# Patient Record
Sex: Male | Born: 1960 | Race: White | Hispanic: No | Marital: Married | Smoking: Former smoker
Health system: Southern US, Community
[De-identification: ages and names within clinical notes are randomized; demographics above are authoritative.]

## PROBLEM LIST (undated history)

## (undated) DIAGNOSIS — K589 Irritable bowel syndrome without diarrhea: Secondary | ICD-10-CM

## (undated) DIAGNOSIS — Z860100 Personal history of colon polyps, unspecified: Secondary | ICD-10-CM

## (undated) DIAGNOSIS — K317 Polyp of stomach and duodenum: Secondary | ICD-10-CM

## (undated) DIAGNOSIS — G4730 Sleep apnea, unspecified: Secondary | ICD-10-CM

## (undated) DIAGNOSIS — K645 Perianal venous thrombosis: Secondary | ICD-10-CM

## (undated) DIAGNOSIS — F32A Depression, unspecified: Secondary | ICD-10-CM

## (undated) DIAGNOSIS — T7840XA Allergy, unspecified, initial encounter: Secondary | ICD-10-CM

## (undated) DIAGNOSIS — Z8489 Family history of other specified conditions: Secondary | ICD-10-CM

## (undated) DIAGNOSIS — R519 Headache, unspecified: Secondary | ICD-10-CM

## (undated) DIAGNOSIS — Z5189 Encounter for other specified aftercare: Secondary | ICD-10-CM

## (undated) DIAGNOSIS — J449 Chronic obstructive pulmonary disease, unspecified: Secondary | ICD-10-CM

## (undated) DIAGNOSIS — J40 Bronchitis, not specified as acute or chronic: Secondary | ICD-10-CM

## (undated) DIAGNOSIS — K5790 Diverticulosis of intestine, part unspecified, without perforation or abscess without bleeding: Secondary | ICD-10-CM

## (undated) DIAGNOSIS — K2289 Other specified disease of esophagus: Secondary | ICD-10-CM

## (undated) DIAGNOSIS — K228 Other specified diseases of esophagus: Secondary | ICD-10-CM

## (undated) DIAGNOSIS — K219 Gastro-esophageal reflux disease without esophagitis: Secondary | ICD-10-CM

## (undated) DIAGNOSIS — K229 Disease of esophagus, unspecified: Secondary | ICD-10-CM

## (undated) DIAGNOSIS — K5792 Diverticulitis of intestine, part unspecified, without perforation or abscess without bleeding: Secondary | ICD-10-CM

## (undated) DIAGNOSIS — F909 Attention-deficit hyperactivity disorder, unspecified type: Secondary | ICD-10-CM

## (undated) DIAGNOSIS — F419 Anxiety disorder, unspecified: Secondary | ICD-10-CM

## (undated) DIAGNOSIS — Z8619 Personal history of other infectious and parasitic diseases: Secondary | ICD-10-CM

## (undated) DIAGNOSIS — K222 Esophageal obstruction: Secondary | ICD-10-CM

## (undated) DIAGNOSIS — F329 Major depressive disorder, single episode, unspecified: Secondary | ICD-10-CM

## (undated) DIAGNOSIS — R51 Headache: Secondary | ICD-10-CM

## (undated) DIAGNOSIS — M1990 Unspecified osteoarthritis, unspecified site: Secondary | ICD-10-CM

## (undated) DIAGNOSIS — J45909 Unspecified asthma, uncomplicated: Secondary | ICD-10-CM

## (undated) HISTORY — DX: Esophageal obstruction: K22.2

## (undated) HISTORY — DX: Polyp of stomach and duodenum: K31.7

## (undated) HISTORY — DX: Personal history of colon polyps, unspecified: Z86.0100

## (undated) HISTORY — DX: Allergy, unspecified, initial encounter: T78.40XA

## (undated) HISTORY — PX: CARDIAC CATHETERIZATION: SHX172

## (undated) HISTORY — DX: Other specified disease of esophagus: K22.89

## (undated) HISTORY — DX: Encounter for other specified aftercare: Z51.89

## (undated) HISTORY — DX: Personal history of other infectious and parasitic diseases: Z86.19

## (undated) HISTORY — DX: Perianal venous thrombosis: K64.5

## (undated) HISTORY — PX: UPPER GASTROINTESTINAL ENDOSCOPY: SHX188

## (undated) HISTORY — PX: BACK SURGERY: SHX140

## (undated) HISTORY — PX: NASAL SINUS SURGERY: SHX719

## (undated) HISTORY — PX: COLECTOMY: SHX59

## (undated) HISTORY — PX: CARPAL TUNNEL RELEASE: SHX101

## (undated) HISTORY — DX: Chronic obstructive pulmonary disease, unspecified: J44.9

## (undated) HISTORY — DX: Personal history of colonic polyps: Z86.010

## (undated) HISTORY — PX: KNEE SURGERY: SHX244

## (undated) HISTORY — DX: Unspecified asthma, uncomplicated: J45.909

---
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
## 1999-08-06 ENCOUNTER — Inpatient Hospital Stay (HOSPITAL_COMMUNITY): Admission: AD | Admit: 1999-08-06 | Discharge: 1999-08-08 | Payer: Self-pay | Admitting: Cardiology

## 2015-02-18 DIAGNOSIS — Z6836 Body mass index (BMI) 36.0-36.9, adult: Secondary | ICD-10-CM | POA: Diagnosis not present

## 2015-02-18 DIAGNOSIS — L237 Allergic contact dermatitis due to plants, except food: Secondary | ICD-10-CM | POA: Diagnosis not present

## 2015-03-03 DIAGNOSIS — M5136 Other intervertebral disc degeneration, lumbar region: Secondary | ICD-10-CM | POA: Diagnosis not present

## 2015-03-03 DIAGNOSIS — M4316 Spondylolisthesis, lumbar region: Secondary | ICD-10-CM | POA: Diagnosis not present

## 2015-03-03 DIAGNOSIS — M4696 Unspecified inflammatory spondylopathy, lumbar region: Secondary | ICD-10-CM | POA: Diagnosis not present

## 2015-03-03 DIAGNOSIS — Z6836 Body mass index (BMI) 36.0-36.9, adult: Secondary | ICD-10-CM | POA: Diagnosis not present

## 2015-03-10 DIAGNOSIS — M4316 Spondylolisthesis, lumbar region: Secondary | ICD-10-CM | POA: Diagnosis not present

## 2015-03-10 DIAGNOSIS — M4806 Spinal stenosis, lumbar region: Secondary | ICD-10-CM | POA: Diagnosis not present

## 2015-03-10 DIAGNOSIS — M5136 Other intervertebral disc degeneration, lumbar region: Secondary | ICD-10-CM | POA: Diagnosis not present

## 2015-03-17 DIAGNOSIS — M545 Low back pain: Secondary | ICD-10-CM | POA: Diagnosis not present

## 2015-03-29 DIAGNOSIS — M5127 Other intervertebral disc displacement, lumbosacral region: Secondary | ICD-10-CM | POA: Diagnosis not present

## 2015-03-29 DIAGNOSIS — M4316 Spondylolisthesis, lumbar region: Secondary | ICD-10-CM | POA: Diagnosis not present

## 2015-04-06 DIAGNOSIS — M7542 Impingement syndrome of left shoulder: Secondary | ICD-10-CM | POA: Diagnosis not present

## 2015-04-10 DIAGNOSIS — J208 Acute bronchitis due to other specified organisms: Secondary | ICD-10-CM | POA: Diagnosis not present

## 2015-04-10 DIAGNOSIS — F419 Anxiety disorder, unspecified: Secondary | ICD-10-CM | POA: Diagnosis not present

## 2015-04-23 DIAGNOSIS — M545 Low back pain: Secondary | ICD-10-CM | POA: Diagnosis not present

## 2015-04-26 DIAGNOSIS — M47896 Other spondylosis, lumbar region: Secondary | ICD-10-CM | POA: Diagnosis not present

## 2015-04-26 DIAGNOSIS — M4316 Spondylolisthesis, lumbar region: Secondary | ICD-10-CM | POA: Diagnosis not present

## 2015-04-26 DIAGNOSIS — M545 Low back pain: Secondary | ICD-10-CM | POA: Diagnosis not present

## 2015-04-26 DIAGNOSIS — M5136 Other intervertebral disc degeneration, lumbar region: Secondary | ICD-10-CM | POA: Diagnosis not present

## 2015-04-30 DIAGNOSIS — M5136 Other intervertebral disc degeneration, lumbar region: Secondary | ICD-10-CM | POA: Diagnosis not present

## 2015-04-30 DIAGNOSIS — M4316 Spondylolisthesis, lumbar region: Secondary | ICD-10-CM | POA: Diagnosis not present

## 2015-04-30 DIAGNOSIS — Z6837 Body mass index (BMI) 37.0-37.9, adult: Secondary | ICD-10-CM | POA: Diagnosis not present

## 2015-05-04 DIAGNOSIS — M545 Low back pain: Secondary | ICD-10-CM | POA: Diagnosis not present

## 2015-05-04 DIAGNOSIS — M5127 Other intervertebral disc displacement, lumbosacral region: Secondary | ICD-10-CM | POA: Diagnosis not present

## 2015-05-04 DIAGNOSIS — M4806 Spinal stenosis, lumbar region: Secondary | ICD-10-CM | POA: Diagnosis not present

## 2015-05-04 DIAGNOSIS — M4316 Spondylolisthesis, lumbar region: Secondary | ICD-10-CM | POA: Diagnosis not present

## 2015-05-06 ENCOUNTER — Other Ambulatory Visit: Payer: Self-pay | Admitting: Specialist

## 2015-05-06 DIAGNOSIS — M48061 Spinal stenosis, lumbar region without neurogenic claudication: Secondary | ICD-10-CM

## 2015-05-12 DIAGNOSIS — S301XXA Contusion of abdominal wall, initial encounter: Secondary | ICD-10-CM | POA: Diagnosis not present

## 2015-05-12 DIAGNOSIS — Z6837 Body mass index (BMI) 37.0-37.9, adult: Secondary | ICD-10-CM | POA: Diagnosis not present

## 2015-05-13 ENCOUNTER — Ambulatory Visit
Admission: RE | Admit: 2015-05-13 | Discharge: 2015-05-13 | Disposition: A | Discharge status: Home / Self Care | Payer: Medicare Other | Source: Ambulatory Visit | Attending: Specialist | Admitting: Specialist

## 2015-05-13 DIAGNOSIS — M48061 Spinal stenosis, lumbar region without neurogenic claudication: Secondary | ICD-10-CM

## 2015-05-13 DIAGNOSIS — M4317 Spondylolisthesis, lumbosacral region: Secondary | ICD-10-CM | POA: Diagnosis not present

## 2015-05-13 DIAGNOSIS — M4806 Spinal stenosis, lumbar region: Secondary | ICD-10-CM | POA: Diagnosis not present

## 2015-05-13 DIAGNOSIS — M5137 Other intervertebral disc degeneration, lumbosacral region: Secondary | ICD-10-CM | POA: Diagnosis not present

## 2015-05-13 DIAGNOSIS — M5126 Other intervertebral disc displacement, lumbar region: Secondary | ICD-10-CM | POA: Diagnosis not present

## 2015-05-13 MED ORDER — ONDANSETRON HCL 4 MG/2ML IJ SOLN
4.0000 mg | Freq: Once | INTRAMUSCULAR | Status: AC
  Administered 2015-05-13: 4 mg via INTRAMUSCULAR

## 2015-05-13 MED ORDER — MEPERIDINE HCL 100 MG/ML IJ SOLN
75.0000 mg | Freq: Once | INTRAMUSCULAR | Status: AC
  Administered 2015-05-13: 75 mg via INTRAMUSCULAR

## 2015-05-13 MED ORDER — IOHEXOL 180 MG/ML  SOLN
15.0000 mL | Freq: Once | INTRAMUSCULAR | Status: AC | PRN
Start: 2015-05-13 — End: 2015-05-13
  Administered 2015-05-13: 15 mL via INTRATHECAL

## 2015-05-13 MED ORDER — DIAZEPAM 5 MG PO TABS
10.0000 mg | ORAL_TABLET | Freq: Once | ORAL | Status: AC
  Administered 2015-05-13: 5 mg via ORAL

## 2015-05-13 NOTE — Discharge Instructions (Signed)

## 2015-05-21 DIAGNOSIS — M5127 Other intervertebral disc displacement, lumbosacral region: Secondary | ICD-10-CM | POA: Diagnosis not present

## 2015-05-21 DIAGNOSIS — M4316 Spondylolisthesis, lumbar region: Secondary | ICD-10-CM | POA: Diagnosis not present

## 2015-06-03 DIAGNOSIS — M4806 Spinal stenosis, lumbar region: Secondary | ICD-10-CM | POA: Diagnosis not present

## 2015-06-03 DIAGNOSIS — M545 Low back pain: Secondary | ICD-10-CM | POA: Diagnosis not present

## 2015-06-03 DIAGNOSIS — M47897 Other spondylosis, lumbosacral region: Secondary | ICD-10-CM | POA: Diagnosis not present

## 2015-06-03 DIAGNOSIS — M47817 Spondylosis without myelopathy or radiculopathy, lumbosacral region: Secondary | ICD-10-CM | POA: Diagnosis not present

## 2015-06-14 DIAGNOSIS — M4316 Spondylolisthesis, lumbar region: Secondary | ICD-10-CM | POA: Diagnosis not present

## 2015-06-14 DIAGNOSIS — M4806 Spinal stenosis, lumbar region: Secondary | ICD-10-CM | POA: Diagnosis not present

## 2015-06-14 DIAGNOSIS — M5127 Other intervertebral disc displacement, lumbosacral region: Secondary | ICD-10-CM | POA: Diagnosis not present

## 2015-06-18 DIAGNOSIS — Z6839 Body mass index (BMI) 39.0-39.9, adult: Secondary | ICD-10-CM | POA: Diagnosis not present

## 2015-06-18 DIAGNOSIS — M4316 Spondylolisthesis, lumbar region: Secondary | ICD-10-CM | POA: Diagnosis not present

## 2015-06-18 DIAGNOSIS — M5136 Other intervertebral disc degeneration, lumbar region: Secondary | ICD-10-CM | POA: Diagnosis not present

## 2015-06-21 DIAGNOSIS — M4316 Spondylolisthesis, lumbar region: Secondary | ICD-10-CM | POA: Diagnosis not present

## 2015-06-21 DIAGNOSIS — M5136 Other intervertebral disc degeneration, lumbar region: Secondary | ICD-10-CM | POA: Diagnosis not present

## 2015-06-23 DIAGNOSIS — Z6839 Body mass index (BMI) 39.0-39.9, adult: Secondary | ICD-10-CM | POA: Diagnosis not present

## 2015-06-23 DIAGNOSIS — M4316 Spondylolisthesis, lumbar region: Secondary | ICD-10-CM | POA: Diagnosis not present

## 2015-06-29 DIAGNOSIS — Z01812 Encounter for preprocedural laboratory examination: Secondary | ICD-10-CM | POA: Diagnosis not present

## 2015-07-12 DIAGNOSIS — Z01810 Encounter for preprocedural cardiovascular examination: Secondary | ICD-10-CM | POA: Diagnosis not present

## 2015-07-12 DIAGNOSIS — M5442 Lumbago with sciatica, left side: Secondary | ICD-10-CM | POA: Diagnosis not present

## 2015-07-12 DIAGNOSIS — M79604 Pain in right leg: Secondary | ICD-10-CM | POA: Diagnosis not present

## 2015-07-12 DIAGNOSIS — M545 Low back pain: Secondary | ICD-10-CM | POA: Diagnosis not present

## 2015-07-12 DIAGNOSIS — M4806 Spinal stenosis, lumbar region: Secondary | ICD-10-CM | POA: Diagnosis not present

## 2015-07-12 DIAGNOSIS — M1612 Unilateral primary osteoarthritis, left hip: Secondary | ICD-10-CM | POA: Diagnosis not present

## 2015-07-12 DIAGNOSIS — M5441 Lumbago with sciatica, right side: Secondary | ICD-10-CM | POA: Diagnosis not present

## 2015-07-12 DIAGNOSIS — M1611 Unilateral primary osteoarthritis, right hip: Secondary | ICD-10-CM | POA: Diagnosis not present

## 2015-07-12 DIAGNOSIS — M5416 Radiculopathy, lumbar region: Secondary | ICD-10-CM | POA: Diagnosis not present

## 2015-07-12 DIAGNOSIS — M4317 Spondylolisthesis, lumbosacral region: Secondary | ICD-10-CM | POA: Diagnosis not present

## 2015-07-12 DIAGNOSIS — G4730 Sleep apnea, unspecified: Secondary | ICD-10-CM | POA: Diagnosis not present

## 2015-07-12 DIAGNOSIS — Z01812 Encounter for preprocedural laboratory examination: Secondary | ICD-10-CM | POA: Diagnosis not present

## 2015-07-12 DIAGNOSIS — Z01818 Encounter for other preprocedural examination: Secondary | ICD-10-CM | POA: Diagnosis not present

## 2015-07-13 DIAGNOSIS — M4317 Spondylolisthesis, lumbosacral region: Secondary | ICD-10-CM | POA: Diagnosis not present

## 2015-07-13 DIAGNOSIS — M5417 Radiculopathy, lumbosacral region: Secondary | ICD-10-CM | POA: Diagnosis not present

## 2015-07-15 DIAGNOSIS — M1288 Other specific arthropathies, not elsewhere classified, other specified site: Secondary | ICD-10-CM | POA: Diagnosis not present

## 2015-07-15 DIAGNOSIS — Z6839 Body mass index (BMI) 39.0-39.9, adult: Secondary | ICD-10-CM | POA: Diagnosis not present

## 2015-07-15 DIAGNOSIS — M4316 Spondylolisthesis, lumbar region: Secondary | ICD-10-CM | POA: Diagnosis not present

## 2015-07-15 DIAGNOSIS — M5136 Other intervertebral disc degeneration, lumbar region: Secondary | ICD-10-CM | POA: Diagnosis not present

## 2015-07-28 DIAGNOSIS — M4316 Spondylolisthesis, lumbar region: Secondary | ICD-10-CM | POA: Diagnosis not present

## 2015-07-28 DIAGNOSIS — M4300 Spondylolysis, site unspecified: Secondary | ICD-10-CM | POA: Diagnosis not present

## 2015-07-28 DIAGNOSIS — M4806 Spinal stenosis, lumbar region: Secondary | ICD-10-CM | POA: Diagnosis not present

## 2015-07-28 DIAGNOSIS — M4310 Spondylolisthesis, site unspecified: Secondary | ICD-10-CM | POA: Diagnosis not present

## 2015-07-28 HISTORY — DX: Morbid (severe) obesity due to excess calories: E66.01

## 2015-08-09 DIAGNOSIS — M47816 Spondylosis without myelopathy or radiculopathy, lumbar region: Secondary | ICD-10-CM | POA: Diagnosis not present

## 2015-08-09 DIAGNOSIS — M4806 Spinal stenosis, lumbar region: Secondary | ICD-10-CM | POA: Diagnosis not present

## 2015-08-09 DIAGNOSIS — M4317 Spondylolisthesis, lumbosacral region: Secondary | ICD-10-CM | POA: Diagnosis not present

## 2015-08-18 DIAGNOSIS — K5792 Diverticulitis of intestine, part unspecified, without perforation or abscess without bleeding: Secondary | ICD-10-CM | POA: Diagnosis not present

## 2015-08-18 DIAGNOSIS — J019 Acute sinusitis, unspecified: Secondary | ICD-10-CM | POA: Diagnosis not present

## 2015-08-18 DIAGNOSIS — R110 Nausea: Secondary | ICD-10-CM | POA: Diagnosis not present

## 2015-08-18 DIAGNOSIS — Z6838 Body mass index (BMI) 38.0-38.9, adult: Secondary | ICD-10-CM | POA: Diagnosis not present

## 2015-08-19 DIAGNOSIS — F419 Anxiety disorder, unspecified: Secondary | ICD-10-CM | POA: Diagnosis not present

## 2015-08-19 DIAGNOSIS — M1990 Unspecified osteoarthritis, unspecified site: Secondary | ICD-10-CM | POA: Diagnosis not present

## 2015-08-19 DIAGNOSIS — J069 Acute upper respiratory infection, unspecified: Secondary | ICD-10-CM | POA: Diagnosis not present

## 2015-08-19 DIAGNOSIS — Z6838 Body mass index (BMI) 38.0-38.9, adult: Secondary | ICD-10-CM | POA: Diagnosis not present

## 2015-08-19 DIAGNOSIS — M4316 Spondylolisthesis, lumbar region: Secondary | ICD-10-CM | POA: Diagnosis not present

## 2015-08-19 DIAGNOSIS — M4317 Spondylolisthesis, lumbosacral region: Secondary | ICD-10-CM | POA: Diagnosis not present

## 2015-08-19 DIAGNOSIS — M4306 Spondylolysis, lumbar region: Secondary | ICD-10-CM | POA: Diagnosis not present

## 2015-08-19 DIAGNOSIS — Z87891 Personal history of nicotine dependence: Secondary | ICD-10-CM | POA: Diagnosis not present

## 2015-08-19 DIAGNOSIS — K219 Gastro-esophageal reflux disease without esophagitis: Secondary | ICD-10-CM | POA: Diagnosis not present

## 2015-09-01 DIAGNOSIS — Z9889 Other specified postprocedural states: Secondary | ICD-10-CM | POA: Diagnosis not present

## 2015-09-01 DIAGNOSIS — Z981 Arthrodesis status: Secondary | ICD-10-CM | POA: Diagnosis not present

## 2015-09-01 DIAGNOSIS — Z79899 Other long term (current) drug therapy: Secondary | ICD-10-CM | POA: Diagnosis not present

## 2015-09-01 DIAGNOSIS — Z09 Encounter for follow-up examination after completed treatment for conditions other than malignant neoplasm: Secondary | ICD-10-CM | POA: Diagnosis not present

## 2015-09-01 DIAGNOSIS — K219 Gastro-esophageal reflux disease without esophagitis: Secondary | ICD-10-CM | POA: Diagnosis not present

## 2015-09-11 DIAGNOSIS — Z23 Encounter for immunization: Secondary | ICD-10-CM | POA: Diagnosis not present

## 2015-09-11 DIAGNOSIS — Z131 Encounter for screening for diabetes mellitus: Secondary | ICD-10-CM | POA: Diagnosis not present

## 2015-09-11 DIAGNOSIS — Z136 Encounter for screening for cardiovascular disorders: Secondary | ICD-10-CM | POA: Diagnosis not present

## 2015-09-11 DIAGNOSIS — Z125 Encounter for screening for malignant neoplasm of prostate: Secondary | ICD-10-CM | POA: Diagnosis not present

## 2015-09-11 DIAGNOSIS — Z0000 Encounter for general adult medical examination without abnormal findings: Secondary | ICD-10-CM | POA: Diagnosis not present

## 2015-09-16 DIAGNOSIS — Z4789 Encounter for other orthopedic aftercare: Secondary | ICD-10-CM | POA: Diagnosis not present

## 2015-09-16 DIAGNOSIS — Z981 Arthrodesis status: Secondary | ICD-10-CM | POA: Diagnosis not present

## 2015-09-16 DIAGNOSIS — M439 Deforming dorsopathy, unspecified: Secondary | ICD-10-CM | POA: Diagnosis not present

## 2015-09-16 DIAGNOSIS — M47816 Spondylosis without myelopathy or radiculopathy, lumbar region: Secondary | ICD-10-CM | POA: Diagnosis not present

## 2015-09-16 DIAGNOSIS — M4806 Spinal stenosis, lumbar region: Secondary | ICD-10-CM | POA: Diagnosis not present

## 2015-09-29 DIAGNOSIS — Z9889 Other specified postprocedural states: Secondary | ICD-10-CM | POA: Diagnosis not present

## 2015-09-29 DIAGNOSIS — M4310 Spondylolisthesis, site unspecified: Secondary | ICD-10-CM | POA: Diagnosis not present

## 2015-09-29 DIAGNOSIS — M4806 Spinal stenosis, lumbar region: Secondary | ICD-10-CM | POA: Diagnosis not present

## 2015-09-29 DIAGNOSIS — Z981 Arthrodesis status: Secondary | ICD-10-CM | POA: Diagnosis not present

## 2015-09-29 DIAGNOSIS — Z79899 Other long term (current) drug therapy: Secondary | ICD-10-CM | POA: Diagnosis not present

## 2015-10-06 DIAGNOSIS — G4733 Obstructive sleep apnea (adult) (pediatric): Secondary | ICD-10-CM | POA: Insufficient documentation

## 2015-10-06 DIAGNOSIS — K219 Gastro-esophageal reflux disease without esophagitis: Secondary | ICD-10-CM | POA: Insufficient documentation

## 2015-10-06 DIAGNOSIS — M4806 Spinal stenosis, lumbar region: Secondary | ICD-10-CM | POA: Diagnosis not present

## 2015-10-06 DIAGNOSIS — J45909 Unspecified asthma, uncomplicated: Secondary | ICD-10-CM | POA: Insufficient documentation

## 2015-10-06 HISTORY — DX: Obstructive sleep apnea (adult) (pediatric): G47.33

## 2015-10-08 DIAGNOSIS — M4806 Spinal stenosis, lumbar region: Secondary | ICD-10-CM | POA: Diagnosis not present

## 2015-10-08 HISTORY — PX: LUMBAR LAMINECTOMY: SHX95

## 2015-10-11 DIAGNOSIS — Z6839 Body mass index (BMI) 39.0-39.9, adult: Secondary | ICD-10-CM | POA: Diagnosis not present

## 2015-10-11 DIAGNOSIS — B356 Tinea cruris: Secondary | ICD-10-CM | POA: Diagnosis not present

## 2015-10-21 DIAGNOSIS — Z9889 Other specified postprocedural states: Secondary | ICD-10-CM | POA: Diagnosis not present

## 2015-10-21 DIAGNOSIS — Z4789 Encounter for other orthopedic aftercare: Secondary | ICD-10-CM | POA: Diagnosis not present

## 2015-10-21 DIAGNOSIS — Z981 Arthrodesis status: Secondary | ICD-10-CM | POA: Diagnosis not present

## 2015-11-01 DIAGNOSIS — Z6839 Body mass index (BMI) 39.0-39.9, adult: Secondary | ICD-10-CM | POA: Diagnosis not present

## 2015-11-01 DIAGNOSIS — E669 Obesity, unspecified: Secondary | ICD-10-CM | POA: Diagnosis not present

## 2015-11-01 DIAGNOSIS — J019 Acute sinusitis, unspecified: Secondary | ICD-10-CM | POA: Diagnosis not present

## 2015-11-01 DIAGNOSIS — Z9889 Other specified postprocedural states: Secondary | ICD-10-CM

## 2015-11-01 DIAGNOSIS — L300 Nummular dermatitis: Secondary | ICD-10-CM | POA: Diagnosis not present

## 2015-11-01 DIAGNOSIS — B86 Scabies: Secondary | ICD-10-CM | POA: Diagnosis not present

## 2015-11-01 DIAGNOSIS — L299 Pruritus, unspecified: Secondary | ICD-10-CM | POA: Diagnosis not present

## 2015-11-01 DIAGNOSIS — R21 Rash and other nonspecific skin eruption: Secondary | ICD-10-CM | POA: Diagnosis not present

## 2015-11-01 HISTORY — DX: Other specified postprocedural states: Z98.890

## 2015-11-24 DIAGNOSIS — Z79899 Other long term (current) drug therapy: Secondary | ICD-10-CM | POA: Diagnosis not present

## 2015-11-24 DIAGNOSIS — Z981 Arthrodesis status: Secondary | ICD-10-CM | POA: Diagnosis not present

## 2015-11-24 DIAGNOSIS — Z9889 Other specified postprocedural states: Secondary | ICD-10-CM | POA: Diagnosis not present

## 2015-11-24 DIAGNOSIS — M4317 Spondylolisthesis, lumbosacral region: Secondary | ICD-10-CM | POA: Diagnosis not present

## 2015-11-24 DIAGNOSIS — W19XXXD Unspecified fall, subsequent encounter: Secondary | ICD-10-CM | POA: Diagnosis not present

## 2015-11-24 DIAGNOSIS — M47896 Other spondylosis, lumbar region: Secondary | ICD-10-CM | POA: Diagnosis not present

## 2015-11-24 DIAGNOSIS — M4806 Spinal stenosis, lumbar region: Secondary | ICD-10-CM | POA: Diagnosis not present

## 2015-11-24 DIAGNOSIS — Z4789 Encounter for other orthopedic aftercare: Secondary | ICD-10-CM | POA: Diagnosis not present

## 2015-12-29 DIAGNOSIS — Z981 Arthrodesis status: Secondary | ICD-10-CM | POA: Diagnosis not present

## 2015-12-29 DIAGNOSIS — M545 Low back pain: Secondary | ICD-10-CM | POA: Diagnosis not present

## 2015-12-29 DIAGNOSIS — G8929 Other chronic pain: Secondary | ICD-10-CM | POA: Diagnosis not present

## 2015-12-29 DIAGNOSIS — Z9889 Other specified postprocedural states: Secondary | ICD-10-CM | POA: Diagnosis not present

## 2015-12-29 DIAGNOSIS — Z791 Long term (current) use of non-steroidal anti-inflammatories (NSAID): Secondary | ICD-10-CM | POA: Diagnosis not present

## 2016-01-10 DIAGNOSIS — G8929 Other chronic pain: Secondary | ICD-10-CM | POA: Insufficient documentation

## 2016-01-10 DIAGNOSIS — M533 Sacrococcygeal disorders, not elsewhere classified: Secondary | ICD-10-CM | POA: Diagnosis not present

## 2016-01-10 DIAGNOSIS — M545 Low back pain, unspecified: Secondary | ICD-10-CM | POA: Insufficient documentation

## 2016-01-10 DIAGNOSIS — W19XXXA Unspecified fall, initial encounter: Secondary | ICD-10-CM

## 2016-01-10 DIAGNOSIS — M461 Sacroiliitis, not elsewhere classified: Secondary | ICD-10-CM | POA: Diagnosis not present

## 2016-01-10 HISTORY — DX: Other chronic pain: G89.29

## 2016-01-10 HISTORY — DX: Low back pain, unspecified: M54.50

## 2016-01-10 HISTORY — DX: Unspecified fall, initial encounter: W19.XXXA

## 2016-01-10 HISTORY — PX: SACROILIAC JOINT INJECTION: SHX2370

## 2016-01-17 DIAGNOSIS — J019 Acute sinusitis, unspecified: Secondary | ICD-10-CM | POA: Diagnosis not present

## 2016-01-20 DIAGNOSIS — Z79899 Other long term (current) drug therapy: Secondary | ICD-10-CM | POA: Diagnosis not present

## 2016-01-20 DIAGNOSIS — G894 Chronic pain syndrome: Secondary | ICD-10-CM | POA: Diagnosis not present

## 2016-01-20 DIAGNOSIS — Z981 Arthrodesis status: Secondary | ICD-10-CM | POA: Diagnosis not present

## 2016-01-20 DIAGNOSIS — M461 Sacroiliitis, not elsewhere classified: Secondary | ICD-10-CM | POA: Diagnosis not present

## 2016-01-20 DIAGNOSIS — M791 Myalgia: Secondary | ICD-10-CM | POA: Diagnosis not present

## 2016-01-20 DIAGNOSIS — Z791 Long term (current) use of non-steroidal anti-inflammatories (NSAID): Secondary | ICD-10-CM | POA: Diagnosis not present

## 2016-02-21 DIAGNOSIS — M4300 Spondylolysis, site unspecified: Secondary | ICD-10-CM | POA: Diagnosis not present

## 2016-02-21 DIAGNOSIS — M4806 Spinal stenosis, lumbar region: Secondary | ICD-10-CM | POA: Diagnosis not present

## 2016-02-21 DIAGNOSIS — Z9889 Other specified postprocedural states: Secondary | ICD-10-CM | POA: Diagnosis not present

## 2016-02-21 DIAGNOSIS — M4310 Spondylolisthesis, site unspecified: Secondary | ICD-10-CM | POA: Diagnosis not present

## 2016-02-21 DIAGNOSIS — Z981 Arthrodesis status: Secondary | ICD-10-CM | POA: Diagnosis not present

## 2016-02-21 DIAGNOSIS — Z79899 Other long term (current) drug therapy: Secondary | ICD-10-CM | POA: Diagnosis not present

## 2016-03-28 DIAGNOSIS — M5442 Lumbago with sciatica, left side: Secondary | ICD-10-CM | POA: Diagnosis not present

## 2016-03-28 DIAGNOSIS — S22080A Wedge compression fracture of T11-T12 vertebra, initial encounter for closed fracture: Secondary | ICD-10-CM | POA: Diagnosis not present

## 2016-03-28 DIAGNOSIS — M4806 Spinal stenosis, lumbar region: Secondary | ICD-10-CM | POA: Diagnosis not present

## 2016-03-28 DIAGNOSIS — M5441 Lumbago with sciatica, right side: Secondary | ICD-10-CM | POA: Diagnosis not present

## 2016-03-28 DIAGNOSIS — M4317 Spondylolisthesis, lumbosacral region: Secondary | ICD-10-CM | POA: Diagnosis not present

## 2016-03-28 DIAGNOSIS — M4300 Spondylolysis, site unspecified: Secondary | ICD-10-CM | POA: Diagnosis not present

## 2016-04-06 DIAGNOSIS — G894 Chronic pain syndrome: Secondary | ICD-10-CM | POA: Diagnosis not present

## 2016-04-06 DIAGNOSIS — M545 Low back pain: Secondary | ICD-10-CM | POA: Diagnosis not present

## 2016-04-06 DIAGNOSIS — Z9889 Other specified postprocedural states: Secondary | ICD-10-CM | POA: Diagnosis not present

## 2016-04-06 DIAGNOSIS — M461 Sacroiliitis, not elsewhere classified: Secondary | ICD-10-CM | POA: Diagnosis not present

## 2016-04-06 DIAGNOSIS — Z981 Arthrodesis status: Secondary | ICD-10-CM | POA: Diagnosis not present

## 2016-04-12 DIAGNOSIS — R112 Nausea with vomiting, unspecified: Secondary | ICD-10-CM | POA: Diagnosis not present

## 2016-04-12 DIAGNOSIS — R079 Chest pain, unspecified: Secondary | ICD-10-CM | POA: Diagnosis not present

## 2016-04-12 DIAGNOSIS — R319 Hematuria, unspecified: Secondary | ICD-10-CM | POA: Diagnosis not present

## 2016-04-12 DIAGNOSIS — R197 Diarrhea, unspecified: Secondary | ICD-10-CM | POA: Diagnosis not present

## 2016-04-13 DIAGNOSIS — K219 Gastro-esophageal reflux disease without esophagitis: Secondary | ICD-10-CM | POA: Diagnosis not present

## 2016-04-13 DIAGNOSIS — J019 Acute sinusitis, unspecified: Secondary | ICD-10-CM | POA: Diagnosis not present

## 2016-04-13 DIAGNOSIS — R1314 Dysphagia, pharyngoesophageal phase: Secondary | ICD-10-CM | POA: Diagnosis not present

## 2016-04-13 DIAGNOSIS — J208 Acute bronchitis due to other specified organisms: Secondary | ICD-10-CM | POA: Diagnosis not present

## 2016-04-14 DIAGNOSIS — R109 Unspecified abdominal pain: Secondary | ICD-10-CM | POA: Diagnosis not present

## 2016-04-14 DIAGNOSIS — T18128A Food in esophagus causing other injury, initial encounter: Secondary | ICD-10-CM | POA: Diagnosis not present

## 2016-04-14 DIAGNOSIS — R112 Nausea with vomiting, unspecified: Secondary | ICD-10-CM | POA: Diagnosis not present

## 2016-04-14 DIAGNOSIS — K222 Esophageal obstruction: Secondary | ICD-10-CM | POA: Diagnosis not present

## 2016-04-14 DIAGNOSIS — R1314 Dysphagia, pharyngoesophageal phase: Secondary | ICD-10-CM | POA: Diagnosis not present

## 2016-04-15 DIAGNOSIS — T18128A Food in esophagus causing other injury, initial encounter: Secondary | ICD-10-CM | POA: Diagnosis not present

## 2016-04-15 DIAGNOSIS — K222 Esophageal obstruction: Secondary | ICD-10-CM | POA: Diagnosis not present

## 2016-04-15 DIAGNOSIS — R1314 Dysphagia, pharyngoesophageal phase: Secondary | ICD-10-CM | POA: Diagnosis not present

## 2016-04-15 DIAGNOSIS — R112 Nausea with vomiting, unspecified: Secondary | ICD-10-CM | POA: Diagnosis not present

## 2016-04-20 DIAGNOSIS — K222 Esophageal obstruction: Secondary | ICD-10-CM | POA: Diagnosis not present

## 2016-04-20 DIAGNOSIS — T18128A Food in esophagus causing other injury, initial encounter: Secondary | ICD-10-CM | POA: Diagnosis not present

## 2016-04-20 DIAGNOSIS — K580 Irritable bowel syndrome with diarrhea: Secondary | ICD-10-CM | POA: Diagnosis not present

## 2016-04-20 DIAGNOSIS — R1314 Dysphagia, pharyngoesophageal phase: Secondary | ICD-10-CM | POA: Diagnosis not present

## 2016-04-25 DIAGNOSIS — R1314 Dysphagia, pharyngoesophageal phase: Secondary | ICD-10-CM | POA: Diagnosis not present

## 2016-04-25 DIAGNOSIS — R1310 Dysphagia, unspecified: Secondary | ICD-10-CM | POA: Diagnosis not present

## 2016-04-25 DIAGNOSIS — K219 Gastro-esophageal reflux disease without esophagitis: Secondary | ICD-10-CM | POA: Diagnosis not present

## 2016-05-01 DIAGNOSIS — M47817 Spondylosis without myelopathy or radiculopathy, lumbosacral region: Secondary | ICD-10-CM | POA: Diagnosis not present

## 2016-05-01 DIAGNOSIS — M461 Sacroiliitis, not elsewhere classified: Secondary | ICD-10-CM | POA: Diagnosis not present

## 2016-05-04 IMAGING — CT CT L SPINE W/ CM
3 series · 9 of 33 positions shown, 11 images · non-contrast
Comparison: MRI 03/10/2015.

CLINICAL DATA: L3-L4 spinal stenosis. Spondylolisthesis. Chronic
back pain. Failure of conservative management.
TECHNIQUE: Contiguous axial images were obtained through the Lumbar spine after
the intrathecal infusion of infusion. Coronal and sagittal
reconstructions were obtained of the axial image sets.

[Series 3: l spine 3.0 b41s · axial · 0.30mm/px · z∈[-102,-102]mm · 1 of 82 slices shown, 2 images]
[im 44/82  soft-tissue]
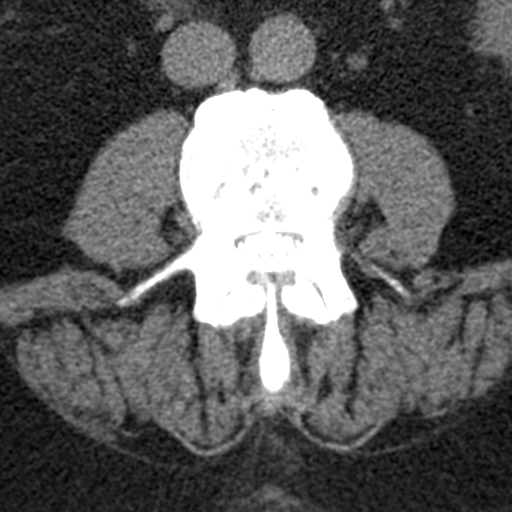
[im 44/82  bone]
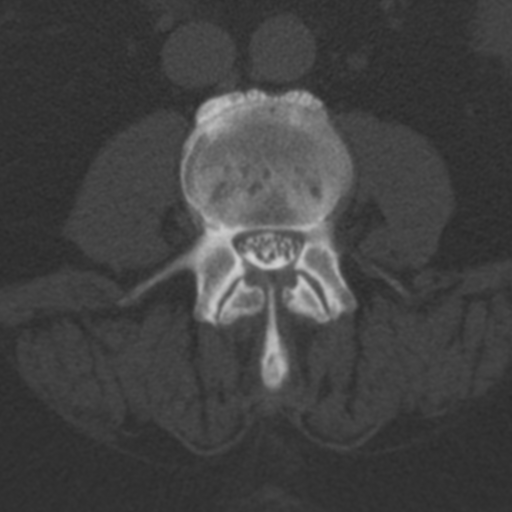

[Series 7: l spine bone cor · coronal · 0.31mm/px · 3 of 47 slices shown]
[im 10/47  bone]
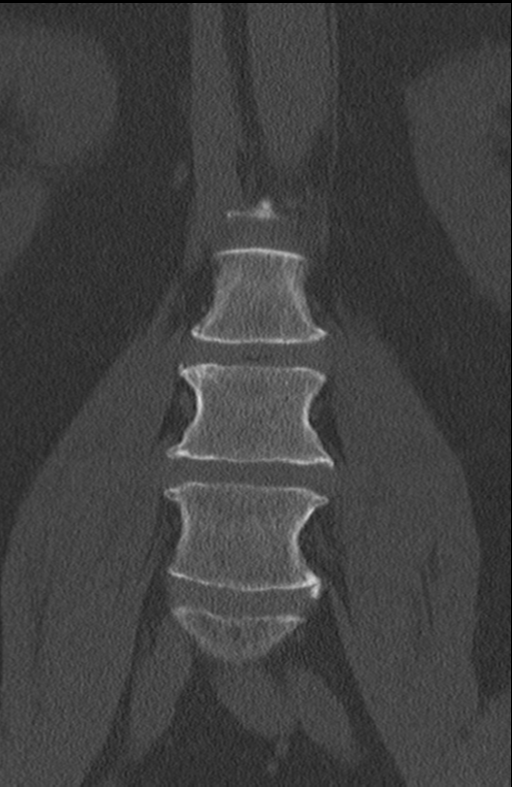
[im 19/47  bone]
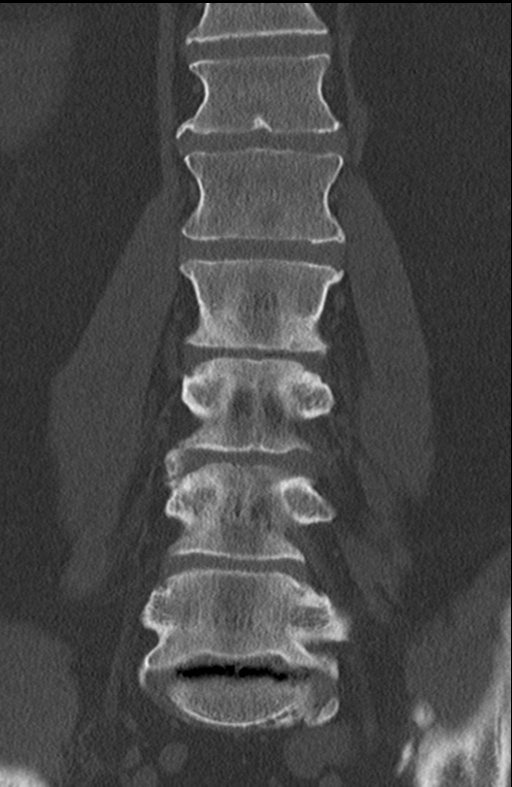
[im 28/47  bone]
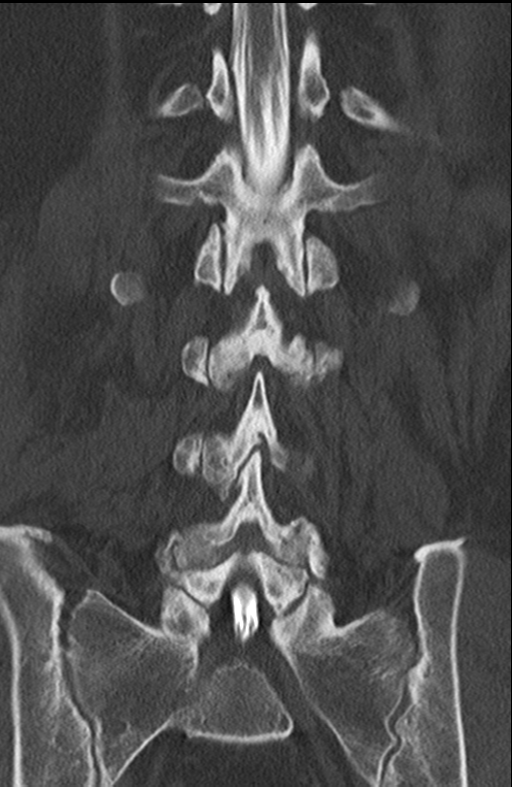

[Series 8: l spine sag bone sag · sagittal · 0.31mm/px · 5 of 50 slices shown, 6 images]
[im 17/50  bone]
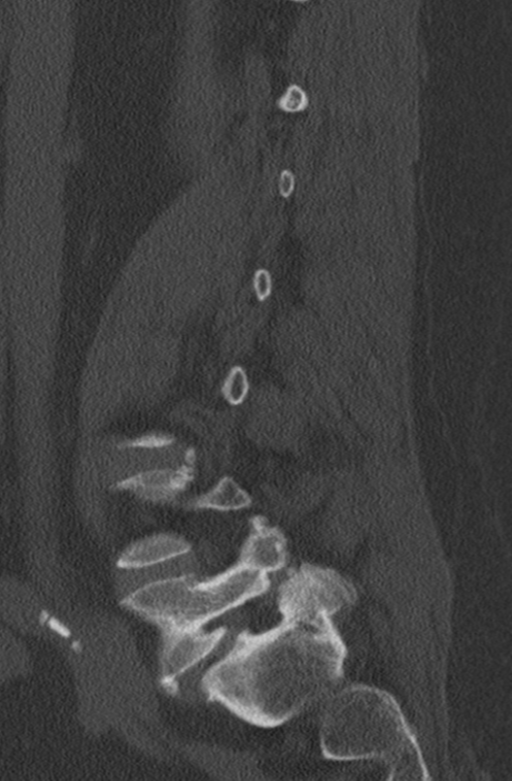
[im 21/50  bone]
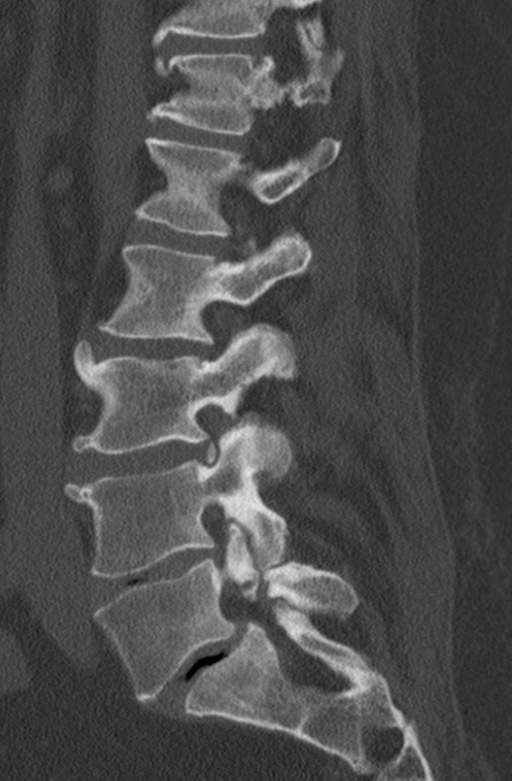
[im 25/50  soft-tissue]
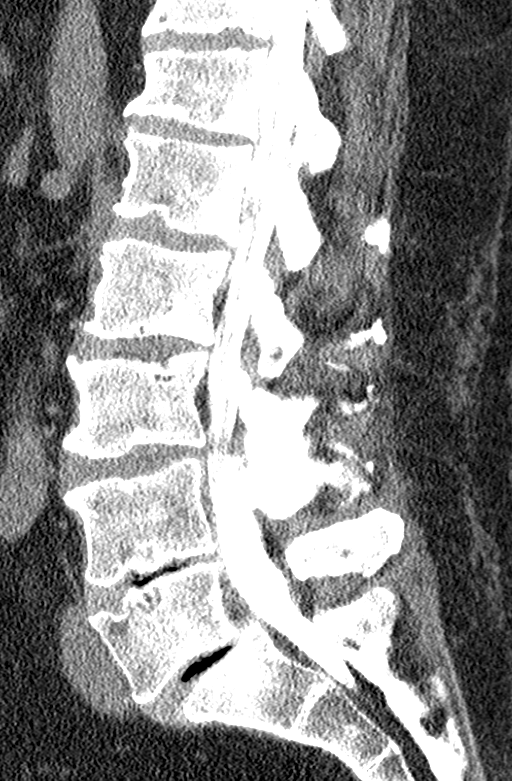
[im 25/50  bone]
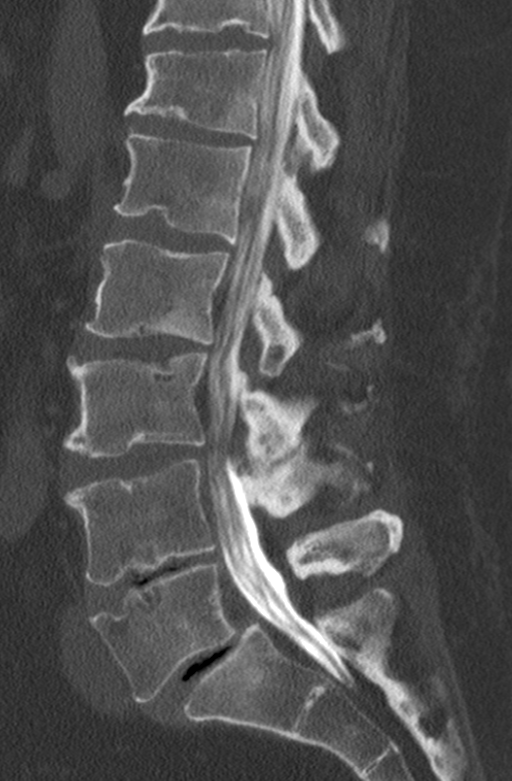
[im 29/50  bone]
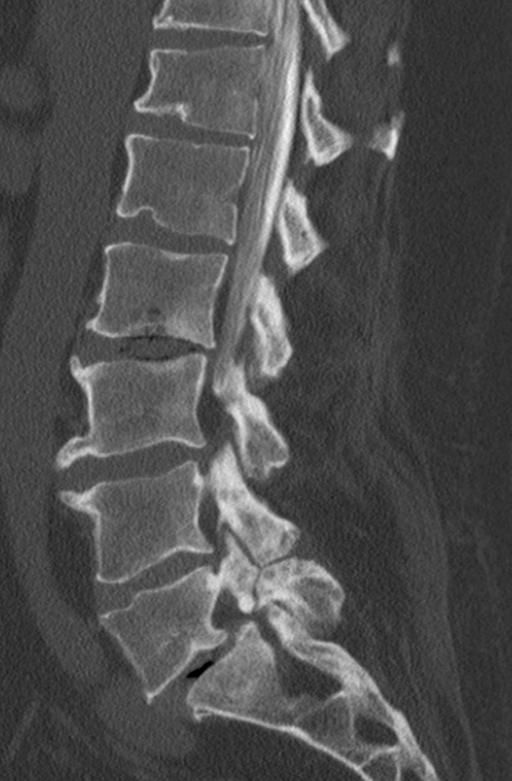
[im 33/50  bone]
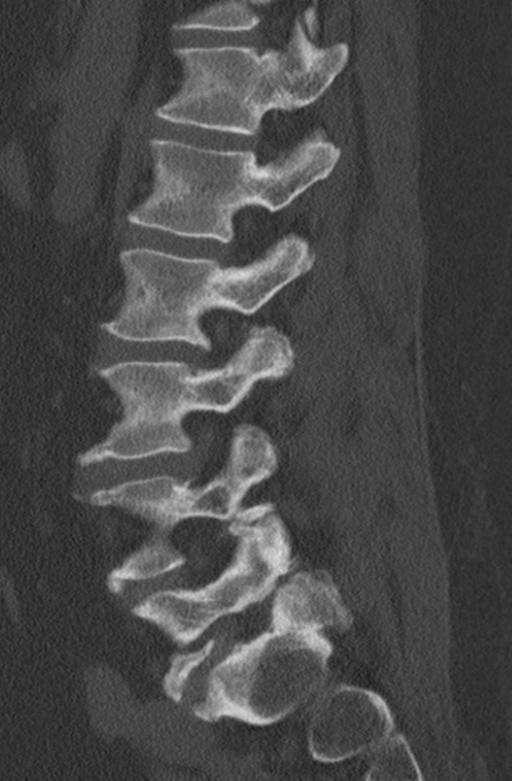

[9 of 33 positions shown; findings below may reference images not displayed]

EXAM:
LUMBAR MYELOGRAM

FLUOROSCOPY TIME:  1 minutes 2 seconds

14 fluoroscopic spot films were obtained.

PROCEDURE:
After thorough discussion of risks and benefits of the procedure
including bleeding, infection, injury to nerves, blood vessels,
adjacent structures as well as headache and CSF leak, written and
oral informed consent was obtained. Consent was obtained by Dr.
Baldev Bevins. Time out form was completed.

Patient was positioned prone on the fluoroscopy table. Local
anesthesia was provided with 1% lidocaine without epinephrine after
prepped and draped in the usual sterile fashion. Puncture was
performed at L1-L2 using a 3 1/2 inch 22 gauge pencil point Kajul
spinal needle via RIGHT paramedian approach. Using a single pass
through the dura, the needle was placed within the thecal sac, with
return of clear CSF. 15 mL of Hmnipaque-LTY was injected into the
thecal sac, with normal opacification of the nerve roots and cauda
equina consistent with free flow within the subarachnoid space.

I personally performed the lumbar puncture and administered the
intrathecal contrast. I also personally supervised acquisition of
the myelogram images.
FINDINGS: LUMBAR MYELOGRAM FINDINGS:

Chronic bilateral L5 pars defects are present. This accounts for
grade I anterolisthesis of L5 on S1. This anterolisthesis does not
significantly change with flexion and extension maneuvers, measuring
11 mm. There is associated L5-S1 degenerative disc disease with disc
space narrowing and collapse.

Trace retrolisthesis of L4 on L5 associated with collapse of the
disc space. Trace retrolisthesis of L2 on L3 is also present
associated with collapse of the disc space. On the lateral images,
central stenosis appears most pronounced at L3-L4, associated with
posterior disc bulging. On the standing upright frontal projections,
there is a mild thoracolumbar curve with the apex at T12-L1.
Leftward and rightward bending radiographs were obtained. Due to the
central stenosis at L3-L4, there is poor opacification of the thecal
sac precluding evaluation of the lateral recess stenosis. On the
myelogram images, there does appear to be at least some element of
lateral recess stenosis at L4-L5, particularly on the LEFT.

CT LUMBAR MYELOGRAM FINDINGS:

Segmentation: The numbering convention used for this exam termed
L5-S1 as the last intervertebral disc space.

Alignment: Anterolisthesis of L5 on S1 is reduced in the supine
position for CT, measuring about 6 mm. This suggests an element of
instability at L5-S1 in the standing upright position.

Vertebrae: No aggressive osseous lesions. Schmorl's nodes. Negative
for compression fracture.

Conus medullaris: Normal termination at L1.

Paraspinal tissues: Atherosclerosis of the aortoiliac system.
Bilateral sacroiliac joint osteoarthritis.

Disc levels:

T12-L1:  Mild disc degeneration and Schmorl's nodes.  No stenosis.

L1-L2: Schmorl's nodes. No protrusion or stenosis. Mild facet
degeneration.

L2-L3: Vacuum disc with disc degeneration and loss of disc height.
Central canal and subarticular zones appear patent. Facet joints
appear within normal limits. Neural foramina appear patent. Small
endplate osteophytes at L 2.

L3-L4: Moderate multifactorial central stenosis. The spinal canal is
congenitally narrow, with 13 mm AP diameter. Posterior ligamentum
flavum redundancy and anterior disc bulging produces the central
stenosis bilateral RIGHT-greater-than- LEFT facet arthrosis is
present with facet hypertrophy contributing to the central stenosis.
Calcified bulging annulus is present in the RIGHT lateral region.
Foraminal stenosis is mild and predominantly due to short pedicles
along with disc bulging.

L4-L5: Severe disc degeneration with vacuum disc. Mild congenitally
narrow spinal canal and LEFT-greater-than-RIGHT facet arthrosis/
hypertrophy. There is crowding of both subarticular zones but no
neural compression. Mild bilateral subarticular stenosis, greater on
the RIGHT when compared to the LEFT. Mild LEFT-greater-than-RIGHT
foraminal stenosis due to bony facet overgrowth.

L5-S1: Degenerated chronic L5 pseudoarthrosis. Hyper trophic changes
at the pars defects produce transverse narrowing of the thecal sac.
There is moderate bilateral foraminal stenosis associated with
anterolisthesis, collapse of the disc and L5 endplate spurring. This
is greater on the LEFT than the RIGHT. Subarticular zones and
descending S1 nerves appear within normal limits.
IMPRESSION: LUMBAR MYELOGRAM IMPRESSION:

1. Technically successful L1-L2 lumbar puncture for lumbar myelogram
with atraumatic spinal needle.
2. 11 mm of grade I anterolisthesis of L4 on L5. No pathologic
motion with flexion and extension. Trace retrolisthesis of L 2 on L3
and L4 on L5.

CT LUMBAR MYELOGRAM IMPRESSION:

1. Chronic L5-S1 pars defects with grade I anterolisthesis.
Decreased anterolisthesis comparing standing upright images to the
supine CT scanning suggesting instability at L5-S1. Bilateral
foraminal stenosis potentially affecting the L5 nerves secondary to
degenerative disc disease and spondylolisthesis.
2. Severe L4-L5 degenerative disc disease with
LEFT-greater-than-RIGHT foraminal stenosis and mild central
stenosis. Congenitally narrow spinal canal with superimposed
degenerative disease.
3. L3-L4 moderate multifactorial central stenosis with congenitally
narrow spinal canal and superimposed degenerative disease.

## 2016-05-05 DIAGNOSIS — A09 Infectious gastroenteritis and colitis, unspecified: Secondary | ICD-10-CM | POA: Diagnosis not present

## 2016-05-05 DIAGNOSIS — R197 Diarrhea, unspecified: Secondary | ICD-10-CM | POA: Diagnosis not present

## 2016-05-18 DIAGNOSIS — K219 Gastro-esophageal reflux disease without esophagitis: Secondary | ICD-10-CM | POA: Diagnosis not present

## 2016-05-18 DIAGNOSIS — K222 Esophageal obstruction: Secondary | ICD-10-CM | POA: Diagnosis not present

## 2016-05-18 DIAGNOSIS — K591 Functional diarrhea: Secondary | ICD-10-CM | POA: Diagnosis not present

## 2016-06-07 DIAGNOSIS — M5412 Radiculopathy, cervical region: Secondary | ICD-10-CM | POA: Diagnosis not present

## 2016-06-08 DIAGNOSIS — K219 Gastro-esophageal reflux disease without esophagitis: Secondary | ICD-10-CM | POA: Diagnosis not present

## 2016-06-08 DIAGNOSIS — R197 Diarrhea, unspecified: Secondary | ICD-10-CM | POA: Diagnosis not present

## 2016-06-08 DIAGNOSIS — K2900 Acute gastritis without bleeding: Secondary | ICD-10-CM | POA: Diagnosis not present

## 2016-06-08 DIAGNOSIS — R1310 Dysphagia, unspecified: Secondary | ICD-10-CM | POA: Diagnosis not present

## 2016-06-08 DIAGNOSIS — K222 Esophageal obstruction: Secondary | ICD-10-CM | POA: Diagnosis not present

## 2016-06-08 DIAGNOSIS — K228 Other specified diseases of esophagus: Secondary | ICD-10-CM | POA: Diagnosis not present

## 2016-06-08 DIAGNOSIS — K591 Functional diarrhea: Secondary | ICD-10-CM | POA: Diagnosis not present

## 2016-06-08 DIAGNOSIS — Z79899 Other long term (current) drug therapy: Secondary | ICD-10-CM | POA: Diagnosis not present

## 2016-06-08 DIAGNOSIS — K2970 Gastritis, unspecified, without bleeding: Secondary | ICD-10-CM | POA: Diagnosis not present

## 2016-06-08 DIAGNOSIS — R1314 Dysphagia, pharyngoesophageal phase: Secondary | ICD-10-CM | POA: Diagnosis not present

## 2016-06-08 HISTORY — PX: ESOPHAGOGASTRODUODENOSCOPY: SHX1529

## 2016-06-09 DIAGNOSIS — M5412 Radiculopathy, cervical region: Secondary | ICD-10-CM | POA: Diagnosis not present

## 2016-06-09 DIAGNOSIS — M47812 Spondylosis without myelopathy or radiculopathy, cervical region: Secondary | ICD-10-CM | POA: Diagnosis not present

## 2016-06-12 DIAGNOSIS — M9902 Segmental and somatic dysfunction of thoracic region: Secondary | ICD-10-CM | POA: Diagnosis not present

## 2016-06-12 DIAGNOSIS — M9901 Segmental and somatic dysfunction of cervical region: Secondary | ICD-10-CM | POA: Diagnosis not present

## 2016-06-12 DIAGNOSIS — M5032 Other cervical disc degeneration, mid-cervical region, unspecified level: Secondary | ICD-10-CM | POA: Diagnosis not present

## 2016-06-12 DIAGNOSIS — M9903 Segmental and somatic dysfunction of lumbar region: Secondary | ICD-10-CM | POA: Diagnosis not present

## 2016-06-12 DIAGNOSIS — S29019A Strain of muscle and tendon of unspecified wall of thorax, initial encounter: Secondary | ICD-10-CM | POA: Diagnosis not present

## 2016-06-13 DIAGNOSIS — M9902 Segmental and somatic dysfunction of thoracic region: Secondary | ICD-10-CM | POA: Diagnosis not present

## 2016-06-13 DIAGNOSIS — M9901 Segmental and somatic dysfunction of cervical region: Secondary | ICD-10-CM | POA: Diagnosis not present

## 2016-06-13 DIAGNOSIS — M5032 Other cervical disc degeneration, mid-cervical region, unspecified level: Secondary | ICD-10-CM | POA: Diagnosis not present

## 2016-06-13 DIAGNOSIS — S29019A Strain of muscle and tendon of unspecified wall of thorax, initial encounter: Secondary | ICD-10-CM | POA: Diagnosis not present

## 2016-06-13 DIAGNOSIS — M9903 Segmental and somatic dysfunction of lumbar region: Secondary | ICD-10-CM | POA: Diagnosis not present

## 2016-06-19 DIAGNOSIS — S29019A Strain of muscle and tendon of unspecified wall of thorax, initial encounter: Secondary | ICD-10-CM | POA: Diagnosis not present

## 2016-06-19 DIAGNOSIS — M9901 Segmental and somatic dysfunction of cervical region: Secondary | ICD-10-CM | POA: Diagnosis not present

## 2016-06-19 DIAGNOSIS — M9903 Segmental and somatic dysfunction of lumbar region: Secondary | ICD-10-CM | POA: Diagnosis not present

## 2016-06-19 DIAGNOSIS — M9902 Segmental and somatic dysfunction of thoracic region: Secondary | ICD-10-CM | POA: Diagnosis not present

## 2016-06-19 DIAGNOSIS — M5032 Other cervical disc degeneration, mid-cervical region, unspecified level: Secondary | ICD-10-CM | POA: Diagnosis not present

## 2016-06-21 DIAGNOSIS — S0100XA Unspecified open wound of scalp, initial encounter: Secondary | ICD-10-CM | POA: Diagnosis not present

## 2016-06-21 DIAGNOSIS — M9903 Segmental and somatic dysfunction of lumbar region: Secondary | ICD-10-CM | POA: Diagnosis not present

## 2016-06-21 DIAGNOSIS — Z23 Encounter for immunization: Secondary | ICD-10-CM | POA: Diagnosis not present

## 2016-06-21 DIAGNOSIS — S29019A Strain of muscle and tendon of unspecified wall of thorax, initial encounter: Secondary | ICD-10-CM | POA: Diagnosis not present

## 2016-06-21 DIAGNOSIS — M5032 Other cervical disc degeneration, mid-cervical region, unspecified level: Secondary | ICD-10-CM | POA: Diagnosis not present

## 2016-06-21 DIAGNOSIS — M9901 Segmental and somatic dysfunction of cervical region: Secondary | ICD-10-CM | POA: Diagnosis not present

## 2016-06-21 DIAGNOSIS — M9902 Segmental and somatic dysfunction of thoracic region: Secondary | ICD-10-CM | POA: Diagnosis not present

## 2016-06-22 DIAGNOSIS — M5032 Other cervical disc degeneration, mid-cervical region, unspecified level: Secondary | ICD-10-CM | POA: Diagnosis not present

## 2016-06-22 DIAGNOSIS — M9903 Segmental and somatic dysfunction of lumbar region: Secondary | ICD-10-CM | POA: Diagnosis not present

## 2016-06-22 DIAGNOSIS — S29019A Strain of muscle and tendon of unspecified wall of thorax, initial encounter: Secondary | ICD-10-CM | POA: Diagnosis not present

## 2016-06-22 DIAGNOSIS — M9902 Segmental and somatic dysfunction of thoracic region: Secondary | ICD-10-CM | POA: Diagnosis not present

## 2016-06-22 DIAGNOSIS — M9901 Segmental and somatic dysfunction of cervical region: Secondary | ICD-10-CM | POA: Diagnosis not present

## 2016-06-26 DIAGNOSIS — M5032 Other cervical disc degeneration, mid-cervical region, unspecified level: Secondary | ICD-10-CM | POA: Diagnosis not present

## 2016-06-26 DIAGNOSIS — M9902 Segmental and somatic dysfunction of thoracic region: Secondary | ICD-10-CM | POA: Diagnosis not present

## 2016-06-26 DIAGNOSIS — S29019A Strain of muscle and tendon of unspecified wall of thorax, initial encounter: Secondary | ICD-10-CM | POA: Diagnosis not present

## 2016-06-26 DIAGNOSIS — M9903 Segmental and somatic dysfunction of lumbar region: Secondary | ICD-10-CM | POA: Diagnosis not present

## 2016-06-26 DIAGNOSIS — M9901 Segmental and somatic dysfunction of cervical region: Secondary | ICD-10-CM | POA: Diagnosis not present

## 2016-07-27 DIAGNOSIS — M533 Sacrococcygeal disorders, not elsewhere classified: Secondary | ICD-10-CM | POA: Insufficient documentation

## 2016-07-27 DIAGNOSIS — Z9889 Other specified postprocedural states: Secondary | ICD-10-CM | POA: Diagnosis not present

## 2016-07-27 DIAGNOSIS — Z981 Arthrodesis status: Secondary | ICD-10-CM | POA: Diagnosis not present

## 2016-07-27 DIAGNOSIS — G8929 Other chronic pain: Secondary | ICD-10-CM | POA: Diagnosis not present

## 2016-07-27 DIAGNOSIS — M545 Low back pain: Secondary | ICD-10-CM | POA: Diagnosis not present

## 2016-07-27 HISTORY — DX: Sacrococcygeal disorders, not elsewhere classified: M53.3

## 2016-07-31 DIAGNOSIS — J019 Acute sinusitis, unspecified: Secondary | ICD-10-CM | POA: Diagnosis not present

## 2016-09-05 DIAGNOSIS — M5032 Other cervical disc degeneration, mid-cervical region, unspecified level: Secondary | ICD-10-CM | POA: Diagnosis not present

## 2016-09-05 DIAGNOSIS — M9903 Segmental and somatic dysfunction of lumbar region: Secondary | ICD-10-CM | POA: Diagnosis not present

## 2016-09-05 DIAGNOSIS — M9902 Segmental and somatic dysfunction of thoracic region: Secondary | ICD-10-CM | POA: Diagnosis not present

## 2016-09-05 DIAGNOSIS — M9901 Segmental and somatic dysfunction of cervical region: Secondary | ICD-10-CM | POA: Diagnosis not present

## 2016-09-05 DIAGNOSIS — S29019A Strain of muscle and tendon of unspecified wall of thorax, initial encounter: Secondary | ICD-10-CM | POA: Diagnosis not present

## 2016-09-06 DIAGNOSIS — M9901 Segmental and somatic dysfunction of cervical region: Secondary | ICD-10-CM | POA: Diagnosis not present

## 2016-09-06 DIAGNOSIS — M9903 Segmental and somatic dysfunction of lumbar region: Secondary | ICD-10-CM | POA: Diagnosis not present

## 2016-09-06 DIAGNOSIS — M5032 Other cervical disc degeneration, mid-cervical region, unspecified level: Secondary | ICD-10-CM | POA: Diagnosis not present

## 2016-09-06 DIAGNOSIS — M9902 Segmental and somatic dysfunction of thoracic region: Secondary | ICD-10-CM | POA: Diagnosis not present

## 2016-09-06 DIAGNOSIS — S29019A Strain of muscle and tendon of unspecified wall of thorax, initial encounter: Secondary | ICD-10-CM | POA: Diagnosis not present

## 2016-09-07 DIAGNOSIS — S29019A Strain of muscle and tendon of unspecified wall of thorax, initial encounter: Secondary | ICD-10-CM | POA: Diagnosis not present

## 2016-09-07 DIAGNOSIS — M5032 Other cervical disc degeneration, mid-cervical region, unspecified level: Secondary | ICD-10-CM | POA: Diagnosis not present

## 2016-09-07 DIAGNOSIS — M9903 Segmental and somatic dysfunction of lumbar region: Secondary | ICD-10-CM | POA: Diagnosis not present

## 2016-09-07 DIAGNOSIS — M9901 Segmental and somatic dysfunction of cervical region: Secondary | ICD-10-CM | POA: Diagnosis not present

## 2016-09-07 DIAGNOSIS — M9902 Segmental and somatic dysfunction of thoracic region: Secondary | ICD-10-CM | POA: Diagnosis not present

## 2016-09-09 DIAGNOSIS — Z136 Encounter for screening for cardiovascular disorders: Secondary | ICD-10-CM | POA: Diagnosis not present

## 2016-09-09 DIAGNOSIS — Z23 Encounter for immunization: Secondary | ICD-10-CM | POA: Diagnosis not present

## 2016-09-09 DIAGNOSIS — Z1389 Encounter for screening for other disorder: Secondary | ICD-10-CM | POA: Diagnosis not present

## 2016-09-09 DIAGNOSIS — Z0000 Encounter for general adult medical examination without abnormal findings: Secondary | ICD-10-CM | POA: Diagnosis not present

## 2016-09-09 DIAGNOSIS — Z131 Encounter for screening for diabetes mellitus: Secondary | ICD-10-CM | POA: Diagnosis not present

## 2016-09-09 DIAGNOSIS — Z125 Encounter for screening for malignant neoplasm of prostate: Secondary | ICD-10-CM | POA: Diagnosis not present

## 2016-09-11 DIAGNOSIS — S29019A Strain of muscle and tendon of unspecified wall of thorax, initial encounter: Secondary | ICD-10-CM | POA: Diagnosis not present

## 2016-09-11 DIAGNOSIS — M9901 Segmental and somatic dysfunction of cervical region: Secondary | ICD-10-CM | POA: Diagnosis not present

## 2016-09-11 DIAGNOSIS — M9903 Segmental and somatic dysfunction of lumbar region: Secondary | ICD-10-CM | POA: Diagnosis not present

## 2016-09-11 DIAGNOSIS — M9902 Segmental and somatic dysfunction of thoracic region: Secondary | ICD-10-CM | POA: Diagnosis not present

## 2016-09-11 DIAGNOSIS — M5032 Other cervical disc degeneration, mid-cervical region, unspecified level: Secondary | ICD-10-CM | POA: Diagnosis not present

## 2016-09-13 DIAGNOSIS — M9903 Segmental and somatic dysfunction of lumbar region: Secondary | ICD-10-CM | POA: Diagnosis not present

## 2016-09-13 DIAGNOSIS — S29019A Strain of muscle and tendon of unspecified wall of thorax, initial encounter: Secondary | ICD-10-CM | POA: Diagnosis not present

## 2016-09-13 DIAGNOSIS — M5032 Other cervical disc degeneration, mid-cervical region, unspecified level: Secondary | ICD-10-CM | POA: Diagnosis not present

## 2016-09-13 DIAGNOSIS — M9902 Segmental and somatic dysfunction of thoracic region: Secondary | ICD-10-CM | POA: Diagnosis not present

## 2016-09-13 DIAGNOSIS — M9901 Segmental and somatic dysfunction of cervical region: Secondary | ICD-10-CM | POA: Diagnosis not present

## 2016-09-14 DIAGNOSIS — M9902 Segmental and somatic dysfunction of thoracic region: Secondary | ICD-10-CM | POA: Diagnosis not present

## 2016-09-14 DIAGNOSIS — S29019A Strain of muscle and tendon of unspecified wall of thorax, initial encounter: Secondary | ICD-10-CM | POA: Diagnosis not present

## 2016-09-14 DIAGNOSIS — M5032 Other cervical disc degeneration, mid-cervical region, unspecified level: Secondary | ICD-10-CM | POA: Diagnosis not present

## 2016-09-14 DIAGNOSIS — M9903 Segmental and somatic dysfunction of lumbar region: Secondary | ICD-10-CM | POA: Diagnosis not present

## 2016-09-14 DIAGNOSIS — M9901 Segmental and somatic dysfunction of cervical region: Secondary | ICD-10-CM | POA: Diagnosis not present

## 2016-09-19 DIAGNOSIS — M5032 Other cervical disc degeneration, mid-cervical region, unspecified level: Secondary | ICD-10-CM | POA: Diagnosis not present

## 2016-09-19 DIAGNOSIS — M9901 Segmental and somatic dysfunction of cervical region: Secondary | ICD-10-CM | POA: Diagnosis not present

## 2016-09-19 DIAGNOSIS — M9903 Segmental and somatic dysfunction of lumbar region: Secondary | ICD-10-CM | POA: Diagnosis not present

## 2016-09-19 DIAGNOSIS — S29019A Strain of muscle and tendon of unspecified wall of thorax, initial encounter: Secondary | ICD-10-CM | POA: Diagnosis not present

## 2016-09-19 DIAGNOSIS — M9902 Segmental and somatic dysfunction of thoracic region: Secondary | ICD-10-CM | POA: Diagnosis not present

## 2016-09-20 DIAGNOSIS — M9903 Segmental and somatic dysfunction of lumbar region: Secondary | ICD-10-CM | POA: Diagnosis not present

## 2016-09-20 DIAGNOSIS — M5032 Other cervical disc degeneration, mid-cervical region, unspecified level: Secondary | ICD-10-CM | POA: Diagnosis not present

## 2016-09-20 DIAGNOSIS — S29019A Strain of muscle and tendon of unspecified wall of thorax, initial encounter: Secondary | ICD-10-CM | POA: Diagnosis not present

## 2016-09-20 DIAGNOSIS — M9902 Segmental and somatic dysfunction of thoracic region: Secondary | ICD-10-CM | POA: Diagnosis not present

## 2016-09-20 DIAGNOSIS — M9901 Segmental and somatic dysfunction of cervical region: Secondary | ICD-10-CM | POA: Diagnosis not present

## 2016-09-23 DIAGNOSIS — M461 Sacroiliitis, not elsewhere classified: Secondary | ICD-10-CM | POA: Diagnosis not present

## 2016-09-23 DIAGNOSIS — M5090 Cervical disc disorder, unspecified, unspecified cervical region: Secondary | ICD-10-CM | POA: Diagnosis not present

## 2016-09-25 DIAGNOSIS — S29019A Strain of muscle and tendon of unspecified wall of thorax, initial encounter: Secondary | ICD-10-CM | POA: Diagnosis not present

## 2016-09-25 DIAGNOSIS — M5032 Other cervical disc degeneration, mid-cervical region, unspecified level: Secondary | ICD-10-CM | POA: Diagnosis not present

## 2016-09-25 DIAGNOSIS — M9902 Segmental and somatic dysfunction of thoracic region: Secondary | ICD-10-CM | POA: Diagnosis not present

## 2016-09-25 DIAGNOSIS — M9901 Segmental and somatic dysfunction of cervical region: Secondary | ICD-10-CM | POA: Diagnosis not present

## 2016-09-25 DIAGNOSIS — M9903 Segmental and somatic dysfunction of lumbar region: Secondary | ICD-10-CM | POA: Diagnosis not present

## 2016-10-02 DIAGNOSIS — M9902 Segmental and somatic dysfunction of thoracic region: Secondary | ICD-10-CM | POA: Diagnosis not present

## 2016-10-02 DIAGNOSIS — M9903 Segmental and somatic dysfunction of lumbar region: Secondary | ICD-10-CM | POA: Diagnosis not present

## 2016-10-02 DIAGNOSIS — M9901 Segmental and somatic dysfunction of cervical region: Secondary | ICD-10-CM | POA: Diagnosis not present

## 2016-10-02 DIAGNOSIS — S29019A Strain of muscle and tendon of unspecified wall of thorax, initial encounter: Secondary | ICD-10-CM | POA: Diagnosis not present

## 2016-10-02 DIAGNOSIS — M5032 Other cervical disc degeneration, mid-cervical region, unspecified level: Secondary | ICD-10-CM | POA: Diagnosis not present

## 2016-10-02 DIAGNOSIS — K5792 Diverticulitis of intestine, part unspecified, without perforation or abscess without bleeding: Secondary | ICD-10-CM | POA: Diagnosis not present

## 2016-10-03 DIAGNOSIS — M9903 Segmental and somatic dysfunction of lumbar region: Secondary | ICD-10-CM | POA: Diagnosis not present

## 2016-10-03 DIAGNOSIS — M9902 Segmental and somatic dysfunction of thoracic region: Secondary | ICD-10-CM | POA: Diagnosis not present

## 2016-10-03 DIAGNOSIS — S29019A Strain of muscle and tendon of unspecified wall of thorax, initial encounter: Secondary | ICD-10-CM | POA: Diagnosis not present

## 2016-10-03 DIAGNOSIS — M5032 Other cervical disc degeneration, mid-cervical region, unspecified level: Secondary | ICD-10-CM | POA: Diagnosis not present

## 2016-10-03 DIAGNOSIS — M9901 Segmental and somatic dysfunction of cervical region: Secondary | ICD-10-CM | POA: Diagnosis not present

## 2016-10-16 DIAGNOSIS — M4692 Unspecified inflammatory spondylopathy, cervical region: Secondary | ICD-10-CM | POA: Diagnosis not present

## 2016-10-16 DIAGNOSIS — M461 Sacroiliitis, not elsewhere classified: Secondary | ICD-10-CM | POA: Diagnosis not present

## 2016-10-16 DIAGNOSIS — M5090 Cervical disc disorder, unspecified, unspecified cervical region: Secondary | ICD-10-CM | POA: Diagnosis not present

## 2016-10-16 DIAGNOSIS — M4316 Spondylolisthesis, lumbar region: Secondary | ICD-10-CM | POA: Diagnosis not present

## 2016-10-19 DIAGNOSIS — M4316 Spondylolisthesis, lumbar region: Secondary | ICD-10-CM | POA: Diagnosis not present

## 2016-11-08 DIAGNOSIS — R030 Elevated blood-pressure reading, without diagnosis of hypertension: Secondary | ICD-10-CM | POA: Diagnosis not present

## 2016-11-08 DIAGNOSIS — M461 Sacroiliitis, not elsewhere classified: Secondary | ICD-10-CM | POA: Diagnosis not present

## 2016-11-08 DIAGNOSIS — M4722 Other spondylosis with radiculopathy, cervical region: Secondary | ICD-10-CM | POA: Diagnosis not present

## 2016-11-08 DIAGNOSIS — M4316 Spondylolisthesis, lumbar region: Secondary | ICD-10-CM | POA: Diagnosis not present

## 2016-11-15 DIAGNOSIS — M6281 Muscle weakness (generalized): Secondary | ICD-10-CM | POA: Diagnosis not present

## 2016-11-15 DIAGNOSIS — R2689 Other abnormalities of gait and mobility: Secondary | ICD-10-CM | POA: Diagnosis not present

## 2016-11-15 DIAGNOSIS — M545 Low back pain: Secondary | ICD-10-CM | POA: Diagnosis not present

## 2016-11-16 ENCOUNTER — Other Ambulatory Visit: Payer: Self-pay | Admitting: Neurological Surgery

## 2016-11-16 DIAGNOSIS — M4316 Spondylolisthesis, lumbar region: Secondary | ICD-10-CM

## 2016-11-20 DIAGNOSIS — J019 Acute sinusitis, unspecified: Secondary | ICD-10-CM | POA: Diagnosis not present

## 2016-11-20 DIAGNOSIS — R2689 Other abnormalities of gait and mobility: Secondary | ICD-10-CM | POA: Diagnosis not present

## 2016-11-20 DIAGNOSIS — M6281 Muscle weakness (generalized): Secondary | ICD-10-CM | POA: Diagnosis not present

## 2016-11-20 DIAGNOSIS — M545 Low back pain: Secondary | ICD-10-CM | POA: Diagnosis not present

## 2016-11-21 DIAGNOSIS — M48061 Spinal stenosis, lumbar region without neurogenic claudication: Secondary | ICD-10-CM | POA: Diagnosis not present

## 2016-11-21 DIAGNOSIS — M4316 Spondylolisthesis, lumbar region: Secondary | ICD-10-CM | POA: Diagnosis not present

## 2016-11-22 DIAGNOSIS — R2689 Other abnormalities of gait and mobility: Secondary | ICD-10-CM | POA: Diagnosis not present

## 2016-11-22 DIAGNOSIS — M6281 Muscle weakness (generalized): Secondary | ICD-10-CM | POA: Diagnosis not present

## 2016-11-22 DIAGNOSIS — M545 Low back pain: Secondary | ICD-10-CM | POA: Diagnosis not present

## 2016-11-23 DIAGNOSIS — R030 Elevated blood-pressure reading, without diagnosis of hypertension: Secondary | ICD-10-CM | POA: Diagnosis not present

## 2016-11-23 DIAGNOSIS — M4722 Other spondylosis with radiculopathy, cervical region: Secondary | ICD-10-CM | POA: Diagnosis not present

## 2016-11-23 DIAGNOSIS — M4316 Spondylolisthesis, lumbar region: Secondary | ICD-10-CM | POA: Diagnosis not present

## 2016-11-27 ENCOUNTER — Other Ambulatory Visit: Payer: Medicare Other

## 2016-11-28 DIAGNOSIS — R2689 Other abnormalities of gait and mobility: Secondary | ICD-10-CM | POA: Diagnosis not present

## 2016-11-28 DIAGNOSIS — M6281 Muscle weakness (generalized): Secondary | ICD-10-CM | POA: Diagnosis not present

## 2016-11-28 DIAGNOSIS — M545 Low back pain: Secondary | ICD-10-CM | POA: Diagnosis not present

## 2016-12-01 DIAGNOSIS — M545 Low back pain: Secondary | ICD-10-CM | POA: Diagnosis not present

## 2016-12-01 DIAGNOSIS — R2689 Other abnormalities of gait and mobility: Secondary | ICD-10-CM | POA: Diagnosis not present

## 2016-12-01 DIAGNOSIS — M6281 Muscle weakness (generalized): Secondary | ICD-10-CM | POA: Diagnosis not present

## 2016-12-05 DIAGNOSIS — R6889 Other general symptoms and signs: Secondary | ICD-10-CM | POA: Diagnosis not present

## 2016-12-09 DIAGNOSIS — K529 Noninfective gastroenteritis and colitis, unspecified: Secondary | ICD-10-CM | POA: Diagnosis not present

## 2016-12-21 DIAGNOSIS — R030 Elevated blood-pressure reading, without diagnosis of hypertension: Secondary | ICD-10-CM | POA: Diagnosis not present

## 2016-12-21 DIAGNOSIS — M4722 Other spondylosis with radiculopathy, cervical region: Secondary | ICD-10-CM | POA: Diagnosis not present

## 2016-12-22 ENCOUNTER — Other Ambulatory Visit: Payer: Self-pay | Admitting: Neurological Surgery

## 2016-12-27 ENCOUNTER — Other Ambulatory Visit (HOSPITAL_COMMUNITY): Payer: Self-pay | Admitting: *Deleted

## 2016-12-27 ENCOUNTER — Inpatient Hospital Stay (HOSPITAL_COMMUNITY)
Admission: RE | Admit: 2016-12-27 | Discharge: 2016-12-27 | Disposition: A | Discharge status: Home / Self Care | Payer: Medicare Other | Source: Ambulatory Visit

## 2016-12-27 NOTE — Pre-Procedure Instructions (Signed)
ELDREDGE VELDHUIZEN  12/27/2016    Your procedure is scheduled on Monday, January 01, 2017 at 2:00 PM.   Report to [facility] Entrance "A" Admitting Office at 12:00 N.   Call this number if you have problems the morning of surgery: [phone]   Any questions prior to day of surgery, please call [phone] between 8 & 4 PM.   Remember:  Do not eat food or drink liquids after midnight Sunday, 12/31/16.  Take these medicines the morning of surgery with A SIP OF WATER:    Do not wear jewelry.  Do not wear lotions, powders or cologne.  Men may shave face and neck.  Do not bring valuables to the hospital.  [facility] is not responsible for any belongings or valuables.  Contacts, dentures or bridgework may not be worn into surgery.  Leave your suitcase in the car.  After surgery it may be brought to your room.  For patients admitted to the hospital, discharge time will be determined by your treatment team.  Patients discharged the day of surgery will not be allowed to drive home.   Special instructions:   - Preparing for Surgery  Before surgery, you can play an important role.  Because skin is not sterile, your skin needs to be as free of germs as possible.  You can reduce the number of germs on you skin by washing with CHG (chlorahexidine gluconate) soap before surgery.  CHG is an antiseptic cleaner which kills germs and bonds with the skin to continue killing germs even after washing.  Please DO NOT use if you have an allergy to CHG or antibacterial soaps.  If your skin becomes reddened/irritated stop using the CHG and inform your nurse when you arrive at Short Stay.  Do not shave (including legs and underarms) for at least 48 hours prior to the first CHG shower.  You may shave your face.  Please follow these instructions carefully:   1.  Shower with CHG Soap the night before surgery and the                    morning of Surgery.  2.  If you choose to wash  your hair, wash your hair first as usual with your       normal shampoo.  3.  After you shampoo, rinse your hair and body thoroughly to remove the shampoo.  4.  Use CHG as you would any other liquid soap.  You can apply chg directly       to the skin and wash gently with scrungie or a clean washcloth.  5.  Apply the CHG Soap to your body ONLY FROM THE NECK DOWN.        Do not use on open wounds or open sores.  Avoid contact with your eyes, ears, mouth and genitals (private parts).  Wash genitals (private parts) with your normal soap.  6.  Wash thoroughly, paying special attention to the area where your surgery        will be performed.  7.  Thoroughly rinse your body with warm water from the neck down.  8.  DO NOT shower/wash with your normal soap after using and rinsing off       the CHG Soap.  9.  Pat yourself dry with a clean towel.            10 .  Wear clean pajamas.  11.  Place clean sheets on your bed the night of your first shower and do not        sleep with pets.  Day of Surgery  Do not apply any lotions the morning of surgery.  Please wear clean clothes to the hospital.   Please read over the fact sheets that you were given.

## 2016-12-28 ENCOUNTER — Encounter (HOSPITAL_COMMUNITY): Payer: Self-pay

## 2016-12-28 ENCOUNTER — Encounter (HOSPITAL_COMMUNITY)
Admission: RE | Admit: 2016-12-28 | Discharge: 2016-12-28 | Disposition: A | Discharge status: Home / Self Care | Payer: Medicare Other | Source: Ambulatory Visit | Attending: Neurological Surgery | Admitting: Neurological Surgery

## 2016-12-28 DIAGNOSIS — Z01818 Encounter for other preprocedural examination: Secondary | ICD-10-CM | POA: Diagnosis not present

## 2016-12-28 HISTORY — DX: Headache, unspecified: R51.9

## 2016-12-28 HISTORY — DX: Irritable bowel syndrome, unspecified: K58.9

## 2016-12-28 HISTORY — DX: Anxiety disorder, unspecified: F41.9

## 2016-12-28 HISTORY — DX: Unspecified osteoarthritis, unspecified site: M19.90

## 2016-12-28 HISTORY — DX: Bronchitis, not specified as acute or chronic: J40

## 2016-12-28 HISTORY — DX: Disease of esophagus, unspecified: K22.9

## 2016-12-28 HISTORY — DX: Family history of other specified conditions: Z84.89

## 2016-12-28 HISTORY — DX: Headache: R51

## 2016-12-28 HISTORY — DX: Depression, unspecified: F32.A

## 2016-12-28 HISTORY — DX: Gastro-esophageal reflux disease without esophagitis: K21.9

## 2016-12-28 HISTORY — DX: Diverticulosis of intestine, part unspecified, without perforation or abscess without bleeding: K57.90

## 2016-12-28 HISTORY — DX: Sleep apnea, unspecified: G47.30

## 2016-12-28 HISTORY — DX: Major depressive disorder, single episode, unspecified: F32.9

## 2016-12-28 LAB — BASIC METABOLIC PANEL
Anion gap: 10 (ref 5–15)
BUN: 8 mg/dL (ref 6–20)
CO2: 25 mmol/L (ref 22–32)
Calcium: 9.8 mg/dL (ref 8.9–10.3)
Chloride: 105 mmol/L (ref 101–111)
Creatinine, Ser: 0.93 mg/dL (ref 0.61–1.24)
GFR calc Af Amer: >60 mL/min (ref >60)
GFR calc non Af Amer: >60 mL/min (ref >60)
Glucose, Bld: 85 mg/dL (ref 65–99)
Potassium: 3.8 mmol/L (ref 3.5–5.1)
Sodium: 140 mmol/L (ref 135–145)

## 2016-12-28 LAB — ABO/RH: ABO/RH(D): O NEG

## 2016-12-28 LAB — CBC
HCT: 49.6 % (ref 39.0–52.0)
Hemoglobin: 16.7 g/dL (ref 13.0–17.0)
MCH: 31 pg (ref 26.0–34.0)
MCHC: 33.7 g/dL (ref 30.0–36.0)
MCV: 92.2 fL (ref 78.0–100.0)
Platelets: 224 10*3/uL (ref 150–400)
RBC: 5.38 MIL/uL (ref 4.22–5.81)
RDW: 14.3 % (ref 11.5–15.5)
WBC: 7.8 10*3/uL (ref 4.0–10.5)

## 2016-12-28 LAB — TYPE AND SCREEN
ABO/RH(D): O NEG
Antibody Screen: NEGATIVE

## 2016-12-28 LAB — SURGICAL PCR SCREEN
MRSA, PCR: POSITIVE — AB
Staphylococcus aureus: POSITIVE — AB

## 2016-12-28 NOTE — Progress Notes (Signed)
   12/28/16 0825  OBSTRUCTIVE SLEEP APNEA  Have you ever been diagnosed with sleep apnea through a sleep study? Yes (tested at [facility] first, and [facility] back in 2016.   PCP is aware he doesn't use machine / mask)  If yes, do you have and use a CPAP or BPAP machine every night? 0 (somewhat non compliant about wearing the mask.  )  Do you snore loudly (loud enough to be heard through closed doors)?  1  Do you often feel tired, fatigued, or sleepy during the daytime (such as falling asleep during driving or talking to someone)? 1  Has anyone observed you stop breathing during your sleep? 0  Do you have, or are you being treated for high blood pressure? 0  BMI more than 35 kg/m2? 1  Age > 50 (1-yes) 1  Neck circumference greater than:Male 16 inches or larger, Male 17inches or larger? 1  Male Gender (Yes=1) 1  Obstructive Sleep Apnea Score 6  Score 5 or greater  Results sent to PCP

## 2016-12-28 NOTE — Progress Notes (Addendum)
PCP is Dr. Lucilla Lame in [place] 11/2016 "long long time ago" (2000) saw Dr. Ron Parker for "irregular" heartbeat. Showed up on EKG.  Not sure of what test done, ? Cath, but test came back negative.  Hasn't been back since and no complaints of cardiac issues now. GI  Is Dr. Lyndel Safe  LOV 09/2016.  Had to have esophagus stretched back in 09/2016 @ [facility].   Had Sleep study done apprx. 10-15 yrs ago @ [facility].  Non compliant with wearing the mask.  PCP is aware.

## 2016-12-29 MED ORDER — CEFAZOLIN SODIUM 10 G IJ SOLR
3.0000 g | INTRAMUSCULAR | Status: AC
  Administered 2017-01-01: 3 g via INTRAVENOUS
  Filled 2016-12-29: qty 3000

## 2017-01-01 ENCOUNTER — Ambulatory Visit (HOSPITAL_COMMUNITY): Payer: Medicare Other | Admitting: Anesthesiology

## 2017-01-01 ENCOUNTER — Encounter (HOSPITAL_COMMUNITY): Payer: Self-pay | Admitting: *Deleted

## 2017-01-01 ENCOUNTER — Ambulatory Visit (HOSPITAL_COMMUNITY): Payer: Medicare Other

## 2017-01-01 ENCOUNTER — Encounter (HOSPITAL_COMMUNITY)
Admission: RE | Disposition: A | Discharge status: Home / Self Care | Payer: Self-pay | Source: Ambulatory Visit | Attending: Neurological Surgery

## 2017-01-01 ENCOUNTER — Ambulatory Visit (HOSPITAL_COMMUNITY)
Admission: RE | Admit: 2017-01-01 | Discharge: 2017-01-02 | Disposition: A | Discharge status: Home / Self Care | Payer: Medicare Other | Source: Ambulatory Visit | Attending: Neurological Surgery | Admitting: Neurological Surgery

## 2017-01-01 DIAGNOSIS — J449 Chronic obstructive pulmonary disease, unspecified: Secondary | ICD-10-CM | POA: Diagnosis not present

## 2017-01-01 DIAGNOSIS — M4322 Fusion of spine, cervical region: Secondary | ICD-10-CM | POA: Diagnosis not present

## 2017-01-01 DIAGNOSIS — G4730 Sleep apnea, unspecified: Secondary | ICD-10-CM | POA: Insufficient documentation

## 2017-01-01 DIAGNOSIS — Z8719 Personal history of other diseases of the digestive system: Secondary | ICD-10-CM | POA: Diagnosis not present

## 2017-01-01 DIAGNOSIS — Z87891 Personal history of nicotine dependence: Secondary | ICD-10-CM | POA: Insufficient documentation

## 2017-01-01 DIAGNOSIS — K589 Irritable bowel syndrome without diarrhea: Secondary | ICD-10-CM | POA: Insufficient documentation

## 2017-01-01 DIAGNOSIS — R51 Headache: Secondary | ICD-10-CM | POA: Diagnosis not present

## 2017-01-01 DIAGNOSIS — M1990 Unspecified osteoarthritis, unspecified site: Secondary | ICD-10-CM | POA: Diagnosis not present

## 2017-01-01 DIAGNOSIS — M50121 Cervical disc disorder at C4-C5 level with radiculopathy: Secondary | ICD-10-CM | POA: Insufficient documentation

## 2017-01-01 DIAGNOSIS — Z6839 Body mass index (BMI) 39.0-39.9, adult: Secondary | ICD-10-CM | POA: Diagnosis not present

## 2017-01-01 DIAGNOSIS — M4722 Other spondylosis with radiculopathy, cervical region: Secondary | ICD-10-CM | POA: Insufficient documentation

## 2017-01-01 DIAGNOSIS — Z419 Encounter for procedure for purposes other than remedying health state, unspecified: Secondary | ICD-10-CM

## 2017-01-01 DIAGNOSIS — F419 Anxiety disorder, unspecified: Secondary | ICD-10-CM | POA: Diagnosis not present

## 2017-01-01 DIAGNOSIS — F329 Major depressive disorder, single episode, unspecified: Secondary | ICD-10-CM | POA: Diagnosis not present

## 2017-01-01 DIAGNOSIS — K219 Gastro-esophageal reflux disease without esophagitis: Secondary | ICD-10-CM | POA: Diagnosis not present

## 2017-01-01 HISTORY — DX: Other spondylosis with radiculopathy, cervical region: M47.22

## 2017-01-01 HISTORY — PX: ANTERIOR CERVICAL DECOMP/DISCECTOMY FUSION: SHX1161

## 2017-01-01 SURGERY — ANTERIOR CERVICAL DECOMPRESSION/DISCECTOMY FUSION 2 LEVELS
Anesthesia: General

## 2017-01-01 MED ORDER — FENTANYL CITRATE (PF) 100 MCG/2ML IJ SOLN
INTRAMUSCULAR | Status: DC | PRN
  Administered 2017-01-01 (×3): 100 ug via INTRAVENOUS
  Administered 2017-01-01: 50 ug via INTRAVENOUS

## 2017-01-01 MED ORDER — LIDOCAINE HCL (CARDIAC) 20 MG/ML IV SOLN
INTRAVENOUS | Status: DC | PRN
  Administered 2017-01-01: 80 mg via INTRATRACHEAL
  Administered 2017-01-01: 20 mg via INTRATRACHEAL

## 2017-01-01 MED ORDER — DICYCLOMINE HCL 20 MG PO TABS: 20.0000 mg | ORAL_TABLET | Freq: Four times a day (QID) | ORAL | Status: DC | PRN

## 2017-01-01 MED ORDER — SUGAMMADEX SODIUM 500 MG/5ML IV SOLN
INTRAVENOUS | Status: AC
  Filled 2017-01-01: qty 5

## 2017-01-01 MED ORDER — METHOCARBAMOL 750 MG PO TABS
750.0000 mg | ORAL_TABLET | Freq: Four times a day (QID) | ORAL | Status: DC
  Administered 2017-01-01 – 2017-01-02 (×2): 750 mg via ORAL
  Filled 2017-01-01 (×2): qty 1

## 2017-01-01 MED ORDER — PANTOPRAZOLE SODIUM 40 MG PO TBEC
40.0000 mg | DELAYED_RELEASE_TABLET | Freq: Every day | ORAL | Status: DC
  Administered 2017-01-02: 40 mg via ORAL
  Filled 2017-01-01: qty 1

## 2017-01-01 MED ORDER — THROMBIN 5000 UNITS EX SOLR
CUTANEOUS | Status: DC | PRN
  Administered 2017-01-01 (×2): 5000 [IU] via TOPICAL

## 2017-01-01 MED ORDER — LIDOCAINE-EPINEPHRINE (PF) 2 %-1:200000 IJ SOLN
INTRAMUSCULAR | Status: DC | PRN
  Administered 2017-01-01: 15 mL via INTRADERMAL

## 2017-01-01 MED ORDER — 0.9 % SODIUM CHLORIDE (POUR BTL) OPTIME
TOPICAL | Status: DC | PRN
  Administered 2017-01-01: 1000 mL

## 2017-01-01 MED ORDER — PHENOL 1.4 % MT LIQD: 1.0000 | OROMUCOSAL | Status: DC | PRN

## 2017-01-01 MED ORDER — HYDROMORPHONE HCL 1 MG/ML IJ SOLN
0.2500 mg | INTRAMUSCULAR | Status: DC | PRN
  Administered 2017-01-01 (×2): 0.25 mg via INTRAVENOUS

## 2017-01-01 MED ORDER — BUPIVACAINE-EPINEPHRINE (PF) 0.5% -1:200000 IJ SOLN
INTRAMUSCULAR | Status: AC
  Filled 2017-01-01: qty 30

## 2017-01-01 MED ORDER — FENTANYL CITRATE (PF) 100 MCG/2ML IJ SOLN
INTRAMUSCULAR | Status: AC
Start: 2017-01-01 — End: 2017-01-01
  Filled 2017-01-01: qty 2

## 2017-01-01 MED ORDER — ACETAMINOPHEN 500 MG PO TABS
1000.0000 mg | ORAL_TABLET | Freq: Four times a day (QID) | ORAL | Status: DC
  Administered 2017-01-01 – 2017-01-02 (×2): 1000 mg via ORAL
  Filled 2017-01-01 (×2): qty 2

## 2017-01-01 MED ORDER — CEFAZOLIN IN D5W 1 GM/50ML IV SOLN
1.0000 g | Freq: Three times a day (TID) | INTRAVENOUS | Status: AC
  Administered 2017-01-01 – 2017-01-02 (×2): 1 g via INTRAVENOUS
  Filled 2017-01-01 (×2): qty 50

## 2017-01-01 MED ORDER — SODIUM CHLORIDE 0.9% FLUSH: 3.0000 mL | INTRAVENOUS | Status: DC | PRN

## 2017-01-01 MED ORDER — HYDROMORPHONE HCL 1 MG/ML IJ SOLN
INTRAMUSCULAR | Status: AC
  Administered 2017-01-01: 0.25 mg via INTRAVENOUS
  Filled 2017-01-01: qty 0.5

## 2017-01-01 MED ORDER — MUPIROCIN 2 % EX OINT: 1.0000 "application " | TOPICAL_OINTMENT | Freq: Two times a day (BID) | CUTANEOUS | Status: DC

## 2017-01-01 MED ORDER — CHLORHEXIDINE GLUCONATE CLOTH 2 % EX PADS: 6.0000 | MEDICATED_PAD | Freq: Once | CUTANEOUS | Status: DC

## 2017-01-01 MED ORDER — FAMOTIDINE 20 MG PO TABS
20.0000 mg | ORAL_TABLET | Freq: Two times a day (BID) | ORAL | Status: DC
  Administered 2017-01-01 – 2017-01-02 (×2): 20 mg via ORAL
  Filled 2017-01-01 (×2): qty 1

## 2017-01-01 MED ORDER — THROMBIN 5000 UNITS EX SOLR
CUTANEOUS | Status: AC
  Filled 2017-01-01: qty 10000

## 2017-01-01 MED ORDER — ROCURONIUM BROMIDE 100 MG/10ML IV SOLN
INTRAVENOUS | Status: DC | PRN
  Administered 2017-01-01: 50 mg via INTRAVENOUS
  Administered 2017-01-01: 20 mg via INTRAVENOUS

## 2017-01-01 MED ORDER — PANTOPRAZOLE SODIUM 40 MG IV SOLR: 40.0000 mg | Freq: Every day | INTRAVENOUS | Status: DC

## 2017-01-01 MED ORDER — ALBUTEROL SULFATE HFA 108 (90 BASE) MCG/ACT IN AERS: 2.0000 | INHALATION_SPRAY | RESPIRATORY_TRACT | Status: DC | PRN

## 2017-01-01 MED ORDER — OXYCODONE HCL 5 MG PO TABS
5.0000 mg | ORAL_TABLET | ORAL | Status: DC | PRN
  Administered 2017-01-02: 10 mg via ORAL
  Filled 2017-01-01: qty 2

## 2017-01-01 MED ORDER — SODIUM CHLORIDE 0.9 % IR SOLN
Status: DC | PRN
  Administered 2017-01-01: 15:00:00

## 2017-01-01 MED ORDER — GABAPENTIN 300 MG PO CAPS
300.0000 mg | ORAL_CAPSULE | Freq: Three times a day (TID) | ORAL | Status: DC
  Administered 2017-01-01 – 2017-01-02 (×2): 300 mg via ORAL
  Filled 2017-01-01 (×2): qty 1

## 2017-01-01 MED ORDER — ROCURONIUM BROMIDE 50 MG/5ML IV SOSY
PREFILLED_SYRINGE | INTRAVENOUS | Status: AC
  Filled 2017-01-01: qty 5

## 2017-01-01 MED ORDER — SODIUM CHLORIDE 0.9 % IV SOLN: INTRAVENOUS | Status: DC

## 2017-01-01 MED ORDER — SODIUM CHLORIDE 0.9% FLUSH: 3.0000 mL | Freq: Two times a day (BID) | INTRAVENOUS | Status: DC

## 2017-01-01 MED ORDER — SENNA 8.6 MG PO TABS
1.0000 | ORAL_TABLET | Freq: Two times a day (BID) | ORAL | Status: DC
  Administered 2017-01-01 – 2017-01-02 (×2): 8.6 mg via ORAL
  Filled 2017-01-01 (×2): qty 1

## 2017-01-01 MED ORDER — CELECOXIB 200 MG PO CAPS
200.0000 mg | ORAL_CAPSULE | Freq: Two times a day (BID) | ORAL | Status: DC
  Administered 2017-01-01 – 2017-01-02 (×2): 200 mg via ORAL
  Filled 2017-01-01 (×2): qty 1

## 2017-01-01 MED ORDER — MUPIROCIN 2 % EX OINT
1.0000 "application " | TOPICAL_OINTMENT | Freq: Two times a day (BID) | CUTANEOUS | Status: DC
  Administered 2017-01-01: 1 via NASAL

## 2017-01-01 MED ORDER — MIDAZOLAM HCL 5 MG/5ML IJ SOLN
INTRAMUSCULAR | Status: DC | PRN
  Administered 2017-01-01: 2 mg via INTRAVENOUS

## 2017-01-01 MED ORDER — BISACODYL 10 MG RE SUPP: 10.0000 mg | Freq: Every day | RECTAL | Status: DC | PRN

## 2017-01-01 MED ORDER — HEMOSTATIC AGENTS (NO CHARGE) OPTIME
TOPICAL | Status: DC | PRN
  Administered 2017-01-01: 1 via TOPICAL

## 2017-01-01 MED ORDER — BUPIVACAINE-EPINEPHRINE (PF) 0.5% -1:200000 IJ SOLN
INTRAMUSCULAR | Status: DC | PRN
  Administered 2017-01-01: 15 mL via PERINEURAL

## 2017-01-01 MED ORDER — OXYCODONE HCL ER 20 MG PO T12A
20.0000 mg | EXTENDED_RELEASE_TABLET | Freq: Two times a day (BID) | ORAL | Status: DC
  Administered 2017-01-01 – 2017-01-02 (×2): 20 mg via ORAL
  Filled 2017-01-01 (×2): qty 1

## 2017-01-01 MED ORDER — THROMBIN 5000 UNITS EX SOLR
CUTANEOUS | Status: AC
  Filled 2017-01-01: qty 5000

## 2017-01-01 MED ORDER — DEXAMETHASONE SODIUM PHOSPHATE 10 MG/ML IJ SOLN
INTRAMUSCULAR | Status: DC | PRN
  Administered 2017-01-01: 10 mg via INTRAVENOUS

## 2017-01-01 MED ORDER — PROPOFOL 10 MG/ML IV BOLUS
INTRAVENOUS | Status: AC
  Filled 2017-01-01: qty 20

## 2017-01-01 MED ORDER — PROPOFOL 10 MG/ML IV BOLUS
INTRAVENOUS | Status: DC | PRN
  Administered 2017-01-01: 150 mg via INTRAVENOUS

## 2017-01-01 MED ORDER — DEXAMETHASONE SODIUM PHOSPHATE 10 MG/ML IJ SOLN
INTRAMUSCULAR | Status: AC
  Filled 2017-01-01: qty 1

## 2017-01-01 MED ORDER — ONDANSETRON HCL 4 MG/2ML IJ SOLN: 4.0000 mg | INTRAMUSCULAR | Status: DC | PRN

## 2017-01-01 MED ORDER — FENTANYL CITRATE (PF) 100 MCG/2ML IJ SOLN
INTRAMUSCULAR | Status: AC
  Filled 2017-01-01: qty 4

## 2017-01-01 MED ORDER — ONDANSETRON HCL 4 MG/2ML IJ SOLN
INTRAMUSCULAR | Status: DC | PRN
  Administered 2017-01-01: 4 mg via INTRAVENOUS

## 2017-01-01 MED ORDER — LIDOCAINE-EPINEPHRINE (PF) 2 %-1:200000 IJ SOLN
INTRAMUSCULAR | Status: AC
  Filled 2017-01-01: qty 20

## 2017-01-01 MED ORDER — DOCUSATE SODIUM 100 MG PO CAPS
100.0000 mg | ORAL_CAPSULE | Freq: Two times a day (BID) | ORAL | Status: DC
  Administered 2017-01-01 – 2017-01-02 (×2): 100 mg via ORAL
  Filled 2017-01-01 (×2): qty 1

## 2017-01-01 MED ORDER — CHLORHEXIDINE GLUCONATE CLOTH 2 % EX PADS
6.0000 | MEDICATED_PAD | Freq: Every day | CUTANEOUS | Status: DC
  Administered 2017-01-02: 6 via TOPICAL

## 2017-01-01 MED ORDER — FLEET ENEMA 7-19 GM/118ML RE ENEM: 1.0000 | ENEMA | Freq: Once | RECTAL | Status: DC | PRN

## 2017-01-01 MED ORDER — ZOLPIDEM TARTRATE 5 MG PO TABS: 5.0000 mg | ORAL_TABLET | Freq: Every evening | ORAL | Status: DC | PRN

## 2017-01-01 MED ORDER — THROMBIN 5000 UNITS EX SOLR
OROMUCOSAL | Status: DC | PRN
  Administered 2017-01-01: 15:00:00 via TOPICAL

## 2017-01-01 MED ORDER — ONDANSETRON HCL 4 MG/2ML IJ SOLN
INTRAMUSCULAR | Status: AC
  Filled 2017-01-01: qty 2

## 2017-01-01 MED ORDER — DEXAMETHASONE SODIUM PHOSPHATE 4 MG/ML IJ SOLN
2.0000 mg | Freq: Three times a day (TID) | INTRAMUSCULAR | Status: DC
  Administered 2017-01-01 – 2017-01-02 (×2): 2 mg via INTRAVENOUS
  Filled 2017-01-01 (×2): qty 1

## 2017-01-01 MED ORDER — ALPRAZOLAM 0.5 MG PO TABS: 1.0000 mg | ORAL_TABLET | Freq: Two times a day (BID) | ORAL | Status: DC | PRN

## 2017-01-01 MED ORDER — SUGAMMADEX SODIUM 200 MG/2ML IV SOLN
INTRAVENOUS | Status: DC | PRN
  Administered 2017-01-01: 260 mg via INTRAVENOUS

## 2017-01-01 MED ORDER — LACTATED RINGERS IV SOLN
INTRAVENOUS | Status: DC
  Administered 2017-01-01 (×2): via INTRAVENOUS

## 2017-01-01 MED ORDER — MIDAZOLAM HCL 2 MG/2ML IJ SOLN
INTRAMUSCULAR | Status: AC
  Filled 2017-01-01: qty 2

## 2017-01-01 MED ORDER — ALBUTEROL SULFATE (2.5 MG/3ML) 0.083% IN NEBU: 2.5000 mg | INHALATION_SOLUTION | RESPIRATORY_TRACT | Status: DC | PRN

## 2017-01-01 MED ORDER — MENTHOL 3 MG MT LOZG: 1.0000 | LOZENGE | OROMUCOSAL | Status: DC | PRN

## 2017-01-01 SURGICAL SUPPLY — 71 items
ADH SKN CLS APL DERMABOND .7 (GAUZE/BANDAGES/DRESSINGS) ×1
BIT DRILL INVIZIA (BIT) IMPLANT
BIT DRILL NEURO 2X3.1 SFT TUCH (MISCELLANEOUS) ×1 IMPLANT
BIT DRILL TRINICA 2.3MM (BIT) IMPLANT
BLADE SURG 11 STRL SS (BLADE) ×2 IMPLANT
BLADE ULTRA TIP 2M (BLADE) IMPLANT
BUR MATCHSTICK NEURO 3.0 LAGG (BURR) ×2 IMPLANT
CANISTER SUCT 3000ML PPV (MISCELLANEOUS) ×2 IMPLANT
CARTRIDGE OIL MAESTRO DRILL (MISCELLANEOUS) ×1 IMPLANT
CHLORAPREP W/TINT 26ML (MISCELLANEOUS) ×2 IMPLANT
DECANTER SPIKE VIAL GLASS SM (MISCELLANEOUS) ×2 IMPLANT
DERMABOND ADVANCED (GAUZE/BANDAGES/DRESSINGS) ×1
DERMABOND ADVANCED .7 DNX12 (GAUZE/BANDAGES/DRESSINGS) ×1 IMPLANT
DIFFUSER DRILL AIR PNEUMATIC (MISCELLANEOUS) ×2 IMPLANT
DRAPE C-ARM 42X72 X-RAY (DRAPES) ×5 IMPLANT
DRAPE HALF SHEET 40X57 (DRAPES) IMPLANT
DRAPE LAPAROTOMY 100X72 PEDS (DRAPES) ×2 IMPLANT
DRAPE MICROSCOPE LEICA (MISCELLANEOUS) ×1 IMPLANT
DRAPE POUCH INSTRU U-SHP 10X18 (DRAPES) ×2 IMPLANT
DRAPE SHEET LG 3/4 BI-LAMINATE (DRAPES) ×2 IMPLANT
DRILL BIT INVIZIA (BIT) ×2
DRILL BIT TRINICA 2.3MM (BIT) ×2
DRILL NEURO 2X3.1 SOFT TOUCH (MISCELLANEOUS) ×2
DRSG OPSITE POSTOP 3X4 (GAUZE/BANDAGES/DRESSINGS) ×1 IMPLANT
ELECT COATED BLADE 2.86 ST (ELECTRODE) ×2 IMPLANT
ELECT REM PT RETURN 9FT ADLT (ELECTROSURGICAL) ×2
ELECTRODE REM PT RTRN 9FT ADLT (ELECTROSURGICAL) ×1 IMPLANT
EVACUATOR 1/8 PVC DRAIN (DRAIN) ×1 IMPLANT
GAUZE SPONGE 4X4 12PLY STRL (GAUZE/BANDAGES/DRESSINGS) IMPLANT
GAUZE SPONGE 4X4 16PLY XRAY LF (GAUZE/BANDAGES/DRESSINGS) IMPLANT
GLOVE BIOGEL PI IND STRL 7.5 (GLOVE) ×1 IMPLANT
GLOVE BIOGEL PI INDICATOR 7.5 (GLOVE) ×1
GLOVE SS BIOGEL STRL SZ 7.5 (GLOVE) ×2 IMPLANT
GLOVE SUPERSENSE BIOGEL SZ 7.5 (GLOVE) ×2
GOWN STRL REUS W/ TWL LRG LVL3 (GOWN DISPOSABLE) ×1 IMPLANT
GOWN STRL REUS W/ TWL XL LVL3 (GOWN DISPOSABLE) ×1 IMPLANT
GOWN STRL REUS W/TWL LRG LVL3 (GOWN DISPOSABLE) ×2
GOWN STRL REUS W/TWL XL LVL3 (GOWN DISPOSABLE) ×2
HEMOSTAT POWDER KIT SURGIFOAM (HEMOSTASIS) ×2 IMPLANT
INTERBODY TM 14X14X6-7DEG ANG (Metal Cage) ×2 IMPLANT
KIT BASIN OR (CUSTOM PROCEDURE TRAY) ×2 IMPLANT
KIT ROOM TURNOVER OR (KITS) ×2 IMPLANT
NDL HYPO 21X1.5 SAFETY (NEEDLE) ×1 IMPLANT
NDL SPNL 22GX3.5 QUINCKE BK (NEEDLE) ×1 IMPLANT
NEEDLE HYPO 21X1.5 SAFETY (NEEDLE) ×2 IMPLANT
NEEDLE SPNL 22GX3.5 QUINCKE BK (NEEDLE) ×2 IMPLANT
NS IRRIG 1000ML POUR BTL (IV SOLUTION) ×2 IMPLANT
OIL CARTRIDGE MAESTRO DRILL (MISCELLANEOUS) ×2
PACK LAMINECTOMY NEURO (CUSTOM PROCEDURE TRAY) ×2 IMPLANT
PAD ARMBOARD 7.5X6 YLW CONV (MISCELLANEOUS) ×4 IMPLANT
PATTIES SURGICAL .5X1.5 (GAUZE/BANDAGES/DRESSINGS) ×2 IMPLANT
PIN DISTRACTION 14MM (PIN) ×2 IMPLANT
PLATE INVIZIA 1 LEV 22MM (Plate) ×1 IMPLANT
PLATE INVIZIA 1 LEV 24MM (Plate) ×1 IMPLANT
PUTTY BONE GRAFT KIT 2.5ML (Bone Implant) ×1 IMPLANT
RUBBERBAND STERILE (MISCELLANEOUS) ×2 IMPLANT
SCREW 16MM (Screw) ×4 IMPLANT
SCREW SPINAL 42.X16MM TITAN (Screw) ×4 IMPLANT
SPONGE INTESTINAL PEANUT (DISPOSABLE) ×3 IMPLANT
SPONGE SURGIFOAM ABS GEL SZ50 (HEMOSTASIS) ×1 IMPLANT
STAPLER VISISTAT 35W (STAPLE) ×2 IMPLANT
STOCKINETTE 6  STRL (DRAPES) ×1
STOCKINETTE 6 STRL (DRAPES) ×1 IMPLANT
SUT STRATAFIX MNCRL+ 3-0 PS-2 (SUTURE) ×2
SUT STRATAFIX MONOCRYL 3-0 (SUTURE) ×2
SUT VIC AB 3-0 SH 8-18 (SUTURE) ×2 IMPLANT
SUTURE STRATFX MNCRL+ 3-0 PS-2 (SUTURE) ×1 IMPLANT
TOWEL OR 17X24 6PK STRL BLUE (TOWEL DISPOSABLE) ×4 IMPLANT
TOWEL OR 17X26 10 PK STRL BLUE (TOWEL DISPOSABLE) ×2 IMPLANT
TUBE CONNECTING 12X1/4 (SUCTIONS) ×2 IMPLANT
WATER STERILE IRR 1000ML POUR (IV SOLUTION) ×2 IMPLANT

## 2017-01-01 NOTE — Anesthesia Procedure Notes (Signed)
Procedure Name: Intubation Date/Time: 01/01/2017 2:03 PM Performed by: Mariea Clonts Pre-anesthesia Checklist: Patient identified, Emergency Drugs available, Suction available and Patient being monitored Patient Re-evaluated:Patient Re-evaluated prior to inductionOxygen Delivery Method: Circle System Utilized Preoxygenation: Pre-oxygenation with 100% oxygen Intubation Type: IV induction Ventilation: Mask ventilation without difficulty Laryngoscope Size: Mac and 4 Grade View: Grade II Tube type: Oral Tube size: 8.0 mm Number of attempts: 1 Airway Equipment and Method: Stylet and Oral airway Placement Confirmation: ETT inserted through vocal cords under direct vision,  positive ETCO2 and breath sounds checked- equal and bilateral Tube secured with: Tape Dental Injury: Teeth and Oropharynx as per pre-operative assessment

## 2017-01-01 NOTE — H&P (Signed)
CC:  No chief complaint on file. Neck and arm pain  HPI: Brandon Gallagher is a 56 y.o. male with cervical spondylosis with radiculopathy presents to neurosurgery clinic for C4-5 and C6-7 anterior discectomy with fusion and plate fixation.  He denies changes since he was last seen in clinic.  PMH: Past Medical History:  Diagnosis Date  . Anxiety   . Arthritis   . Bronchitis    last had back in 2017   Sees no pulmonary md  . Depression   . Diverticulosis    last flare up was in Jan. 2018  Was on course of antibiotics for this  . Esophagus disorder    narrowing of esophagus.  Had to have dilatation 09/2016  . Family history of adverse reaction to anesthesia    son had severe N/V with his knee surgery  . GERD (gastroesophageal reflux disease)   . Headache   . IBS (irritable bowel syndrome)   . Sleep apnea     PSH: Past Surgical History:  Procedure Laterality Date  . BACK SURGERY     x 2    @ [facility]        Last one 09/2015  . CARDIAC CATHETERIZATION     He thinks.   Not really sure where at, thought it was here @ [facility], but its been a LONG time ?2000  . NASAL SINUS SURGERY     HAS HAD 2 OR 3.   LAST ONE 2011, IN [place]  (LEFT NASAL PASSAGE IS NARROWER THAN RIGHT    SH: Social History  Substance Use Topics  . Smoking status: Former Smoker    Packs/day: 1.00    Years: 30.00    Types: Cigarettes    Quit date: 05/12/2000  . Smokeless tobacco: Never Used  . Alcohol use No    MEDS: Prior to Admission medications   Medication Sig Start Date End Date Taking? Authorizing Provider  acetaminophen (TYLENOL) 500 MG tablet Take 1,000 mg by mouth every 6 (six) hours as needed for moderate pain or headache.   Yes Historical Provider, MD  albuterol (PROVENTIL HFA;VENTOLIN HFA) 108 (90 Base) MCG/ACT inhaler Inhale 2 puffs into the lungs every 4 (four) hours as needed for wheezing or shortness of breath.    Yes Historical Provider, MD  ALPRAZolam Duanne Moron) 1 MG tablet Take 1 mg by  mouth 2 (two) times daily as needed for anxiety.  07/24/15  Yes Historical Provider, MD  cyclobenzaprine (FLEXERIL) 10 MG tablet Take 10 mg by mouth 3 (three) times daily as needed for muscle spasms.  10/16/16  Yes Historical Provider, MD  diphenhydramine-acetaminophen (TYLENOL PM) 25-500 MG TABS tablet Take 2 tablets by mouth at bedtime as needed (for sleep).   Yes Historical Provider, MD  mupirocin ointment (BACTROBAN) 2 % Place 1 application into the nose 2 (two) times daily. 12/29/16 01/02/17 Yes Historical Provider, MD  omeprazole (PRILOSEC) 20 MG capsule Take 20 mg by mouth 2 (two) times daily. 11/28/16  Yes Historical Provider, MD  ranitidine (ZANTAC) 150 MG tablet Take 150 mg by mouth 2 (two) times daily.   Yes Historical Provider, MD  dicyclomine (BENTYL) 20 MG tablet Take 20 mg by mouth 4 (four) times daily as needed for spasms.  12/09/16   Historical Provider, MD    ALLERGY: Allergies  Allergen Reactions  . No Known Allergies     ROS: ROS  NEUROLOGIC EXAM: Awake, alert, oriented Motor exam: Upper Extremities Deltoid Bicep Tricep Grip  Right 5/5  5/5 5/5 5/5  Left 5/5 5/5 5/5 5/5   Lower Extremity IP Quad PF DF EHL  Right 5/5 5/5 5/5 5/5 5/5  Left 5/5 5/5 5/5 5/5 5/5   Sensation grossly intact to LT  IMAGING: No new imaging  IMPRESSION: - 56 y.o. male with cervical spondylosis with radiculopathy.  He is neurologically intact.  PLAN: - C4-5 and C6-7 anterior discectomy with fusion and plate fixation - The risks, benefits, and alternatives to the procedure have been reviewed and all questions have been answered.  He wishes to proceed.

## 2017-01-01 NOTE — Transfer of Care (Signed)
Immediate Anesthesia Transfer of Care Note  Patient: Brandon Gallagher  Procedure(s) Performed: Procedure(s) with comments: Cervical four- five and Cervical six- seven Anterior cervical discectomy with fusion and plate fixation (N/A) - C4-5 and C6-7 Anterior cervical discectomy with fusion and plate fixation  Patient Location: PACU  Anesthesia Type:General  Level of Consciousness: awake and patient cooperative  Airway & Oxygen Therapy: Patient Spontanous Breathing and Patient connected to nasal cannula oxygen  Post-op Assessment: Report given to RN and Post -op Vital signs reviewed and stable  Post vital signs: Reviewed and stable  Last Vitals:  Vitals:   01/01/17 1218  BP: 126/71  Pulse: 76  Resp: 20  Temp: 36.9 C    Last Pain:  Vitals:   01/01/17 1226  TempSrc:   PainSc: 3          Complications: No apparent anesthesia complications

## 2017-01-01 NOTE — Op Note (Signed)
01/01/2017  4:17 PM  PATIENT:  Brandon Gallagher  56 y.o. male  PRE-OPERATIVE DIAGNOSIS:  Cervical spondylosis with radiculopathy  POST-OPERATIVE DIAGNOSIS:  Cervical spondylosis with radiculopathy  PROCEDURE:  C4-5 and C6-7 anterior discectomy with fusion and plate fixation; use of machined lordotic interbody spacers  SURGEON:  Aldean Ast, MD  ASSISTANTS: Ferne Reus, PA-C  ANESTHESIA:   General  DRAINS: Medium hemovac   SPECIMEN:  None  INDICATION FOR PROCEDURE: 57 year old male with medically refractory cervical radiculopathy.  I recommended the above operation. Patient understood the risks, benefits, and alternatives and potential outcomes and wished to proceed.  PROCEDURE DETAILS: Patient was brought to the operating room placed under general endotracheal anesthesia. Patient was placed in the supine position on the operating room table. The neck was prepped with betadine and chloraprep and draped in a sterile fashion.     Local anesthestic was injected and a transverse incision was made on the right side of the neck.  Dissection was carried down thru the subcutaneous tissue and the platysma was  elevated, opened, and undermined with Metzenbaum scissors.  Dissection was then carried out thru an avascular plane leaving the sternocleidomastoid, carotid artery, and jugular vein laterally and the trachea and esophagus medially. The ventral aspect of the vertebral column was identified and a localizing x-ray was taken. The C4-5 level was identified. The longus colli muscles were then elevated and the retractor was placed. The annulus was incised and the disc space entered. Discectomy was performed with micro-curettes and pituitary and kerrison rongeurs. I then used the high-speed drill to drill the endplates down to the level of the posterior longitudinal ligament. The operating microscope was draped and brought into the field provided additional magnification, illumination and  visualization. Utilizing microsurgical technique, discectomy was continued posteriorly thru the disc space. Posterior longitudinal ligament was opened with a nerve hook, and then removed along with disc herniation and osteophytes, decompressing the spinal canal and thecal sac. We then continued to remove osteophytic overgrowth and disc material decompressing the neural foramina and exiting nerve roots bilaterally. So by both visualization and palpation we felt we had an adequate decompression of the neural elements. We then measured the height of the intravertebral disc space and selected a 6 millimeter trabecular metal interbody cage packed with morcellized allograft and beta tricalcium phosphate. It was then gently positioned in the intravertebral disc space and countersunk. I then used a 9mm plate and placed two variable angle screws into the C4 body and two fixed angle screws into the C5 body and locked them into position.   I then replaced the retractor at C6-7.  This was confirmed with another intraoperative fluoroscopic image.  The annulus was incised and the disc space entered. Discectomy was performed with micro-curettes and pituitary and kerrison rongeurs. I then used the high-speed drill to drill the endplates down to the level of the posterior longitudinal ligament. The operating microscope was draped and brought into the field provided additional magnification, illumination and visualization. Utilizing microsurgical technique, discectomy was continued posteriorly thru the disc space. Posterior longitudinal ligament was opened with a nerve hook, and then removed along with disc herniation and osteophytes, decompressing the spinal canal and thecal sac. We then continued to remove osteophytic overgrowth and disc material decompressing the neural foramina and exiting nerve roots bilaterally. So by both visualization and palpation we felt we had an adequate decompression of the neural elements. We then  measured the height of the intravertebral disc space  and selected a 6 millimeter trabecular metal interbody cage packed with morcellized allograft and beta tricalcium phosphate. It was then gently positioned in the intravertebral disc space and countersunk. I then used a 3mm plate and placed two variable angle screws into the C4 body and two fixed angle screws into the C5 body and locked them into position.   The wound was irrigated with bacitracin solution, checked for hemostasis which was established and confirmed. Once meticulous hemostasis was achieved, we then proceeded with closure. A medium hemovac drain was placed.  The platysma was closed with interrupted 3-0 undyed Vicryl suture, the subcuticular layer was closed with interrupted 3-0 undyed Vicryl suture. The skin edges were approximated with dermabond. The drapes were removed. A sterile dressing was applied. The patient was then awakened from general anesthesia and transferred to the recovery room in stable condition. At the end of the procedure all sponge, needle and instrument counts were correct.   PATIENT DISPOSITION:  PACU - hemodynamically stable.   Delay start of Pharmacological VTE agent (>24hrs) due to surgical blood loss or risk of bleeding:  yes

## 2017-01-01 NOTE — Anesthesia Preprocedure Evaluation (Addendum)
Anesthesia Evaluation  Patient identified by MRN, date of birth, ID band Patient awake    Reviewed: Allergy & Precautions, NPO status , Patient's Chart, lab work & pertinent test results  History of Anesthesia Complications Negative for: history of anesthetic complications  Airway Mallampati: II  TM Distance: >3 FB Neck ROM: Full    Dental  (+) Edentulous Upper, Edentulous Lower   Pulmonary sleep apnea (does not tolerate CPAP) , COPD (no inhaler needed in months),  COPD inhaler, former smoker,    breath sounds clear to auscultation       Cardiovascular (-) hypertension(-) anginanegative cardio ROS   Rhythm:Regular Rate:Normal     Neuro/Psych  Headaches, Anxiety Depression Poor balance    GI/Hepatic Neg liver ROS, GERD  Medicated and Controlled,  Endo/Other  Morbid obesity  Renal/GU negative Renal ROS     Musculoskeletal  (+) Arthritis , Osteoarthritis,    Abdominal (+) + obese,   Peds  Hematology   Anesthesia Other Findings   Reproductive/Obstetrics                            Anesthesia Physical Anesthesia Plan  ASA: III  Anesthesia Plan: General   Post-op Pain Management:    Induction: Intravenous  Airway Management Planned: Oral ETT  Additional Equipment:   Intra-op Plan:   Post-operative Plan: Extubation in OR  Informed Consent: I have reviewed the patients History and Physical, chart, labs and discussed the procedure including the risks, benefits and alternatives for the proposed anesthesia with the patient or authorized representative who has indicated his/her understanding and acceptance.     Plan Discussed with: CRNA and Surgeon  Anesthesia Plan Comments: (Plan routine monitors, GETA)        Anesthesia Quick Evaluation

## 2017-01-02 ENCOUNTER — Encounter (HOSPITAL_COMMUNITY): Payer: Self-pay | Admitting: Neurological Surgery

## 2017-01-02 DIAGNOSIS — K219 Gastro-esophageal reflux disease without esophagitis: Secondary | ICD-10-CM | POA: Diagnosis not present

## 2017-01-02 DIAGNOSIS — Z8719 Personal history of other diseases of the digestive system: Secondary | ICD-10-CM | POA: Diagnosis not present

## 2017-01-02 DIAGNOSIS — G4730 Sleep apnea, unspecified: Secondary | ICD-10-CM | POA: Diagnosis not present

## 2017-01-02 DIAGNOSIS — M4722 Other spondylosis with radiculopathy, cervical region: Secondary | ICD-10-CM | POA: Diagnosis not present

## 2017-01-02 DIAGNOSIS — K589 Irritable bowel syndrome without diarrhea: Secondary | ICD-10-CM | POA: Diagnosis not present

## 2017-01-02 DIAGNOSIS — M50121 Cervical disc disorder at C4-C5 level with radiculopathy: Secondary | ICD-10-CM | POA: Diagnosis not present

## 2017-01-02 DIAGNOSIS — J449 Chronic obstructive pulmonary disease, unspecified: Secondary | ICD-10-CM | POA: Diagnosis not present

## 2017-01-02 DIAGNOSIS — M1990 Unspecified osteoarthritis, unspecified site: Secondary | ICD-10-CM | POA: Diagnosis not present

## 2017-01-02 DIAGNOSIS — Z87891 Personal history of nicotine dependence: Secondary | ICD-10-CM | POA: Diagnosis not present

## 2017-01-02 MED ORDER — OXYCODONE-ACETAMINOPHEN 7.5-325 MG PO TABS: ORAL_TABLET | ORAL | 0 refills | Status: DC

## 2017-01-02 MED ORDER — CELECOXIB 200 MG PO CAPS: 200.0000 mg | ORAL_CAPSULE | Freq: Two times a day (BID) | ORAL | 1 refills | Status: DC

## 2017-01-02 MED ORDER — METHOCARBAMOL 750 MG PO TABS: 750.0000 mg | ORAL_TABLET | Freq: Four times a day (QID) | ORAL | 1 refills | Status: DC

## 2017-01-02 NOTE — Progress Notes (Signed)
Patient is doing well Some hoarseness Moving arms and legs well Incision looks good D/c today

## 2017-01-02 NOTE — Evaluation (Signed)
Occupational Therapy Evaluation Patient Details Name: Brandon Gallagher MRN: [id number] DOB: 1961/01/10 Today's Date: 01/02/2017    History of Present Illness 56 yo male s/p ACDF C4-5 C6-7  Past Medical History:  Diagnosis Date  . Anxiety   . Arthritis   . Bronchitis    last had back in 2017   Sees no pulmonary md  . Depression   . Diverticulosis    last flare up was in Jan. 2018  Was on course of antibiotics for this  . Esophagus disorder    narrowing of esophagus.  Had to have dilatation 09/2016  . Family history of adverse reaction to anesthesia    son had severe N/V with his knee surgery  . GERD (gastroesophageal reflux disease)   . Headache   . IBS (irritable bowel syndrome)   . Sleep apnea       Clinical Impression   Patient evaluated by Occupational Therapy with no further acute OT needs identified. All education has been completed and the patient has no further questions. See below for any follow-up Occupational Therapy or equipment needs. OT to sign off. Thank you for referral.      Follow Up Recommendations  No OT follow up    Equipment Recommendations  None recommended by OT    Recommendations for Other Services       Precautions / Restrictions Precautions Precautions: Cervical Precaution Comments: handout provided and reviewed for adls.      Mobility Bed Mobility Overal bed mobility: Independent                Transfers Overall transfer level: Needs assistance Equipment used: Rolling walker (2 wheeled) Transfers: Sit to/from Stand Sit to Stand: Supervision              Balance                                            ADL Overall ADL's : Needs assistance/impaired Eating/Feeding: Independent   Grooming: Wash/dry hands;Modified independent   Upper Body Bathing: Supervision/ safety   Lower Body Bathing: Supervison/ safety       Lower Body Dressing: Supervision/safety;Sit to/from stand Lower Body Dressing  Details (indicate cue type and reason): able to cross bil LE and don socks Toilet Transfer: Modified Independent       Tub/ Shower Transfer: Walk-in shower;Supervision/safety   Functional mobility during ADLs: Supervision/safety;Rolling walker General ADL Comments: pt will have wife (A) in the evenings   Cervical precautions ( handout provided):  educated on oral care using cups, washing face with cloth, never to wash directly on incision site, avoid neck rotation flexion and extension, positioning with pillows in chair for bil UE, sleeping positioning, avoiding pushing / pulling with bil UE.  Pt educated on need to notify doctor / RN of swallowing changes or choking..    Vision Baseline Vision/History: No visual deficits       Perception     Praxis      Pertinent Vitals/Pain Pain Assessment: Faces Faces Pain Scale: Hurts little more Pain Location: throat Pain Descriptors / Indicators: Sore Pain Intervention(s): Monitored during session;Premedicated before session;Repositioned     Hand Dominance Right   Extremity/Trunk Assessment Upper Extremity Assessment Upper Extremity Assessment: Overall WFL for tasks assessed   Lower Extremity Assessment Lower Extremity Assessment: Defer to PT evaluation   Cervical / Trunk Assessment Cervical /  Trunk Assessment: Other exceptions (s/p surg)   Communication Communication Communication: No difficulties   Cognition Arousal/Alertness: Awake/alert Behavior During Therapy: WFL for tasks assessed/performed Overall Cognitive Status: Within Functional Limits for tasks assessed                     General Comments       Exercises       Shoulder Instructions      Home Living Family/patient expects to be discharged to:: Private residence Living Arrangements: Spouse/significant other;Children Available Help at Discharge: Family Type of Home: House Home Access: Stairs to enter Technical brewer of Steps: 2 Entrance  Stairs-Rails: None Home Layout: One level     Bathroom Shower/Tub: Occupational psychologist: [place]: Environmental consultant - 2 wheels          Prior Functioning/Environment Level of Independence: Independent                 OT Problem List:        OT Treatment/Interventions:      OT Goals(Current goals can be found in the care plan section) Acute Rehab OT Goals Patient Stated Goal: to get back to the [facility] to do exercise in the pool OT Goal Formulation: With patient Time For Goal Achievement: 01/16/17 Potential to Achieve Goals: Good  OT Frequency:     Barriers to D/C:            Co-evaluation              End of Session Equipment Utilized During Treatment: Rolling walker Nurse Communication: Mobility status;Precautions  Activity Tolerance: Patient tolerated treatment well Patient left: in chair;with call bell/phone within reach  OT Visit Diagnosis: Muscle weakness (generalized) (M62.81)                ADL either performed or assessed with clinical judgement  Time: 6644-0347 OT Time Calculation (min): 17 min Charges:  OT General Charges $OT Visit: 1 Procedure OT Evaluation $OT Eval Moderate Complexity: 1 Procedure G-Codes: OT G-codes **NOT FOR INPATIENT CLASS** Functional Assessment Tool Used: AM-PAC 6 Clicks Daily Activity Functional Assessment Tool Used: self care Functional Limitation: Self care Self Care Current Status (Q2595): 0 percent impaired, limited or restricted Self Care Goal Status (G3875): 0 percent impaired, limited or restricted Self Care Discharge Status (I4332): 0 percent impaired, limited or restricted    Jeri Modena   OTR/L Pager: [phone] Office: [phone] .   Parke Poisson B 01/02/2017, 8:43 AM

## 2017-01-02 NOTE — Evaluation (Signed)
Physical Therapy Evaluation Patient Details Name: Brandon Gallagher MRN: [id number] DOB: August 10, 1961 Today's Date: 01/02/2017   History of Present Illness  56 yo male s/p ACDF C4-5 C6-7. PMH includes arthritis  Clinical Impression  Patient is s/p above surgery resulting in the deficits listed below (see PT Problem List). Prior to admission, pt was independent with all functional mobility tasks. Upon evaluation, pt demonstrated some decreased steadiness with functional mobility tasks and required use of RW and assist. Recommending RW for home use. No further up PT required. Patient will benefit from skilled PT to increase their independence and safety with mobility (while adhering to their precautions) to allow discharge to the venue listed below.     Follow Up Recommendations No PT follow up;Supervision - Intermittent    Equipment Recommendations  Rolling walker with 5" wheels    Recommendations for Other Services       Precautions / Restrictions Precautions Precautions: Cervical Precaution Comments: Handout provided by OT. Reviewed precautions with pt as he was unable to remember all precautions.  Restrictions Weight Bearing Restrictions: No      Mobility  Bed Mobility Overal bed mobility: Independent             General bed mobility comments: Pt in chair upon entry  Transfers Overall transfer level: Needs assistance Equipment used: Rolling walker (2 wheeled) Transfers: Sit to/from Stand Sit to Stand: Supervision         General transfer comment: Supervision for safety  Ambulation/Gait Ambulation/Gait assistance: Supervision Ambulation Distance (Feet): 175 Feet Assistive device: Rolling walker (2 wheeled) Gait Pattern/deviations: Step-through pattern;Wide base of support;Decreased stride length Gait velocity: Decreased Gait velocity interpretation: Below normal speed for age/gender General Gait Details: Pt demonstrating slow gait with deficits above. Cues for  appropriate posture during gait.   Stairs Stairs: Yes Stairs assistance: Min assist Stair Management: No rails;Forwards;Step to pattern (Hand held assist) Number of Stairs: 2 General stair comments: Educated on appropriate technique for ascending and descending stairs. Educated about level of assist required when navigating stairs at home for safety.   Wheelchair Mobility    Modified Rankin (Stroke Patients Only)       Balance Overall balance assessment: Needs assistance Sitting-balance support: No upper extremity supported;Feet supported Sitting balance-Leahy Scale: Fair     Standing balance support: Bilateral upper extremity supported;During functional activity Standing balance-Leahy Scale: Good Standing balance comment: Use of BUE on RW to maintain balance                             Pertinent Vitals/Pain Pain Assessment: Faces Faces Pain Scale: Hurts a little bit Pain Location: neck Pain Descriptors / Indicators: Sore Pain Intervention(s): Limited activity within patient's tolerance;Monitored during session;Repositioned    Home Living Family/patient expects to be discharged to:: Private residence Living Arrangements: Spouse/significant other;Children Available Help at Discharge: Family Type of Home: House Home Access: Stairs to enter Entrance Stairs-Rails: None Technical brewer of Steps: 2 Home Layout: One level Home Equipment: None      Prior Function Level of Independence: Independent               Hand Dominance   Dominant Hand: Right    Extremity/Trunk Assessment   Upper Extremity Assessment Upper Extremity Assessment: Defer to OT evaluation    Lower Extremity Assessment Lower Extremity Assessment: Overall WFL for tasks assessed    Cervical / Trunk Assessment Cervical / Trunk Assessment: Other exceptions (s/p surgery)  Communication  Communication: No difficulties  Cognition Arousal/Alertness: Awake/alert Behavior  During Therapy: WFL for tasks assessed/performed Overall Cognitive Status: Within Functional Limits for tasks assessed                      General Comments General comments (skin integrity, edema, etc.): Pt asked about return to gym and number of appropriate recommendations. Educated about appropriate walking exercise at home and to discuss other aquatic exercise with MD.     Exercises     Assessment/Plan    PT Assessment Patient needs continued PT services  PT Problem List Decreased balance;Decreased mobility;Decreased knowledge of use of DME;Decreased knowledge of precautions       PT Treatment Interventions DME instruction;Gait training;Stair training;Functional mobility training;Therapeutic activities;Therapeutic exercise;Balance training;Neuromuscular re-education;Patient/family education    PT Goals (Current goals can be found in the Care Plan section)  Acute Rehab PT Goals Patient Stated Goal: to get back to the [facility] to do exercise in the pool PT Goal Formulation: With patient Time For Goal Achievement: 01/09/17 Potential to Achieve Goals: Good    Frequency Min 5X/week   Barriers to discharge        Co-evaluation               End of Session Equipment Utilized During Treatment: Gait belt Activity Tolerance: Patient tolerated treatment well Patient left: in chair;with call bell/phone within reach Nurse Communication: Mobility status PT Visit Diagnosis: Muscle weakness (generalized) (M62.81);Unsteadiness on feet (R26.81)    Functional Assessment Tool Used: AM-PAC 6 Clicks Basic Mobility;Clinical judgement Functional Limitation: Mobility: Walking and moving around Mobility: Walking and Moving Around Current Status (K5997): At least 20 percent but less than 40 percent impaired, limited or restricted Mobility: Walking and Moving Around Goal Status [phone]): At least 1 percent but less than 20 percent impaired, limited or restricted    Time:  0936-0952 PT Time Calculation (min) (ACUTE ONLY): 16 min   Charges:   PT Evaluation $PT Eval Low Complexity: 1 Procedure     PT G Codes:   PT G-Codes **NOT FOR INPATIENT CLASS** Functional Assessment Tool Used: AM-PAC 6 Clicks Basic Mobility;Clinical judgement Functional Limitation: Mobility: Walking and moving around Mobility: Walking and Moving Around Current Status (L9532): At least 20 percent but less than 40 percent impaired, limited or restricted Mobility: Walking and Moving Around Goal Status [phone]): At least 1 percent but less than 20 percent impaired, limited or restricted     Mamie Levers 01/02/2017, 10:25 AM  Nicky Pugh, PT, DPT  Acute Rehabilitation Services  Pager: [phone]

## 2017-01-02 NOTE — Discharge Summary (Signed)
Date of Admission: 01/01/2017  Date of Discharge: 01/02/17  PRE-OPERATIVE DIAGNOSIS:  Cervical spondylosis with radiculopathy  POST-OPERATIVE DIAGNOSIS:  Cervical spondylosis with radiculopathy  PROCEDURE:  C4-5 and C6-7 anterior discectomy with fusion and plate fixation; use of machined lordotic interbody spacers  Attending: Kevan Ny Ditty, MD  Hospital Course:  The patient was admitted for the above listed operation and had an uncomplicated post-operative course.  They were discharged in stable condition. CN grossly intact. Able to move extremities. BUE and BLE 5/5 strength. Up and ambulating with physical therapy. Tolerating po. Pain well controlled. Denies radicular symptoms or new neurological symptoms. Overall, feels well and ready to go home.   Follow up: 3 weeks  Allergies as of 01/02/2017      Reactions   No Known Allergies       Medication List    STOP taking these medications   cyclobenzaprine 10 MG tablet Commonly known as:  FLEXERIL     TAKE these medications   acetaminophen 500 MG tablet Commonly known as:  TYLENOL Take 1,000 mg by mouth every 6 (six) hours as needed for moderate pain or headache.   albuterol 108 (90 Base) MCG/ACT inhaler Commonly known as:  PROVENTIL HFA;VENTOLIN HFA Inhale 2 puffs into the lungs every 4 (four) hours as needed for wheezing or shortness of breath.   ALPRAZolam 1 MG tablet Commonly known as:  XANAX Take 1 mg by mouth 2 (two) times daily as needed for anxiety.   celecoxib 200 MG capsule Commonly known as:  CELEBREX Take 1 capsule (200 mg total) by mouth 2 (two) times daily.   dicyclomine 20 MG tablet Commonly known as:  BENTYL Take 20 mg by mouth 4 (four) times daily as needed for spasms.   diphenhydramine-acetaminophen 25-500 MG Tabs tablet Commonly known as:  TYLENOL PM Take 2 tablets by mouth at bedtime as needed (for sleep).   methocarbamol 750 MG tablet Commonly known as:  ROBAXIN Take 1 tablet (750 mg  total) by mouth 4 (four) times daily.   mupirocin ointment 2 % Commonly known as:  BACTROBAN Place 1 application into the nose 2 (two) times daily.   omeprazole 20 MG capsule Commonly known as:  PRILOSEC Take 20 mg by mouth 2 (two) times daily.   oxyCODONE-acetaminophen 7.5-325 MG tablet Commonly known as:  PERCOCET Take 1-2 tablets by mouth as needed for pain.   ranitidine 150 MG tablet Commonly known as:  ZANTAC Take 150 mg by mouth 2 (two) times daily.

## 2017-01-02 NOTE — Progress Notes (Signed)
Patient alert and oriented, mae's well, voiding adequate amount of urine, swallowing without difficulty, no c/o pain. Patient discharged home with family. Script and discharged instructions given to patient. Patient and family stated understanding of d/c instructions given and has an appointment with Dr. Cyndy Freeze

## 2017-01-04 NOTE — Anesthesia Postprocedure Evaluation (Signed)
Anesthesia Post Note  Patient: Brandon Gallagher  Procedure(s) Performed: Procedure(s) (LRB): Cervical four- five and Cervical six- seven Anterior cervical discectomy with fusion and plate fixation (N/A)  Patient location during evaluation: PACU Anesthesia Type: General Level of consciousness: awake and sedated Pain management: pain level controlled Vital Signs Assessment: post-procedure vital signs reviewed and stable Respiratory status: spontaneous breathing, nonlabored ventilation, respiratory function stable and patient connected to nasal cannula oxygen Cardiovascular status: blood pressure returned to baseline and stable Postop Assessment: no signs of nausea or vomiting Anesthetic complications: no       Last Vitals:  Vitals:   01/02/17 0417 01/02/17 0809  BP: 127/66 118/64  Pulse: 62 (!) 57  Resp: 18 18  Temp: 36.6 C 36.5 C    Last Pain:  Vitals:   01/02/17 1010  TempSrc:   PainSc: 2                  Sully Manzi,JAMES TERRILL

## 2017-02-17 DIAGNOSIS — H6121 Impacted cerumen, right ear: Secondary | ICD-10-CM | POA: Diagnosis not present

## 2017-02-17 DIAGNOSIS — H9201 Otalgia, right ear: Secondary | ICD-10-CM | POA: Diagnosis not present

## 2017-02-17 DIAGNOSIS — H903 Sensorineural hearing loss, bilateral: Secondary | ICD-10-CM | POA: Diagnosis not present

## 2017-02-22 DIAGNOSIS — M4722 Other spondylosis with radiculopathy, cervical region: Secondary | ICD-10-CM | POA: Diagnosis not present

## 2017-02-22 DIAGNOSIS — J019 Acute sinusitis, unspecified: Secondary | ICD-10-CM | POA: Diagnosis not present

## 2017-03-27 DIAGNOSIS — M4722 Other spondylosis with radiculopathy, cervical region: Secondary | ICD-10-CM | POA: Diagnosis not present

## 2017-04-02 DIAGNOSIS — H6123 Impacted cerumen, bilateral: Secondary | ICD-10-CM | POA: Diagnosis not present

## 2017-04-02 DIAGNOSIS — R413 Other amnesia: Secondary | ICD-10-CM | POA: Diagnosis not present

## 2017-04-02 DIAGNOSIS — H903 Sensorineural hearing loss, bilateral: Secondary | ICD-10-CM | POA: Diagnosis not present

## 2017-04-05 DIAGNOSIS — J3489 Other specified disorders of nose and nasal sinuses: Secondary | ICD-10-CM | POA: Diagnosis not present

## 2017-04-05 DIAGNOSIS — J343 Hypertrophy of nasal turbinates: Secondary | ICD-10-CM | POA: Diagnosis not present

## 2017-04-05 DIAGNOSIS — Z8614 Personal history of Methicillin resistant Staphylococcus aureus infection: Secondary | ICD-10-CM | POA: Diagnosis not present

## 2017-04-05 DIAGNOSIS — J328 Other chronic sinusitis: Secondary | ICD-10-CM | POA: Diagnosis not present

## 2017-04-05 DIAGNOSIS — J302 Other seasonal allergic rhinitis: Secondary | ICD-10-CM | POA: Diagnosis not present

## 2017-04-14 DIAGNOSIS — G43909 Migraine, unspecified, not intractable, without status migrainosus: Secondary | ICD-10-CM | POA: Diagnosis not present

## 2017-04-15 DIAGNOSIS — S81011A Laceration without foreign body, right knee, initial encounter: Secondary | ICD-10-CM | POA: Diagnosis not present

## 2017-04-26 DIAGNOSIS — J302 Other seasonal allergic rhinitis: Secondary | ICD-10-CM | POA: Diagnosis not present

## 2017-04-26 DIAGNOSIS — J3489 Other specified disorders of nose and nasal sinuses: Secondary | ICD-10-CM | POA: Diagnosis not present

## 2017-04-26 DIAGNOSIS — J328 Other chronic sinusitis: Secondary | ICD-10-CM | POA: Diagnosis not present

## 2017-04-26 DIAGNOSIS — J343 Hypertrophy of nasal turbinates: Secondary | ICD-10-CM | POA: Diagnosis not present

## 2017-05-24 DIAGNOSIS — R51 Headache: Secondary | ICD-10-CM | POA: Diagnosis not present

## 2017-05-24 DIAGNOSIS — M542 Cervicalgia: Secondary | ICD-10-CM | POA: Diagnosis not present

## 2017-06-11 DIAGNOSIS — K5732 Diverticulitis of large intestine without perforation or abscess without bleeding: Secondary | ICD-10-CM | POA: Diagnosis not present

## 2017-06-11 DIAGNOSIS — G43909 Migraine, unspecified, not intractable, without status migrainosus: Secondary | ICD-10-CM | POA: Diagnosis not present

## 2017-06-26 DIAGNOSIS — R1032 Left lower quadrant pain: Secondary | ICD-10-CM | POA: Diagnosis not present

## 2017-06-26 DIAGNOSIS — R197 Diarrhea, unspecified: Secondary | ICD-10-CM | POA: Diagnosis not present

## 2017-06-27 DIAGNOSIS — A09 Infectious gastroenteritis and colitis, unspecified: Secondary | ICD-10-CM | POA: Diagnosis not present

## 2017-07-06 DIAGNOSIS — M62838 Other muscle spasm: Secondary | ICD-10-CM | POA: Diagnosis not present

## 2017-07-06 DIAGNOSIS — M5412 Radiculopathy, cervical region: Secondary | ICD-10-CM | POA: Diagnosis not present

## 2017-07-06 DIAGNOSIS — R51 Headache: Secondary | ICD-10-CM | POA: Diagnosis not present

## 2017-07-19 DIAGNOSIS — H903 Sensorineural hearing loss, bilateral: Secondary | ICD-10-CM | POA: Diagnosis not present

## 2017-07-19 DIAGNOSIS — H6123 Impacted cerumen, bilateral: Secondary | ICD-10-CM | POA: Diagnosis not present

## 2017-08-01 DIAGNOSIS — A09 Infectious gastroenteritis and colitis, unspecified: Secondary | ICD-10-CM | POA: Diagnosis not present

## 2017-08-20 DIAGNOSIS — E785 Hyperlipidemia, unspecified: Secondary | ICD-10-CM | POA: Diagnosis not present

## 2017-08-20 DIAGNOSIS — Z1389 Encounter for screening for other disorder: Secondary | ICD-10-CM | POA: Diagnosis not present

## 2017-08-20 DIAGNOSIS — Z0000 Encounter for general adult medical examination without abnormal findings: Secondary | ICD-10-CM | POA: Diagnosis not present

## 2017-08-20 DIAGNOSIS — Z9181 History of falling: Secondary | ICD-10-CM | POA: Diagnosis not present

## 2017-08-20 DIAGNOSIS — Z125 Encounter for screening for malignant neoplasm of prostate: Secondary | ICD-10-CM | POA: Diagnosis not present

## 2017-08-29 DIAGNOSIS — B356 Tinea cruris: Secondary | ICD-10-CM | POA: Diagnosis not present

## 2017-09-13 DIAGNOSIS — A0472 Enterocolitis due to Clostridium difficile, not specified as recurrent: Secondary | ICD-10-CM | POA: Diagnosis not present

## 2017-09-13 DIAGNOSIS — R51 Headache: Secondary | ICD-10-CM | POA: Diagnosis not present

## 2017-09-13 DIAGNOSIS — M5412 Radiculopathy, cervical region: Secondary | ICD-10-CM | POA: Diagnosis not present

## 2017-09-13 DIAGNOSIS — M62838 Other muscle spasm: Secondary | ICD-10-CM | POA: Diagnosis not present

## 2017-10-02 DIAGNOSIS — Z23 Encounter for immunization: Secondary | ICD-10-CM | POA: Diagnosis not present

## 2017-10-11 DIAGNOSIS — K5792 Diverticulitis of intestine, part unspecified, without perforation or abscess without bleeding: Secondary | ICD-10-CM | POA: Diagnosis not present

## 2017-10-17 DIAGNOSIS — G4730 Sleep apnea, unspecified: Secondary | ICD-10-CM | POA: Diagnosis not present

## 2017-10-17 DIAGNOSIS — D123 Benign neoplasm of transverse colon: Secondary | ICD-10-CM | POA: Diagnosis not present

## 2017-10-17 DIAGNOSIS — M1990 Unspecified osteoarthritis, unspecified site: Secondary | ICD-10-CM | POA: Diagnosis not present

## 2017-10-17 DIAGNOSIS — Z79899 Other long term (current) drug therapy: Secondary | ICD-10-CM | POA: Diagnosis not present

## 2017-10-17 DIAGNOSIS — D122 Benign neoplasm of ascending colon: Secondary | ICD-10-CM | POA: Diagnosis not present

## 2017-10-17 DIAGNOSIS — K219 Gastro-esophageal reflux disease without esophagitis: Secondary | ICD-10-CM | POA: Diagnosis not present

## 2017-10-17 DIAGNOSIS — K580 Irritable bowel syndrome with diarrhea: Secondary | ICD-10-CM | POA: Diagnosis not present

## 2017-10-17 DIAGNOSIS — K5730 Diverticulosis of large intestine without perforation or abscess without bleeding: Secondary | ICD-10-CM | POA: Diagnosis not present

## 2017-10-17 DIAGNOSIS — R197 Diarrhea, unspecified: Secondary | ICD-10-CM | POA: Diagnosis not present

## 2017-10-17 DIAGNOSIS — K635 Polyp of colon: Secondary | ICD-10-CM | POA: Diagnosis not present

## 2017-10-17 DIAGNOSIS — D124 Benign neoplasm of descending colon: Secondary | ICD-10-CM | POA: Diagnosis not present

## 2017-10-17 DIAGNOSIS — K6389 Other specified diseases of intestine: Secondary | ICD-10-CM | POA: Diagnosis not present

## 2017-10-17 DIAGNOSIS — K648 Other hemorrhoids: Secondary | ICD-10-CM | POA: Diagnosis not present

## 2017-10-22 HISTORY — PX: COLONOSCOPY: SHX174

## 2017-10-26 DIAGNOSIS — R197 Diarrhea, unspecified: Secondary | ICD-10-CM | POA: Diagnosis not present

## 2017-11-13 HISTORY — PX: COLONOSCOPY: SHX174

## 2017-11-21 DIAGNOSIS — M62838 Other muscle spasm: Secondary | ICD-10-CM | POA: Diagnosis not present

## 2017-11-21 DIAGNOSIS — R51 Headache: Secondary | ICD-10-CM | POA: Diagnosis not present

## 2017-11-21 DIAGNOSIS — M5412 Radiculopathy, cervical region: Secondary | ICD-10-CM | POA: Diagnosis not present

## 2017-12-06 DIAGNOSIS — K52832 Lymphocytic colitis: Secondary | ICD-10-CM | POA: Diagnosis not present

## 2017-12-06 DIAGNOSIS — Z86010 Personal history of colonic polyps: Secondary | ICD-10-CM | POA: Diagnosis not present

## 2017-12-06 DIAGNOSIS — R1030 Lower abdominal pain, unspecified: Secondary | ICD-10-CM | POA: Diagnosis not present

## 2018-01-03 DIAGNOSIS — M7661 Achilles tendinitis, right leg: Secondary | ICD-10-CM | POA: Diagnosis not present

## 2018-01-03 DIAGNOSIS — S93491A Sprain of other ligament of right ankle, initial encounter: Secondary | ICD-10-CM | POA: Diagnosis not present

## 2018-01-08 DIAGNOSIS — K529 Noninfective gastroenteritis and colitis, unspecified: Secondary | ICD-10-CM | POA: Diagnosis not present

## 2018-01-08 DIAGNOSIS — R1030 Lower abdominal pain, unspecified: Secondary | ICD-10-CM | POA: Diagnosis not present

## 2018-02-05 DIAGNOSIS — R1030 Lower abdominal pain, unspecified: Secondary | ICD-10-CM | POA: Diagnosis not present

## 2018-02-05 DIAGNOSIS — Z86010 Personal history of colonic polyps: Secondary | ICD-10-CM | POA: Diagnosis not present

## 2018-02-12 DIAGNOSIS — R1030 Lower abdominal pain, unspecified: Secondary | ICD-10-CM | POA: Diagnosis not present

## 2018-02-12 DIAGNOSIS — K5730 Diverticulosis of large intestine without perforation or abscess without bleeding: Secondary | ICD-10-CM | POA: Diagnosis not present

## 2018-02-12 DIAGNOSIS — R197 Diarrhea, unspecified: Secondary | ICD-10-CM | POA: Diagnosis not present

## 2018-02-12 DIAGNOSIS — K760 Fatty (change of) liver, not elsewhere classified: Secondary | ICD-10-CM | POA: Diagnosis not present

## 2018-02-12 DIAGNOSIS — R109 Unspecified abdominal pain: Secondary | ICD-10-CM | POA: Diagnosis not present

## 2018-02-18 DIAGNOSIS — M7661 Achilles tendinitis, right leg: Secondary | ICD-10-CM | POA: Diagnosis not present

## 2018-02-18 DIAGNOSIS — M19071 Primary osteoarthritis, right ankle and foot: Secondary | ICD-10-CM | POA: Diagnosis not present

## 2018-02-18 DIAGNOSIS — S93491A Sprain of other ligament of right ankle, initial encounter: Secondary | ICD-10-CM | POA: Diagnosis not present

## 2018-02-20 DIAGNOSIS — K635 Polyp of colon: Secondary | ICD-10-CM | POA: Diagnosis not present

## 2018-02-20 DIAGNOSIS — D124 Benign neoplasm of descending colon: Secondary | ICD-10-CM | POA: Diagnosis not present

## 2018-02-20 DIAGNOSIS — R1030 Lower abdominal pain, unspecified: Secondary | ICD-10-CM | POA: Diagnosis not present

## 2018-02-20 DIAGNOSIS — Z86010 Personal history of colonic polyps: Secondary | ICD-10-CM | POA: Diagnosis not present

## 2018-02-25 DIAGNOSIS — M545 Low back pain: Secondary | ICD-10-CM | POA: Diagnosis not present

## 2018-02-26 DIAGNOSIS — M25571 Pain in right ankle and joints of right foot: Secondary | ICD-10-CM | POA: Diagnosis not present

## 2018-02-26 DIAGNOSIS — M5489 Other dorsalgia: Secondary | ICD-10-CM | POA: Diagnosis not present

## 2018-02-26 DIAGNOSIS — R2689 Other abnormalities of gait and mobility: Secondary | ICD-10-CM | POA: Diagnosis not present

## 2018-03-05 DIAGNOSIS — M5489 Other dorsalgia: Secondary | ICD-10-CM | POA: Diagnosis not present

## 2018-03-05 DIAGNOSIS — R2689 Other abnormalities of gait and mobility: Secondary | ICD-10-CM | POA: Diagnosis not present

## 2018-03-05 DIAGNOSIS — M25571 Pain in right ankle and joints of right foot: Secondary | ICD-10-CM | POA: Diagnosis not present

## 2018-03-07 DIAGNOSIS — M25571 Pain in right ankle and joints of right foot: Secondary | ICD-10-CM | POA: Diagnosis not present

## 2018-03-07 DIAGNOSIS — R2689 Other abnormalities of gait and mobility: Secondary | ICD-10-CM | POA: Diagnosis not present

## 2018-03-07 DIAGNOSIS — M5489 Other dorsalgia: Secondary | ICD-10-CM | POA: Diagnosis not present

## 2018-03-11 DIAGNOSIS — L570 Actinic keratosis: Secondary | ICD-10-CM | POA: Diagnosis not present

## 2018-03-20 DIAGNOSIS — M5489 Other dorsalgia: Secondary | ICD-10-CM | POA: Diagnosis not present

## 2018-03-20 DIAGNOSIS — M25571 Pain in right ankle and joints of right foot: Secondary | ICD-10-CM | POA: Diagnosis not present

## 2018-03-20 DIAGNOSIS — R2689 Other abnormalities of gait and mobility: Secondary | ICD-10-CM | POA: Diagnosis not present

## 2018-03-21 DIAGNOSIS — J309 Allergic rhinitis, unspecified: Secondary | ICD-10-CM | POA: Diagnosis not present

## 2018-03-21 DIAGNOSIS — M5136 Other intervertebral disc degeneration, lumbar region: Secondary | ICD-10-CM | POA: Diagnosis not present

## 2018-04-03 DIAGNOSIS — L578 Other skin changes due to chronic exposure to nonionizing radiation: Secondary | ICD-10-CM | POA: Diagnosis not present

## 2018-04-03 DIAGNOSIS — L820 Inflamed seborrheic keratosis: Secondary | ICD-10-CM | POA: Diagnosis not present

## 2018-04-07 DIAGNOSIS — J019 Acute sinusitis, unspecified: Secondary | ICD-10-CM | POA: Diagnosis not present

## 2018-05-09 DIAGNOSIS — R51 Headache: Secondary | ICD-10-CM | POA: Diagnosis not present

## 2018-05-09 DIAGNOSIS — M5412 Radiculopathy, cervical region: Secondary | ICD-10-CM | POA: Diagnosis not present

## 2018-06-05 DIAGNOSIS — B356 Tinea cruris: Secondary | ICD-10-CM | POA: Diagnosis not present

## 2018-06-12 DIAGNOSIS — M5412 Radiculopathy, cervical region: Secondary | ICD-10-CM | POA: Diagnosis not present

## 2018-06-12 DIAGNOSIS — G629 Polyneuropathy, unspecified: Secondary | ICD-10-CM | POA: Diagnosis not present

## 2018-06-12 DIAGNOSIS — G4730 Sleep apnea, unspecified: Secondary | ICD-10-CM | POA: Diagnosis not present

## 2018-06-12 DIAGNOSIS — R51 Headache: Secondary | ICD-10-CM | POA: Diagnosis not present

## 2018-07-01 DIAGNOSIS — G4733 Obstructive sleep apnea (adult) (pediatric): Secondary | ICD-10-CM | POA: Diagnosis not present

## 2018-07-03 DIAGNOSIS — G43709 Chronic migraine without aura, not intractable, without status migrainosus: Secondary | ICD-10-CM | POA: Diagnosis not present

## 2018-07-03 DIAGNOSIS — Z79899 Other long term (current) drug therapy: Secondary | ICD-10-CM | POA: Diagnosis not present

## 2018-07-03 DIAGNOSIS — Z049 Encounter for examination and observation for unspecified reason: Secondary | ICD-10-CM | POA: Diagnosis not present

## 2018-07-09 DIAGNOSIS — M791 Myalgia, unspecified site: Secondary | ICD-10-CM | POA: Diagnosis not present

## 2018-07-09 DIAGNOSIS — M542 Cervicalgia: Secondary | ICD-10-CM | POA: Diagnosis not present

## 2018-07-09 DIAGNOSIS — G43709 Chronic migraine without aura, not intractable, without status migrainosus: Secondary | ICD-10-CM | POA: Diagnosis not present

## 2018-07-10 DIAGNOSIS — R51 Headache: Secondary | ICD-10-CM | POA: Diagnosis not present

## 2018-07-10 DIAGNOSIS — Z79899 Other long term (current) drug therapy: Secondary | ICD-10-CM | POA: Diagnosis not present

## 2018-07-24 DIAGNOSIS — G43709 Chronic migraine without aura, not intractable, without status migrainosus: Secondary | ICD-10-CM | POA: Diagnosis not present

## 2018-07-24 DIAGNOSIS — M542 Cervicalgia: Secondary | ICD-10-CM | POA: Diagnosis not present

## 2018-07-24 DIAGNOSIS — M791 Myalgia, unspecified site: Secondary | ICD-10-CM | POA: Diagnosis not present

## 2018-08-06 DIAGNOSIS — G629 Polyneuropathy, unspecified: Secondary | ICD-10-CM | POA: Diagnosis not present

## 2018-08-13 DIAGNOSIS — G43709 Chronic migraine without aura, not intractable, without status migrainosus: Secondary | ICD-10-CM | POA: Diagnosis not present

## 2018-08-13 DIAGNOSIS — M791 Myalgia, unspecified site: Secondary | ICD-10-CM | POA: Diagnosis not present

## 2018-08-13 DIAGNOSIS — M542 Cervicalgia: Secondary | ICD-10-CM | POA: Diagnosis not present

## 2018-08-16 DIAGNOSIS — M5416 Radiculopathy, lumbar region: Secondary | ICD-10-CM | POA: Diagnosis not present

## 2018-08-20 DIAGNOSIS — G5600 Carpal tunnel syndrome, unspecified upper limb: Secondary | ICD-10-CM | POA: Diagnosis not present

## 2018-08-20 DIAGNOSIS — G629 Polyneuropathy, unspecified: Secondary | ICD-10-CM | POA: Diagnosis not present

## 2018-08-26 DIAGNOSIS — M7731 Calcaneal spur, right foot: Secondary | ICD-10-CM | POA: Diagnosis not present

## 2018-08-26 DIAGNOSIS — M5116 Intervertebral disc disorders with radiculopathy, lumbar region: Secondary | ICD-10-CM | POA: Diagnosis not present

## 2018-08-26 DIAGNOSIS — M19071 Primary osteoarthritis, right ankle and foot: Secondary | ICD-10-CM | POA: Diagnosis not present

## 2018-08-26 DIAGNOSIS — M25571 Pain in right ankle and joints of right foot: Secondary | ICD-10-CM | POA: Diagnosis not present

## 2018-08-26 DIAGNOSIS — M5136 Other intervertebral disc degeneration, lumbar region: Secondary | ICD-10-CM | POA: Diagnosis not present

## 2018-08-26 DIAGNOSIS — Z981 Arthrodesis status: Secondary | ICD-10-CM | POA: Diagnosis not present

## 2018-08-26 DIAGNOSIS — M5416 Radiculopathy, lumbar region: Secondary | ICD-10-CM | POA: Diagnosis not present

## 2018-08-27 DIAGNOSIS — M791 Myalgia, unspecified site: Secondary | ICD-10-CM | POA: Diagnosis not present

## 2018-08-27 DIAGNOSIS — M542 Cervicalgia: Secondary | ICD-10-CM | POA: Diagnosis not present

## 2018-08-27 DIAGNOSIS — G43709 Chronic migraine without aura, not intractable, without status migrainosus: Secondary | ICD-10-CM | POA: Diagnosis not present

## 2018-09-03 DIAGNOSIS — M5136 Other intervertebral disc degeneration, lumbar region: Secondary | ICD-10-CM | POA: Diagnosis not present

## 2018-09-03 DIAGNOSIS — M48061 Spinal stenosis, lumbar region without neurogenic claudication: Secondary | ICD-10-CM | POA: Diagnosis not present

## 2018-09-03 DIAGNOSIS — M5126 Other intervertebral disc displacement, lumbar region: Secondary | ICD-10-CM | POA: Diagnosis not present

## 2018-09-03 DIAGNOSIS — M47816 Spondylosis without myelopathy or radiculopathy, lumbar region: Secondary | ICD-10-CM | POA: Diagnosis not present

## 2018-09-10 DIAGNOSIS — M542 Cervicalgia: Secondary | ICD-10-CM | POA: Diagnosis not present

## 2018-09-10 DIAGNOSIS — M791 Myalgia, unspecified site: Secondary | ICD-10-CM | POA: Diagnosis not present

## 2018-09-10 DIAGNOSIS — Z0000 Encounter for general adult medical examination without abnormal findings: Secondary | ICD-10-CM | POA: Diagnosis not present

## 2018-09-10 DIAGNOSIS — G43709 Chronic migraine without aura, not intractable, without status migrainosus: Secondary | ICD-10-CM | POA: Diagnosis not present

## 2018-09-10 DIAGNOSIS — Z23 Encounter for immunization: Secondary | ICD-10-CM | POA: Diagnosis not present

## 2018-09-10 DIAGNOSIS — E785 Hyperlipidemia, unspecified: Secondary | ICD-10-CM | POA: Diagnosis not present

## 2018-09-12 ENCOUNTER — Telehealth: Payer: Self-pay | Admitting: *Deleted

## 2018-09-12 ENCOUNTER — Encounter: Payer: Self-pay | Admitting: Sports Medicine

## 2018-09-12 ENCOUNTER — Ambulatory Visit: Payer: Medicare Other | Admitting: Sports Medicine

## 2018-09-12 VITALS — BP 118/69 | HR 81 | Resp 16 | Ht 71.0 in | Wt 271.0 lb

## 2018-09-12 DIAGNOSIS — S96911A Strain of unspecified muscle and tendon at ankle and foot level, right foot, initial encounter: Secondary | ICD-10-CM | POA: Diagnosis not present

## 2018-09-12 DIAGNOSIS — E785 Hyperlipidemia, unspecified: Secondary | ICD-10-CM | POA: Diagnosis not present

## 2018-09-12 DIAGNOSIS — R7301 Impaired fasting glucose: Secondary | ICD-10-CM | POA: Diagnosis not present

## 2018-09-12 DIAGNOSIS — M79671 Pain in right foot: Secondary | ICD-10-CM | POA: Diagnosis not present

## 2018-09-12 DIAGNOSIS — M779 Enthesopathy, unspecified: Secondary | ICD-10-CM | POA: Diagnosis not present

## 2018-09-12 DIAGNOSIS — M25571 Pain in right ankle and joints of right foot: Secondary | ICD-10-CM | POA: Diagnosis not present

## 2018-09-12 DIAGNOSIS — G629 Polyneuropathy, unspecified: Secondary | ICD-10-CM

## 2018-09-12 DIAGNOSIS — E559 Vitamin D deficiency, unspecified: Secondary | ICD-10-CM | POA: Diagnosis not present

## 2018-09-12 DIAGNOSIS — S93401A Sprain of unspecified ligament of right ankle, initial encounter: Secondary | ICD-10-CM

## 2018-09-12 NOTE — Progress Notes (Signed)
   Subjective:    Patient ID: Brandon Gallagher, male    DOB: [birth year], 57 y.o.   MRN: [id number]  HPI    Review of Systems  Musculoskeletal: Positive for arthralgias, back pain and myalgias.  All other systems reviewed and are negative.      Objective:   Physical Exam        Assessment & Plan:

## 2018-09-12 NOTE — Telephone Encounter (Signed)
Orders to J. Quintana, RN for pre-cert. 

## 2018-09-12 NOTE — Telephone Encounter (Signed)
Orders to Gretta Arab, RN for Pre-cert.

## 2018-09-12 NOTE — Telephone Encounter (Signed)
-----   Message from [place], [place] sent at 09/12/2018  9:35 AM EDT ----- Regarding: MRI R ankle Pain x 4 months at lateral ankle and achilles  R/o tendon tear

## 2018-09-12 NOTE — Progress Notes (Signed)
Subjective:  Brandon Gallagher is a 58 y.o. male patient who presents to office for evaluation of right ankle pain. Patient complains of continued pain in the ankle x4 months after he took a misstep and twisted his ankle.  Patient reports that pain is sharp comes and goes worse at night or after he rests and goes to walk again states that he has been seen by orthopedics who prescribed the ankle brace and physical therapy states that the ankle brace is uncomfortable and physical therapy helped for a little before he quit after 6 sessions dates that he is also wearing custom orthotics that are about 3 months old that helped a little. Patient admits to extensive history of back issues and was diagnosed with neuropathy states that he has additional numbness and tingling as well as the sharp pain that radiates up the back of the leg and occasional swelling. Patient denies any other pedal complaints.   Review of Systems  Musculoskeletal: Positive for back pain, joint pain and myalgias.     Patient Active Problem List   Diagnosis Date Noted  . Cervical spondylosis with radiculopathy 01/01/2017  . Sacroiliac joint pain 07/27/2016  . Chronic bilateral low back pain without sciatica 01/10/2016  . Fall 01/10/2016  . S/P lumbar laminectomy 11/01/2015  . Asthma 10/06/2015  . GERD (gastroesophageal reflux disease) 10/06/2015  . OSA (obstructive sleep apnea) 10/06/2015  . Morbid obesity due to excess calories ([place]) 07/28/2015    Current Outpatient Medications on File Prior to Visit  Medication Sig Dispense Refill  . ALPRAZolam (XANAX) 1 MG tablet Take 1 mg by mouth 2 (two) times daily as needed for anxiety.     . cyclobenzaprine (FLEXERIL) 10 MG tablet Take 10 mg by mouth 3 (three) times daily as needed for muscle spasms.    . methocarbamol (ROBAXIN) 750 MG tablet Take 1 tablet (750 mg total) by mouth 4 (four) times daily. 120 tablet 1  . omeprazole (PRILOSEC) 20 MG capsule Take 20 mg by mouth 2 (two)  times daily.    . ranitidine (ZANTAC) 150 MG tablet Take 150 mg by mouth 2 (two) times daily.    Marland Kitchen terbinafine (LAMISIL) 250 MG tablet Take 250 mg by mouth daily.  1  . zonisamide (ZONEGRAN) 100 MG capsule Take 100 mg by mouth daily.     No current facility-administered medications on file prior to visit.     Allergies  Allergen Reactions  . Iodinated Diagnostic Agents     Other reaction(s): NO ALLERGY  . Latex     Other reaction(s): NO ALLERGY  . No Known Allergies   . Shellfish-Derived Products     Other reaction(s): NO ALLERGY    Objective:  General: Alert and oriented x3 in no acute distress  Dermatology: No open lesions bilateral lower extremities, no webspace macerations, no ecchymosis bilateral, all nails x 10 are well manicured.  Mild reactive callus sub-met one and 5 bilateral.  Vascular: Dorsalis Pedis and Posterior Tibial pedal pulses palpable, Capillary Fill Time 3 seconds,(+) pedal hair growth bilateral, no edema bilateral lower extremities, Temperature gradient within normal limits.  Neurology: Gross sensation intact via light touch bilateral, vibratory sensation diminished bilateral consistent with history of neuropathy.  Musculoskeletal: Mild tenderness with palpation at insertion of the Achilles on right and at lateral ankle ligaments on the right. Negative talar tilt, Negative tib-fib stress, No frank instability. No pain with calf compression bilateral. Range of motion within normal limits with mild guarding on right ankle.  Severe hammertoe deformity bilateral, strength within normal limits in all groups bilateral except on right ankle where there is mild weakness and guarding due to past history of injury.   Gait: Mildly antalgic gait   Assessment and Plan: Problem List Items Addressed This Visit    None    Visit Diagnoses    Right foot pain    -  Primary   Right ankle pain, unspecified chronicity       Sprain of right ankle, unspecified ligament, initial  encounter       Tear of tendon of right ankle, initial encounter       Tendonitis       Neuropathy          -Complete examination performed -Xrays reviewed on CD that patient brought to office that are approximately 66 weeks old that revealed diffuse arthritis no other acute findings -Discussed treatement options for likely sprain injury with possibility of tendon tear due to the chronicity of symptoms that could also be worsened by patient history of back surgery and neuropathy -Rx MRI for further evaluation to rule out tear -Applied heel lift to right insole to see if this will allow the ankle time to rest and to take pressure off his Achilles tendon also recommend gentle stretching and dispensed a night splint to also use for any tendon related pain along the Achilles -Advised patient to decrease activity at this time until we get his MRI back to determine if we can progress his activities -Recommend rest ice elevation protection and anti-inflammatories over-the-counter as needed -Patient to return to office after MRI or sooner if condition worsens.  Landis Martins, DPM

## 2018-09-12 NOTE — Telephone Encounter (Signed)
-----   Message from [place], [place] sent at 09/12/2018  9:35 AM EDT ----- Regarding: MRI R ankle Pain x 4 months at lateral ankle and achilles  R/o tendon tear

## 2018-09-17 ENCOUNTER — Telehealth: Payer: Self-pay | Admitting: Sports Medicine

## 2018-09-17 NOTE — Telephone Encounter (Signed)
I told pt I had not received a prior authorization for his MRI. Pt states his insurance says he does not need a PA. I told pt I could schedule and will call with the appt.

## 2018-09-17 NOTE — Telephone Encounter (Signed)
Pt called stating he was supposed to be scheduled for an MRI and hasn't heard anything yet. Also stated that he reached out to his insurance company and they've told him he doesn't need 'pre-authorization'.

## 2018-09-17 NOTE — Telephone Encounter (Signed)
Patient called concerning MRI appt. He has not heard anything.

## 2018-09-18 NOTE — Telephone Encounter (Signed)
I informed pt of MRI appt at [facility].

## 2018-09-18 NOTE — Telephone Encounter (Signed)
[facility] scheduled MRI right ankle wo contrast 73718 on 09/24/2018 arrive 4:45pm for 5:00pm imaging. Faxed orders to [facility].

## 2018-09-20 DIAGNOSIS — M4316 Spondylolisthesis, lumbar region: Secondary | ICD-10-CM | POA: Diagnosis not present

## 2018-09-20 DIAGNOSIS — M25571 Pain in right ankle and joints of right foot: Secondary | ICD-10-CM | POA: Diagnosis not present

## 2018-09-20 DIAGNOSIS — M47816 Spondylosis without myelopathy or radiculopathy, lumbar region: Secondary | ICD-10-CM | POA: Diagnosis not present

## 2018-09-24 DIAGNOSIS — S96911A Strain of unspecified muscle and tendon at ankle and foot level, right foot, initial encounter: Secondary | ICD-10-CM | POA: Diagnosis not present

## 2018-09-24 DIAGNOSIS — M19071 Primary osteoarthritis, right ankle and foot: Secondary | ICD-10-CM | POA: Diagnosis not present

## 2018-09-26 ENCOUNTER — Encounter: Payer: Self-pay | Admitting: Sports Medicine

## 2018-09-26 ENCOUNTER — Telehealth: Payer: Self-pay | Admitting: Sports Medicine

## 2018-09-26 NOTE — Telephone Encounter (Signed)
Thanks I am out of office rest of day today and tomorrow. I will look for results and give patient a call likely on Monday Thanks Dr. Chauncey Cruel

## 2018-09-26 NOTE — Telephone Encounter (Signed)
[facility] states she will fax the MRI of 09/24/2018 to [phone].

## 2018-09-26 NOTE — Telephone Encounter (Signed)
Pt called and wanted to follow up on his MRI results. Please call patient

## 2018-09-26 NOTE — Telephone Encounter (Signed)
I informed pt, MRI had not been received of review, that I called to have the results faxed to our office. I told pt I would inform Dr. Cannon Kettle the results had been requested and once received she would be able to review in his clinicals.

## 2018-09-27 ENCOUNTER — Encounter: Payer: Self-pay | Admitting: Sports Medicine

## 2018-09-30 ENCOUNTER — Telehealth: Payer: Self-pay | Admitting: Sports Medicine

## 2018-09-30 NOTE — Telephone Encounter (Signed)
Patient did not answer. I called to discuss MRI results which reveals arthritis of the foot and flexor tendonitis. No tear at his achilles or any other soft tissues. Recommend at this time if patient is still having pain for patient to come to office for [place] to try steroid injection and placing in CAM boot. Otherwise if his pain is better recommend continue with monitoring and heel lift as I have given at previous visit. -Dr. Cannon Kettle

## 2018-10-01 ENCOUNTER — Encounter: Payer: Self-pay | Admitting: Sports Medicine

## 2018-10-01 ENCOUNTER — Encounter (INDEPENDENT_AMBULATORY_CARE_PROVIDER_SITE_OTHER): Payer: Self-pay

## 2018-10-03 ENCOUNTER — Ambulatory Visit: Payer: Medicare Other | Admitting: Sports Medicine

## 2018-10-03 ENCOUNTER — Encounter: Payer: Self-pay | Admitting: Sports Medicine

## 2018-10-03 DIAGNOSIS — M779 Enthesopathy, unspecified: Secondary | ICD-10-CM

## 2018-10-03 DIAGNOSIS — G629 Polyneuropathy, unspecified: Secondary | ICD-10-CM

## 2018-10-03 DIAGNOSIS — M25571 Pain in right ankle and joints of right foot: Secondary | ICD-10-CM

## 2018-10-03 MED ORDER — TRIAMCINOLONE ACETONIDE 10 MG/ML IJ SUSP
10.0000 mg | Freq: Once | INTRAMUSCULAR | Status: AC
  Administered 2018-10-03: 10 mg

## 2018-10-03 NOTE — Progress Notes (Signed)
Subjective:  Brandon Gallagher is a 57 y.o. male patient who returns to office for follow-up evaluation of right posterior ankle pain.  Patient reports that he had his MRI and is here to discuss results and states that his pain is about the same states that he notices the pain more depending on what activities he has done states that when he is doing a lot of walking and standing he notices more pain in the heel and sometimes has even had has been driving states that he has been compliant with using his night splint resting ice elevation and using anti-inflammatories however his neurologist Dr. recently took him off anti-inflammatories because he felt like he could be a trigger for his headaches and states that his PCP put him on pain medication and medication for his muscle spasms.  Otherwise patient denies any other pedal complaints at this time no new issues noted.    Patient Active Problem List   Diagnosis Date Noted  . Cervical spondylosis with radiculopathy 01/01/2017  . Sacroiliac joint pain 07/27/2016  . Chronic bilateral low back pain without sciatica 01/10/2016  . Fall 01/10/2016  . S/P lumbar laminectomy 11/01/2015  . Asthma 10/06/2015  . GERD (gastroesophageal reflux disease) 10/06/2015  . OSA (obstructive sleep apnea) 10/06/2015  . Morbid obesity due to excess calories ([place]) 07/28/2015    Current Outpatient Medications on File Prior to Visit  Medication Sig Dispense Refill  . ALPRAZolam (XANAX) 1 MG tablet Take 1 mg by mouth 2 (two) times daily as needed for anxiety.     . cyclobenzaprine (FLEXERIL) 10 MG tablet Take 10 mg by mouth 3 (three) times daily as needed for muscle spasms.    Marland Kitchen HYDROcodone-acetaminophen (NORCO/VICODIN) 5-325 MG tablet TAKE ONE TABLET BY MOUTH 4 TIMES DAILY AS NEEDED  0  . methocarbamol (ROBAXIN) 500 MG tablet TAKE ONE TABLET BY MOUTH AS NEEDED FOR MIGRAINE UP TO TWICE DAILY LIMIT USE OF 1-2 DAYS PER WEEK  0  . omeprazole (PRILOSEC) 20 MG capsule Take 20  mg by mouth 2 (two) times daily.    . ranitidine (ZANTAC) 150 MG tablet Take 150 mg by mouth 2 (two) times daily.    Marland Kitchen terbinafine (LAMISIL) 250 MG tablet Take 250 mg by mouth daily.  1  . zonisamide (ZONEGRAN) 50 MG capsule Take 150 mg by mouth daily.  1   No current facility-administered medications on file prior to visit.     Allergies  Allergen Reactions  . Iodinated Diagnostic Agents     Other reaction(s): NO ALLERGY  . Latex     Other reaction(s): NO ALLERGY  . No Known Allergies   . Shellfish-Derived Products     Other reaction(s): NO ALLERGY    Objective:  General: Alert and oriented x3 in no acute distress  Dermatology: No open lesions bilateral lower extremities, no webspace macerations, no ecchymosis bilateral, all nails x 10 are well manicured.  Mild reactive callus sub-met one and 5 bilateral.  Vascular: Dorsalis Pedis and Posterior Tibial pedal pulses palpable, Capillary Fill Time 3 seconds,(+) pedal hair growth bilateral, no edema bilateral lower extremities, Temperature gradient within normal limits.  Neurology: Gross sensation intact via light touch bilateral, vibratory sensation diminished bilateral consistent with history of neuropathy.  Musculoskeletal: Mild tenderness with palpation at insertion of the Achilles on right and at the medial aspect along the flexor tendons as well. Negative talar tilt, Negative tib-fib stress, No frank instability. No pain with calf compression bilateral. Range of motion  within normal limits with mild guarding on right ankle.  Severe hammertoe deformity bilateral, strength within normal limits in all groups bilateral except on right ankle where there is mild weakness and guarding due to past history of injury.   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   Assessment and Plan: Problem List Items Addressed This Visit    None    Visit Diagnoses    Tendonitis    -  Primary    Relevant Medications   triamcinolone acetonide (KENALOG) 10 MG/ML injection 10 mg (Completed) (Start on 10/03/2018  1:30 PM)   Right ankle pain, unspecified chronicity       Neuropathy          -Complete examination performed -MRI results reviewed with patient -Discussed treatement options for tendinitis -After oral consent and aseptic prep, injected a mixture containing 1 ml of 2%  plain lidocaine, 1 ml 0.5% plain marcaine, 0.5 ml of kenalog 10 and 0.5 ml of dexamethasone phosphate into flexor tendon course at the medial aspect of the Achilles without injury or complication. Post-injection care discussed with patient.  -Dispensed cam boot for patient to use for 2 weeks and educated patient on use advised patient after 2 weeks he may slowly transition to a heel lift -Patient to return to office in 3 weeks or sooner if condition worsens.  Landis Martins, DPM

## 2018-10-04 ENCOUNTER — Telehealth: Payer: Self-pay | Admitting: Sports Medicine

## 2018-10-04 NOTE — Telephone Encounter (Signed)
Left message informing pt that I was returning his call from this morning and that he should be in the boot at all times he is weight bearing, if resting, bathing or sleeping he did not need to be in the boot, and if he had questions about the structure or fit of the boot he should contact the [place] office. I told him I would be in the office until 2:00pm or call again Monday.

## 2018-10-04 NOTE — Telephone Encounter (Signed)
Pt called with questions regarding the boot he is wearing. Should he be wearing the brace at all times, can he walk/exercise while in the boot?  Please give pt a call

## 2018-10-22 DIAGNOSIS — M542 Cervicalgia: Secondary | ICD-10-CM | POA: Diagnosis not present

## 2018-10-22 DIAGNOSIS — M791 Myalgia, unspecified site: Secondary | ICD-10-CM | POA: Diagnosis not present

## 2018-10-22 DIAGNOSIS — G43709 Chronic migraine without aura, not intractable, without status migrainosus: Secondary | ICD-10-CM | POA: Diagnosis not present

## 2018-10-24 ENCOUNTER — Encounter: Payer: Self-pay | Admitting: Sports Medicine

## 2018-10-24 ENCOUNTER — Ambulatory Visit (INDEPENDENT_AMBULATORY_CARE_PROVIDER_SITE_OTHER): Payer: Medicare Other | Admitting: Sports Medicine

## 2018-10-24 DIAGNOSIS — G629 Polyneuropathy, unspecified: Secondary | ICD-10-CM | POA: Diagnosis not present

## 2018-10-24 DIAGNOSIS — M779 Enthesopathy, unspecified: Secondary | ICD-10-CM

## 2018-10-24 DIAGNOSIS — M25571 Pain in right ankle and joints of right foot: Secondary | ICD-10-CM | POA: Diagnosis not present

## 2018-10-24 NOTE — Progress Notes (Signed)
Subjective:  Brandon Gallagher is a 57 y.o. male patient who returns to office for follow-up evaluation of right posterior ankle pain after trigger point injection performed last visit.  Patient reports that he was doing good with very minimal pain at all yesterday went without his cam boot and was walking around barefoot and started experiencing pain again.  Patient states at worst pain is 5-6 out of 10.  Patient denies increased swelling, warmth, redness or any other limitations with motion.  No other pedal complaints noted.     Patient Active Problem List   Diagnosis Date Noted  . Cervical spondylosis with radiculopathy 01/01/2017  . Sacroiliac joint pain 07/27/2016  . Chronic bilateral low back pain without sciatica 01/10/2016  . Fall 01/10/2016  . S/P lumbar laminectomy 11/01/2015  . Asthma 10/06/2015  . GERD (gastroesophageal reflux disease) 10/06/2015  . OSA (obstructive sleep apnea) 10/06/2015  . Morbid obesity due to excess calories ([place]) 07/28/2015    Current Outpatient Medications on File Prior to Visit  Medication Sig Dispense Refill  . ALPRAZolam (XANAX) 1 MG tablet Take 1 mg by mouth 2 (two) times daily as needed for anxiety.     . cyclobenzaprine (FLEXERIL) 10 MG tablet Take 10 mg by mouth 3 (three) times daily as needed for muscle spasms.    Marland Kitchen HYDROcodone-acetaminophen (NORCO/VICODIN) 5-325 MG tablet TAKE ONE TABLET BY MOUTH 4 TIMES DAILY AS NEEDED  0  . methocarbamol (ROBAXIN) 500 MG tablet TAKE ONE TABLET BY MOUTH AS NEEDED FOR MIGRAINE UP TO TWICE DAILY LIMIT USE OF 1-2 DAYS PER WEEK  0  . omeprazole (PRILOSEC) 20 MG capsule Take 20 mg by mouth 2 (two) times daily.    . ranitidine (ZANTAC) 150 MG tablet Take 150 mg by mouth 2 (two) times daily.    Marland Kitchen terbinafine (LAMISIL) 250 MG tablet Take 250 mg by mouth daily.  1  . zonisamide (ZONEGRAN) 50 MG capsule Take 150 mg by mouth daily.  1   No current facility-administered medications on file prior to visit.     Allergies   Allergen Reactions  . Iodinated Diagnostic Agents     Other reaction(s): NO ALLERGY  . Latex     Other reaction(s): NO ALLERGY  . No Known Allergies   . Shellfish-Derived Products     Other reaction(s): NO ALLERGY    Objective:  General: Alert and oriented x3 in no acute distress  Dermatology: No open lesions bilateral lower extremities, no webspace macerations, no ecchymosis bilateral, all nails x 10 are well manicured.  Mild reactive callus sub-met one and 5 bilateral.  Vascular: Dorsalis Pedis and Posterior Tibial pedal pulses palpable, Capillary Fill Time 3 seconds,(+) pedal hair growth bilateral, no edema bilateral lower extremities, Temperature gradient within normal limits.  Neurology: Gross sensation intact via light touch bilateral, vibratory sensation diminished bilateral consistent with history of neuropathy.  Musculoskeletal: There remains mild tenderness with palpation at insertion of the Achilles on right and at the medial aspect along the flexor tendons as well. Negative talar tilt, Negative tib-fib stress, No frank instability. No pain with calf compression bilateral. Range of motion within normal limits with mild guarding on right ankle.  Severe hammertoe deformity bilateral, strength within normal limits in all groups bilateral except on right ankle where there is mild weakness and guarding due to past history of injury.    Assessment and Plan: Problem List Items Addressed This Visit    None    Visit Diagnoses  Tendonitis    -  Primary   Right ankle pain, unspecified chronicity       Neuropathy         -Complete examination performed -Discussed continued care for tendinitis -Advised patient to refrain from walking barefoot and going without his cam boot in order to avoid aggravation or worsening of pain or symptoms -Consult placed to sports medicine Dr. Oneida Alar for [name] for ultrasound-guided flexor tendon injection -Recommend continue with rest ice  elevation -Patient to return to office after injection or sooner if condition worsens.  Landis Martins, DPM

## 2018-10-25 ENCOUNTER — Telehealth: Payer: Self-pay | Admitting: *Deleted

## 2018-10-25 DIAGNOSIS — M779 Enthesopathy, unspecified: Secondary | ICD-10-CM

## 2018-10-25 DIAGNOSIS — S96911A Strain of unspecified muscle and tendon at ankle and foot level, right foot, initial encounter: Secondary | ICD-10-CM

## 2018-10-25 DIAGNOSIS — M25571 Pain in right ankle and joints of right foot: Secondary | ICD-10-CM

## 2018-10-25 NOTE — Telephone Encounter (Signed)
-----   Message from Brandon Gallagher, [place] sent at 10/24/2018 11:08 AM EST ----- Regarding: Ultrasound guided injection Hi Nevena Rozenberg  Can we get this patient to I think Dr. Eden Lathe to do a ultrasound guided injection at his Flexor tendons. He had MRI that showed Flexor tendonitis and STJ arthritis  Patient prefers if he has to drive to [place] and afternoon appointment Thanks Dr. Cannon Kettle

## 2018-10-25 NOTE — Telephone Encounter (Signed)
Referral to [facility], their office works the que not necessary to fax.

## 2018-10-26 DIAGNOSIS — J019 Acute sinusitis, unspecified: Secondary | ICD-10-CM | POA: Diagnosis not present

## 2018-10-26 DIAGNOSIS — J208 Acute bronchitis due to other specified organisms: Secondary | ICD-10-CM | POA: Diagnosis not present

## 2018-11-07 DIAGNOSIS — J208 Acute bronchitis due to other specified organisms: Secondary | ICD-10-CM | POA: Diagnosis not present

## 2018-11-11 ENCOUNTER — Telehealth: Payer: Self-pay | Admitting: Sports Medicine

## 2018-11-11 NOTE — Telephone Encounter (Signed)
Pt has ultrasound scheduled for 1/10 and would like to know what the next step is. Should pt schedule follow up appt? Please give pt a call.

## 2018-11-11 NOTE — Telephone Encounter (Signed)
I called pt and told him he would need to schedule an appt with Dr. Cannon Kettle for about 3-5 days after the MRI. Pt asked if his boot was suppose to deflate itself and I told him it should only deflate after he turned the button counter clockwise, if it was deflating he should go to the [place] office for assistance. Pt states understanding and I transferred to schedulers.

## 2018-11-19 ENCOUNTER — Telehealth: Payer: Self-pay | Admitting: Sports Medicine

## 2018-11-19 NOTE — Telephone Encounter (Signed)
1 month after his injection

## 2018-11-19 NOTE — Telephone Encounter (Signed)
Pt is having an injection ultrasound done and he need to know when to follow up with Dr. Cannon Kettle. He has an appointment on 11/28/2017 in [place]. Please call patient

## 2018-11-20 ENCOUNTER — Ambulatory Visit: Payer: Medicare Other | Admitting: Sports Medicine

## 2018-11-20 NOTE — Telephone Encounter (Signed)
I called pt and informed of Dr. Leeanne Rio recommendation. Pt asked if he should continue to wear the boot. I told pt that he should wear the boot until the [place] assisted injection, and ask them at that time.

## 2018-11-22 ENCOUNTER — Telehealth: Payer: Self-pay

## 2018-11-22 ENCOUNTER — Encounter: Payer: Self-pay | Admitting: Family Medicine

## 2018-11-22 ENCOUNTER — Ambulatory Visit: Payer: Medicare Other | Admitting: Family Medicine

## 2018-11-22 DIAGNOSIS — M25579 Pain in unspecified ankle and joints of unspecified foot: Secondary | ICD-10-CM | POA: Insufficient documentation

## 2018-11-22 DIAGNOSIS — M25571 Pain in right ankle and joints of right foot: Secondary | ICD-10-CM

## 2018-11-22 HISTORY — DX: Pain in unspecified ankle and joints of unspecified foot: M25.579

## 2018-11-22 MED ORDER — METHYLPREDNISOLONE ACETATE 40 MG/ML IJ SUSP
40.0000 mg | Freq: Once | INTRAMUSCULAR | Status: AC
  Administered 2018-11-22: 40 mg via INTRA_ARTICULAR

## 2018-11-22 MED ORDER — NITROGLYCERIN 0.2 MG/HR TD PT24: MEDICATED_PATCH | TRANSDERMAL | 12 refills | Status: DC

## 2018-11-22 NOTE — Telephone Encounter (Signed)
Nitroglycerin patch not covered by insurance. Can attempt PA once office visit note complete.  Danley Danker, RN [facility] RN)

## 2018-11-22 NOTE — Telephone Encounter (Signed)
Alisa I already took care of this. He was a sports med patient and it is already done--=-no need for [employer]. THANKS! Dorcas Mcmurray

## 2018-11-22 NOTE — Progress Notes (Addendum)
  Brandon Gallagher - 58 y.o. male MRN [id number]  Date of birth: Oct 14, 1961    SUBJECTIVE:      Chief Complaint:/ HPI:  Right ankle pain since September or October.  Has been followed by podiatrist and has been in a fracture walker boot since October.  Also had an injection which helped some.  Has not had much improvement since then.  Pain is on the medial portion of the ankle.  In the last 2 weeks he is also noticed some pain in the posterior portion of the calf and Achilles tendon.  It feels particularly tight he is walking.  No true calf pain. Pain is constant, sometimes it seems to radiate up his leg but that improved after the initial injection.  He is getting pretty frustrated with wearing the boot this long as he is not been able to do much driving with the boot.   ROS:     No fever.  No unusual numbness in his feet.  No lower extremity edema.  PERTINENT  PMH / PSH FH / / SH:  Past Medical, Surgical, Social, and Family History Reviewed & Updated in the EMR.  Pertinent findings include:  Has had 2 back surgeries. Has had ACDF 18 Structures sleep apnea Obesity No history of cardiovascular disease  History of migraines  OBJECTIVE: BP 121/76   Ht 5\' 11"  (1.803 m)   Wt 262 lb (118.8 kg)   BMI 36.54 kg/m   Physical Exam:  Vital signs are reviewed. General: Well-developed overweight male no acute distress GAIT: Antalgic with a limp on the right. Feet/ankles: Bilaterally significant pes planus.  On the right side he has hammertoes 2 through 5.  He is tender to palpation over the posterior tibial tendon area.  He has decreased flexibility in plantarflexion and dorsiflexion as well as inversion eversion of both ankles and it is fairly symmetrical. Calf: Bilateral symmetrical.  Nontender.  Negative Homans sign. Skin: Mild callus on the first metatarsal head area of the right foot.  Neuro: Intact sensation soft touch bilateral feet. vascular: Dorsalis pedis pulses are 2+ bilaterally  symmetrical.  He has normal capillary refill   Ultrasound: Significant amount of fluid surrounding the right posterior tibialis tendon throughout its course extending from the medial malleolus 4.  The tendon itself looks intact.   He has some osteophytes in the midfoot bones.  There is no significant effusion within the ankle. The Achilles tendon looks normal at its insertion site.  PROCEDURE: INJECTION: Patient was given informed consent, signed copy in the chart. Appropriate time out was taken. Area prepped and draped in usual sterile fashion. Ethyl chloride was  used for local anesthesia. A 21 gauge 1 1/2 inch needle was used.. 1 cc of methylprednisolone 40 mg/ml plus 1 cc of 1% lidocaine without epinephrine was injected into the tendon sheath of the right posterior tibial tendon using a(n) medial approach.   The patient tolerated the procedure well. There were no complications. Post procedure instructions were given.  ASSESSMENT & PLAN:  See problem based charting & AVS for pt instructions.

## 2018-11-22 NOTE — Patient Instructions (Signed)
TRY the nitro patch but discontinue if it causes bad headaches or triggers migraines. Wear the insole all of the time. Wear the air cast when you are walking of standiong a lot See me in 3-4 weeks Cut patch into one - fourth pieces Place a one fourth piece of patch on  skin over affected area, changing to a new piece every 24 hours.   You may experience a headache during the first 1-2 days and maybe up to 2 weeks of using the patch; these should improve and go away. If you experience headaches after beginning nitroglycerin patch treatment, you may take your preferred over the counter pain medicine such as tylenol or advil as directed on the box. Another side effect of the nitroglycerin patch can be skin irritation  or rash related to the patch adhesive.   Please notify our office if you develop  severe headaches or rash, and stop the patch.  Please call our office with any questions or problems. Tendon healing with nitroglycerin patch may require 12 to 24 weeks depending on the extent of injury. Men should not use if taking Viagra, Cialis, or Levitra as it may cause an unsafe lowering of the blood pressure..  Use with caution if you have migraines or rosacea.

## 2018-11-22 NOTE — Assessment & Plan Note (Signed)
Chronic posterior tibialis tendonitis Like to take him out of the boot.  I think starting to cause problems with Achilles tightening.  It also has really seem to make a difference.  Today we injected him.  We will start nitroglycerin patch therapy.  We will start decreased dose using 1/8 of a patch instead of one quarter patch.  No obvious instructions this is 1 he does not have any headaches after a few days he can move up to one quarter patch.  He has a lot of problems with headaches, discontinuing give [place] a call.  Placed him in an Aircast support.  Also inserted large scaphoid pad shoe.  I told him this would probably be in 2 to 3 months process to get some improvement.  Ultimately he would likely benefit from custom molded orthotics.  We may need to proceed with this sooner rather than later as he has a significant high arch to his foot and may need the stability of custom molded orthotics rather than the temporary.  I will see him back in 3 to 4 weeks.

## 2018-11-28 ENCOUNTER — Ambulatory Visit: Payer: Medicare Other | Admitting: Sports Medicine

## 2018-12-03 DIAGNOSIS — M542 Cervicalgia: Secondary | ICD-10-CM | POA: Diagnosis not present

## 2018-12-03 DIAGNOSIS — M791 Myalgia, unspecified site: Secondary | ICD-10-CM | POA: Diagnosis not present

## 2018-12-03 DIAGNOSIS — G43709 Chronic migraine without aura, not intractable, without status migrainosus: Secondary | ICD-10-CM | POA: Diagnosis not present

## 2018-12-20 ENCOUNTER — Ambulatory Visit: Payer: Medicare Other | Admitting: Family Medicine

## 2018-12-20 DIAGNOSIS — E119 Type 2 diabetes mellitus without complications: Secondary | ICD-10-CM | POA: Diagnosis not present

## 2018-12-25 ENCOUNTER — Ambulatory Visit: Payer: Medicare Other | Admitting: Sports Medicine

## 2018-12-30 DIAGNOSIS — J208 Acute bronchitis due to other specified organisms: Secondary | ICD-10-CM | POA: Diagnosis not present

## 2018-12-30 DIAGNOSIS — J019 Acute sinusitis, unspecified: Secondary | ICD-10-CM | POA: Diagnosis not present

## 2019-01-09 ENCOUNTER — Ambulatory Visit: Payer: Medicare Other | Admitting: Family Medicine

## 2019-01-09 ENCOUNTER — Encounter: Payer: Self-pay | Admitting: Family Medicine

## 2019-01-09 VITALS — BP 130/79 | Ht 71.0 in | Wt 264.0 lb

## 2019-01-09 DIAGNOSIS — M25571 Pain in right ankle and joints of right foot: Secondary | ICD-10-CM | POA: Diagnosis not present

## 2019-01-09 DIAGNOSIS — S93401A Sprain of unspecified ligament of right ankle, initial encounter: Secondary | ICD-10-CM

## 2019-01-09 DIAGNOSIS — M76821 Posterior tibial tendinitis, right leg: Secondary | ICD-10-CM | POA: Diagnosis not present

## 2019-01-09 DIAGNOSIS — M6788 Other specified disorders of synovium and tendon, other site: Secondary | ICD-10-CM

## 2019-01-09 NOTE — Patient Instructions (Signed)
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 Wear the boot as needed but transition out of this when tolerated to shoes with arch support (the pads Dr. Nori Riis placed). Continue nitro patches to the inside or back of the ankle, change daily. Start physical therapy and do home exercises on days you don't go to therapy. Follow up with me or Dr. Nori Riis in 6 weeks for reevaluation.

## 2019-01-09 NOTE — Progress Notes (Signed)
  Brandon Gallagher - 58 y.o. male MRN [id number]  Date of birth: [birth year]    SUBJECTIVE:      Chief Complaint: Right ankle pain  HPI:  58 year old male with chronic right ankle pain for the past year.  He has previously been treated for Achilles tendinopathy as well as posterior tibialis tendinopathy.  He had initially been wearing a walking boot but was transition to ankle brace at the last visit.  At the last visit he also had a steroid injection of the posterior tibialis tendon performed.  Additionally, he has been using nitroglycerin patches.  Overall he reports having "so-so" benefit.  Today he localizes this pain more to the Achilles tendon. Recently, approximately 5 days ago, he further injured her ankle after tripping over his dog.  He localizes this pain to the region of the ATFL.  He again has been wearing his walking boot for additional support.  He reports mild swelling.  No erythema or bruising. He has not been using any medications for pain.  No ice.  No localized erythema or bruising.  No skin changes.   ROS:     See HPI  PERTINENT  PMH / PSH FH / / SH:  Past Medical, Surgical, Social, and Family History Reviewed & Updated in the EMR.    OBJECTIVE: BP 130/79   Ht 5\' 11"  (1.803 m)   Wt 264 lb (119.7 kg)   BMI 36.82 kg/m   Physical Exam:  Vital signs are reviewed.  GEN: Alert and oriented, NAD Pulm: Breathing unlabored PSY: normal mood, congruent affect  MSK: Right ankle/foot: - Inspection: Hammertoe deformity of the first and second toes.  No erythema, swelling, or ecchymosis - Palpation: Negative [place] ankle/foot rules.  Tenderness over the ATFL.  Tenderness over the Achilles midsubstance.  Mild tenderness over the posterior tibialis tendon - Strength: Normal strength with dorsiflexion, plantarflexion, inversion, and eversion.  Anterior lateral ankle pain with resisted eversion.  Pain around the Achilles tendon with resisted plantarflexion - ROM: Range of motion is  decreased in dorsiflexion - Neuro/vasc: NV intact  - Special Tests: Negative anterior drawer. negative syndesmotic compression.  MSK [place]: Limited ultrasound of the right ankle: Achilles tendon: Intact.  There is perhaps some slight thickening near the mid substance.  No surrounding swelling.  No evidence of tears. Posterior tibialis tendon: No thickening or tears.  No tenosynovitis FHL: No thickening or tears.  No tenosynovitis Flexor digitorum longus: No thickening or tears.  No tenosynovitis ATFL: Small calcific changes seen within the tendon without obvious tearing.  No significant surrounding swelling Peroneus brevis/longus: No thickening or tears.  No tenosynovitis Tibiotalar joint: No effusion  Left foot: No tenderness to palpation Full range of motion with 5/5 strength N/V intact distally   ASSESSMENT & PLAN:  1.  Right ankle pain-chronically patient has evidence of mild Achilles and posterior tibialis tendinopathy.  More acutely, he has an ankle sprain. -Continue with walking boot and wean out as able with continued use of sneakers with arch support. -Discussed early range of motion exercises for the ankle sprain -Continue nitroglycerin patches.  Instructed him to keep these around the area of the posterior medial ankle -NSAIDs or Tylenol as needed -refer to physical therapy -Follow-up in 6 weeks

## 2019-01-10 ENCOUNTER — Encounter: Payer: Self-pay | Admitting: Family Medicine

## 2019-01-10 ENCOUNTER — Ambulatory Visit: Payer: Medicare Other | Admitting: Family Medicine

## 2019-01-17 DIAGNOSIS — G43709 Chronic migraine without aura, not intractable, without status migrainosus: Secondary | ICD-10-CM | POA: Diagnosis not present

## 2019-01-17 DIAGNOSIS — M542 Cervicalgia: Secondary | ICD-10-CM | POA: Diagnosis not present

## 2019-01-17 DIAGNOSIS — M791 Myalgia, unspecified site: Secondary | ICD-10-CM | POA: Diagnosis not present

## 2019-01-20 DIAGNOSIS — M25571 Pain in right ankle and joints of right foot: Secondary | ICD-10-CM | POA: Diagnosis not present

## 2019-01-20 DIAGNOSIS — M25471 Effusion, right ankle: Secondary | ICD-10-CM | POA: Diagnosis not present

## 2019-01-20 DIAGNOSIS — M25671 Stiffness of right ankle, not elsewhere classified: Secondary | ICD-10-CM | POA: Diagnosis not present

## 2019-01-20 DIAGNOSIS — S93401A Sprain of unspecified ligament of right ankle, initial encounter: Secondary | ICD-10-CM | POA: Diagnosis not present

## 2019-01-23 DIAGNOSIS — M25571 Pain in right ankle and joints of right foot: Secondary | ICD-10-CM | POA: Diagnosis not present

## 2019-01-23 DIAGNOSIS — M25471 Effusion, right ankle: Secondary | ICD-10-CM | POA: Diagnosis not present

## 2019-01-23 DIAGNOSIS — M25671 Stiffness of right ankle, not elsewhere classified: Secondary | ICD-10-CM | POA: Diagnosis not present

## 2019-01-23 DIAGNOSIS — S93401A Sprain of unspecified ligament of right ankle, initial encounter: Secondary | ICD-10-CM | POA: Diagnosis not present

## 2019-01-28 DIAGNOSIS — M25671 Stiffness of right ankle, not elsewhere classified: Secondary | ICD-10-CM | POA: Diagnosis not present

## 2019-01-28 DIAGNOSIS — S93401A Sprain of unspecified ligament of right ankle, initial encounter: Secondary | ICD-10-CM | POA: Diagnosis not present

## 2019-01-28 DIAGNOSIS — M25471 Effusion, right ankle: Secondary | ICD-10-CM | POA: Diagnosis not present

## 2019-01-28 DIAGNOSIS — M25571 Pain in right ankle and joints of right foot: Secondary | ICD-10-CM | POA: Diagnosis not present

## 2019-02-10 DIAGNOSIS — G43709 Chronic migraine without aura, not intractable, without status migrainosus: Secondary | ICD-10-CM | POA: Diagnosis not present

## 2019-02-10 DIAGNOSIS — M542 Cervicalgia: Secondary | ICD-10-CM | POA: Diagnosis not present

## 2019-02-10 DIAGNOSIS — M791 Myalgia, unspecified site: Secondary | ICD-10-CM | POA: Diagnosis not present

## 2019-02-14 ENCOUNTER — Ambulatory Visit: Payer: Medicare Other | Admitting: Family Medicine

## 2019-02-18 DIAGNOSIS — S93401A Sprain of unspecified ligament of right ankle, initial encounter: Secondary | ICD-10-CM | POA: Diagnosis not present

## 2019-02-18 DIAGNOSIS — M25571 Pain in right ankle and joints of right foot: Secondary | ICD-10-CM | POA: Diagnosis not present

## 2019-02-18 DIAGNOSIS — M25471 Effusion, right ankle: Secondary | ICD-10-CM | POA: Diagnosis not present

## 2019-02-18 DIAGNOSIS — M25671 Stiffness of right ankle, not elsewhere classified: Secondary | ICD-10-CM | POA: Diagnosis not present

## 2019-02-20 DIAGNOSIS — S93401A Sprain of unspecified ligament of right ankle, initial encounter: Secondary | ICD-10-CM | POA: Diagnosis not present

## 2019-02-20 DIAGNOSIS — M25471 Effusion, right ankle: Secondary | ICD-10-CM | POA: Diagnosis not present

## 2019-02-20 DIAGNOSIS — M25671 Stiffness of right ankle, not elsewhere classified: Secondary | ICD-10-CM | POA: Diagnosis not present

## 2019-02-20 DIAGNOSIS — M25571 Pain in right ankle and joints of right foot: Secondary | ICD-10-CM | POA: Diagnosis not present

## 2019-02-25 DIAGNOSIS — R143 Flatulence: Secondary | ICD-10-CM | POA: Diagnosis not present

## 2019-02-25 DIAGNOSIS — M25471 Effusion, right ankle: Secondary | ICD-10-CM | POA: Diagnosis not present

## 2019-02-25 DIAGNOSIS — M25571 Pain in right ankle and joints of right foot: Secondary | ICD-10-CM | POA: Diagnosis not present

## 2019-02-25 DIAGNOSIS — S93401A Sprain of unspecified ligament of right ankle, initial encounter: Secondary | ICD-10-CM | POA: Diagnosis not present

## 2019-02-25 DIAGNOSIS — K582 Mixed irritable bowel syndrome: Secondary | ICD-10-CM | POA: Diagnosis not present

## 2019-02-25 DIAGNOSIS — M25671 Stiffness of right ankle, not elsewhere classified: Secondary | ICD-10-CM | POA: Diagnosis not present

## 2019-02-27 DIAGNOSIS — L309 Dermatitis, unspecified: Secondary | ICD-10-CM | POA: Diagnosis not present

## 2019-02-28 ENCOUNTER — Ambulatory Visit: Payer: Medicare Other | Admitting: Family Medicine

## 2019-02-28 DIAGNOSIS — M25571 Pain in right ankle and joints of right foot: Secondary | ICD-10-CM | POA: Diagnosis not present

## 2019-02-28 DIAGNOSIS — S93401A Sprain of unspecified ligament of right ankle, initial encounter: Secondary | ICD-10-CM | POA: Diagnosis not present

## 2019-02-28 DIAGNOSIS — M25671 Stiffness of right ankle, not elsewhere classified: Secondary | ICD-10-CM | POA: Diagnosis not present

## 2019-02-28 DIAGNOSIS — M25471 Effusion, right ankle: Secondary | ICD-10-CM | POA: Diagnosis not present

## 2019-03-05 ENCOUNTER — Other Ambulatory Visit: Payer: Self-pay

## 2019-03-05 ENCOUNTER — Ambulatory Visit: Payer: Medicare Other | Admitting: Family Medicine

## 2019-03-05 ENCOUNTER — Encounter: Payer: Self-pay | Admitting: Family Medicine

## 2019-03-05 VITALS — BP 112/76 | Ht 71.0 in | Wt 263.0 lb

## 2019-03-05 DIAGNOSIS — M542 Cervicalgia: Secondary | ICD-10-CM | POA: Diagnosis not present

## 2019-03-05 DIAGNOSIS — M25571 Pain in right ankle and joints of right foot: Secondary | ICD-10-CM | POA: Diagnosis not present

## 2019-03-05 DIAGNOSIS — G43709 Chronic migraine without aura, not intractable, without status migrainosus: Secondary | ICD-10-CM | POA: Diagnosis not present

## 2019-03-05 DIAGNOSIS — M791 Myalgia, unspecified site: Secondary | ICD-10-CM | POA: Diagnosis not present

## 2019-03-05 NOTE — Patient Instructions (Addendum)
You can try the 1/4 nitro patch on the left achilles instead and change daily. Hold physical therapy for now though you can do home exercises still as long as they're not painful. 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 The arch supports are very important for this. Brace only as needed at this point. Follow up with me in 6 weeks for reevaluation.

## 2019-03-05 NOTE — Progress Notes (Signed)
  AURON TADROS - 58 y.o. male MRN [id number]  Date of birth: 1961-06-22    SUBJECTIVE:      Chief Complaint: Right ankle pain  HPI:   2/54: 58 year old male with chronic right ankle pain for the past year.  He has previously been treated for Achilles tendinopathy as well as posterior tibialis tendinopathy.  He had initially been wearing a walking boot but was transition to ankle brace at the last visit.  At the last visit he also had a steroid injection of the posterior tibialis tendon performed.  Additionally, he has been using nitroglycerin patches.  Overall he reports having "so-so" benefit.  Today he localizes this pain more to the Achilles tendon. Recently, approximately 5 days ago, he further injured her ankle after tripping over his dog.  He localizes this pain to the region of the ATFL.  He again has been wearing his walking boot for additional support.  He reports mild swelling.  No erythema or bruising. He has not been using any medications for pain.  No ice.  No localized erythema or bruising.  No skin changes.  4/22: Patient reports his ankle is a little better compared to last visit. His pain is no longer posterior right ankle or medial. Pain level is 3/10 and more lateral right ankle. Some pain posterior left heel now too. Doing physical therapy. Has not tried boot for this - has worn his ankle brace. Using nitro pad medial right ankle. No skin changes, numbness.  ROS:     See HPI  PERTINENT  PMH / PSH FH / / SH:  Past Medical, Surgical, Social, and Family History Reviewed & Updated in the EMR.    OBJECTIVE: BP 112/76   Ht 5\' 11"  (1.803 m)   Wt 263 lb (119.3 kg)   BMI 36.68 kg/m   VS reviewed. Gen: NAD, comfortable in exam room  Right ankle/foot: No gross deformity, swelling, ecchymoses FROM with 5/5 strength but pain on full IR and ER TTP over sinus tarsi.  No other tenderness. Negative ant drawer and talar tilt.   Negative syndesmotic compression.  Thompsons test negative. NV intact distally.  Left ankle/foot: No deformity. FROM with 5/5 strength. Mild tenderness to palpation achilles insertion.  No other tenderness. NVI distally.  ASSESSMENT & PLAN: 1.  Right ankle pain - patient's post tib and achilles tendinopathy have improved.  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  Encouraged arch supports regularly, icing.  Tylenol, nsaids, topical medications if needed.  Hold PT for now and stop nitro patches.  Consider custom orthotics.  F/u in 6 weeks.  2. Left heel pain - 2/2 mild achilles tendinopathy.  Advised to use nitro on this side and do home exercises.

## 2019-03-10 DIAGNOSIS — B356 Tinea cruris: Secondary | ICD-10-CM | POA: Diagnosis not present

## 2019-03-10 DIAGNOSIS — E785 Hyperlipidemia, unspecified: Secondary | ICD-10-CM | POA: Diagnosis not present

## 2019-03-10 DIAGNOSIS — R7301 Impaired fasting glucose: Secondary | ICD-10-CM | POA: Diagnosis not present

## 2019-03-28 DIAGNOSIS — L239 Allergic contact dermatitis, unspecified cause: Secondary | ICD-10-CM | POA: Diagnosis not present

## 2019-04-08 DIAGNOSIS — K582 Mixed irritable bowel syndrome: Secondary | ICD-10-CM | POA: Diagnosis not present

## 2019-04-16 ENCOUNTER — Ambulatory Visit: Payer: Medicare Other | Admitting: Family Medicine

## 2019-05-08 DIAGNOSIS — K582 Mixed irritable bowel syndrome: Secondary | ICD-10-CM | POA: Diagnosis not present

## 2019-05-09 DIAGNOSIS — K5732 Diverticulitis of large intestine without perforation or abscess without bleeding: Secondary | ICD-10-CM | POA: Diagnosis not present

## 2019-05-14 DIAGNOSIS — R197 Diarrhea, unspecified: Secondary | ICD-10-CM | POA: Diagnosis not present

## 2019-05-14 DIAGNOSIS — K5730 Diverticulosis of large intestine without perforation or abscess without bleeding: Secondary | ICD-10-CM | POA: Diagnosis not present

## 2019-05-14 DIAGNOSIS — K582 Mixed irritable bowel syndrome: Secondary | ICD-10-CM | POA: Diagnosis not present

## 2019-05-14 DIAGNOSIS — K5732 Diverticulitis of large intestine without perforation or abscess without bleeding: Secondary | ICD-10-CM | POA: Diagnosis not present

## 2019-05-22 DIAGNOSIS — R197 Diarrhea, unspecified: Secondary | ICD-10-CM | POA: Diagnosis not present

## 2019-05-22 DIAGNOSIS — K582 Mixed irritable bowel syndrome: Secondary | ICD-10-CM | POA: Diagnosis not present

## 2019-05-22 DIAGNOSIS — L300 Nummular dermatitis: Secondary | ICD-10-CM | POA: Diagnosis not present

## 2019-05-23 DIAGNOSIS — R197 Diarrhea, unspecified: Secondary | ICD-10-CM | POA: Diagnosis not present

## 2019-05-30 DIAGNOSIS — K589 Irritable bowel syndrome without diarrhea: Secondary | ICD-10-CM | POA: Diagnosis not present

## 2019-05-30 DIAGNOSIS — Z1159 Encounter for screening for other viral diseases: Secondary | ICD-10-CM | POA: Diagnosis not present

## 2019-06-03 ENCOUNTER — Encounter: Payer: Self-pay | Admitting: Gastroenterology

## 2019-06-05 DIAGNOSIS — K582 Mixed irritable bowel syndrome: Secondary | ICD-10-CM | POA: Diagnosis not present

## 2019-06-07 DIAGNOSIS — E785 Hyperlipidemia, unspecified: Secondary | ICD-10-CM | POA: Diagnosis not present

## 2019-06-07 DIAGNOSIS — R7301 Impaired fasting glucose: Secondary | ICD-10-CM | POA: Diagnosis not present

## 2019-06-16 ENCOUNTER — Other Ambulatory Visit: Payer: Self-pay

## 2019-06-16 ENCOUNTER — Other Ambulatory Visit (INDEPENDENT_AMBULATORY_CARE_PROVIDER_SITE_OTHER): Payer: Medicare Other

## 2019-06-16 ENCOUNTER — Ambulatory Visit (INDEPENDENT_AMBULATORY_CARE_PROVIDER_SITE_OTHER): Payer: Medicare Other | Admitting: Gastroenterology

## 2019-06-16 ENCOUNTER — Telehealth: Payer: Self-pay

## 2019-06-16 ENCOUNTER — Encounter: Payer: Self-pay | Admitting: Gastroenterology

## 2019-06-16 VITALS — BP 116/76 | HR 83 | Temp 98.6°F | Ht 71.0 in | Wt 259.0 lb

## 2019-06-16 DIAGNOSIS — R1310 Dysphagia, unspecified: Secondary | ICD-10-CM

## 2019-06-16 DIAGNOSIS — K219 Gastro-esophageal reflux disease without esophagitis: Secondary | ICD-10-CM | POA: Diagnosis not present

## 2019-06-16 DIAGNOSIS — R197 Diarrhea, unspecified: Secondary | ICD-10-CM

## 2019-06-16 LAB — C-REACTIVE PROTEIN: CRP: <1 mg/dL (ref 0.5–20.0)

## 2019-06-16 LAB — SEDIMENTATION RATE: Sed Rate: 6 mm/hr (ref 0–20)

## 2019-06-16 NOTE — Progress Notes (Signed)
Chief Complaint: diarrhea  Referring Provider:  Nicoletta Dress, MD      ASSESSMENT AND PLAN;   #1. Diarrhea, alt with constipation with lower abdo pain (neg colon by Dr Melina Copa, neg CT at [facility] 05/2019).  Previous H/O lymphocytic colitis-mild.  Previously diagnosed IBS.  #2. GERD with dysphagia d/t distal esophageal stricture/presbyesophagus s/p dil as below. H/O food impaction S/P endoscopic disimpaction 04/2016.  #3. H/O colonic polyps.  Plan: - Check celiac screen, CRP, Sed rate. - Records from Dr Melina Copa - Records from Dr Delena Bali.  Larena Glassman to get old records. - EGD with dil at [facility].  I have explained risks and benefits. - CT report from [facility] - Stool studies for GI Pathogen (includes C. Diff), WBCs, fecal elastase. - Trial of gluten free diet x 4 weeks - Continue omeprazole 20 mg p.o. twice daily for now. - FU after EGD.    Addendum: Colonoscopy report from Dr. Melina Copa 02/20/2018-4 small polyps measuring 4 to 6 mm s/p  Polypectomy. Bx- TAs.  Repeat in 3 years. CT 05/14/2019: Diverticulosis without diverticulitis, mild hepatic steatosis.  Otherwise normal. Reports sent for scanning. HPI:    Brandon Gallagher is a 58 y.o. male  4 months x diarrhea (1- 1.5 days, 3-4 loose BMs/day) and then constipation with associated lower abdominal pain which gets better with defecation.  Some associated abdominal bloating. Seen by Dr. Melina Copa.  Underwent CT scan Abdo/pelvis at [facility] which was negative last month.  Had colonoscopy by Dr. Melina Copa in 2019-negative per patient.  Records are awaited. Blood tests, stool studies were negative.  They did not do C. difficile and stool was solid. Dicyclomine-helps a little.  Equalactin did not.  He also had physical exam by Dr. Delena Bali recently and had blood tests performed which were negative.  Apparently had normal TSH.  Also has been having intermittent heartburn with occasional dysphagia to solids.  Denies having any fever chills or night  sweats.  No melena or hematochezia.  No weight loss.  No nonsteroidals.  No sodas.  No chocolates mints.  SH-wife Becky works with Dr. Delena Bali  Past GI procedures: -Colonoscopy 10/22/2017 ([facility])-colonic polyps (17) status post polypectomy (Bx- TA), moderate predominantly sigmoid diverticulosis.  Random colonic biopsies-mild lymphocytic colitis.  Refused genetic test. Colonoscopy 2019 (Dr. Hedy Jacob report. -EGD 06/08/2016-distal esophageal stricture s/p esophageal dilatation (54Fr), presbyesophagus, mild gastritis. Bx- neg for EoE, neg SB Bx. H/O food impaction status post endoscopic disimpaction 04/15/2016 Past Medical History:  Diagnosis Date  . Anxiety   . Arthritis   . Bronchitis    last had back in 2017   Sees no pulmonary md  . Depression   . Diverticulosis    last flare up was in Jan. 2018  Was on course of antibiotics for this  . Esophagus disorder    narrowing of esophagus.  Had to have dilatation 09/2016  . Family history of adverse reaction to anesthesia    son had severe N/V with his knee surgery  . GERD (gastroesophageal reflux disease)   . Headache   . History of Clostridioides difficile colitis   . History of colon polyps   . IBS (irritable bowel syndrome)   . Sleep apnea     Past Surgical History:  Procedure Laterality Date  . ANTERIOR CERVICAL DECOMP/DISCECTOMY FUSION N/A 01/01/2017   Procedure: Cervical four- five and Cervical six- seven Anterior cervical discectomy with fusion and plate fixation;  Surgeon: Kevan Ny Ditty, MD;  Location: [place];  Service:  Neurosurgery;  Laterality: N/A;  C4-5 and C6-7 Anterior cervical discectomy with fusion and plate fixation  . BACK SURGERY     x 2    @ [facility]        Last one 09/2015  . CARDIAC CATHETERIZATION     He thinks.   Not really sure where at, thought it was here @ [facility], but its been a LONG time ?2000  . COLONOSCOPY  10/22/2017   Colonic polyp status post polypectomy. Moderate predominantly sigmoid  diverticulosis.   . COLONOSCOPY  2019   Dr Orlena Sheldon  . ESOPHAGOGASTRODUODENOSCOPY  06/08/2016   Distal esophageal stricture status post esophageal dilatation. Presbtesophagus, also suggestive of motility disorder. Mild gastritis.   Marland Kitchen NASAL SINUS SURGERY     HAS HAD 2 OR 3.   LAST ONE 2011, IN [place]  (LEFT NASAL PASSAGE IS NARROWER THAN RIGHT    Family History  Problem Relation Age of Onset  . Heart disease Mother   . Colon cancer Neg Hx   . Esophageal cancer Neg Hx     Social History   Tobacco Use  . Smoking status: Former Smoker    Packs/day: 1.00    Years: 30.00    Pack years: 30.00    Types: Cigarettes    Quit date: 05/12/2000    Years since quitting: 19.1  . Smokeless tobacco: Never Used  Substance Use Topics  . Alcohol use: No    Alcohol/week: 0.0 standard drinks  . Drug use: No    Current Outpatient Medications  Medication Sig Dispense Refill  . ALBUTEROL IN Inhale 2 puffs into the lungs 2 (two) times daily as needed.    . ALPRAZolam (XANAX) 1 MG tablet Take 1 mg by mouth 2 (two) times daily as needed for anxiety.     Marland Kitchen ammonium lactate (LAC-HYDRIN) 12 % lotion 1 application daily.    Marland Kitchen dicyclomine (BENTYL) 20 MG tablet 1 tablet QID.    Marland Kitchen omeprazole (PRILOSEC) 20 MG capsule Take 20 mg by mouth 2 (two) times daily.    . nitroGLYCERIN (NITRODUR - DOSED IN MG/24 HR) 0.2 mg/hr patch Cut patch into one - fourth pieces Place a piece of patch on  skin over affected area of foot changing to a new piece every 24 hours. (Patient not taking: Reported on 06/16/2019) 10 patch 12   No current facility-administered medications for this visit.     Allergies  Allergen Reactions  . Iodinated Diagnostic Agents     Other reaction(s): NO ALLERGY  . Latex     Other reaction(s): NO ALLERGY  . No Known Allergies   . Shellfish-Derived Products     Other reaction(s): NO ALLERGY Patient stated that he is not allergic to this 06-16-2019    Review of Systems:  Constitutional: Denies  fever, chills, diaphoresis, appetite change and fatigue.  HEENT: Has multiple allergies.Marland Kitchen   Respiratory: Denies SOB, DOE, cough, chest tightness,  and wheezing.   Cardiovascular: Denies chest pain, palpitations and leg swelling.  Genitourinary: Denies dysuria, urgency, frequency, hematuria, flank pain and difficulty urinating.  Musculoskeletal: Has myalgias, back pain, joint swelling, arthralgias and gait problem.  Skin: No rash.  Neurological: Denies dizziness, seizures, syncope, weakness, light-headedness, numbness and headaches.  Hematological: Denies adenopathy. Easy bruising, personal or family bleeding history  Psychiatric/Behavioral: Has anxiety or depression, insomnia.     Physical Exam:    BP 116/76   Pulse 83   Temp 98.6 F (37 C)   Ht 5\' 11"  (1.803  m)   Wt 259 lb (117.5 kg)   BMI 36.12 kg/m  Filed Weights   06/16/19 1314  Weight: 259 lb (117.5 kg)   Constitutional:  Well-developed, in no acute distress. Psychiatric: Normal mood and affect. Behavior is normal. HEENT: Not examined, wearing mask. Neck supple.  Cardiovascular: Normal rate, regular rhythm. No edema Pulmonary/chest: Effort normal and breath sounds normal. No wheezing, rales or rhonchi. Abdominal: Soft, nondistended. Nontender. Bowel sounds active throughout. There are no masses palpable. No hepatomegaly. Rectal:  defered Neurological: Alert and oriented to person place and time. Skin: Skin is warm and dry. No rashes noted.  Multiple bruises.  Data Reviewed: I have personally reviewed following labs and imaging studies  CBC: CBC Latest Ref Rng & Units 12/28/2016  WBC 4.0 - 10.5 K/uL 7.8  Hemoglobin 13.0 - 17.0 g/dL 16.7  Hematocrit 39.0 - 52.0 % 49.6  Platelets 150 - 400 K/uL 224    CMP: CMP Latest Ref Rng & Units 12/28/2016  Glucose 65 - 99 mg/dL 85  BUN 6 - 20 mg/dL 8  Creatinine 0.61 - 1.24 mg/dL 0.93  Sodium 135 - 145 mmol/L 140  Potassium 3.5 - 5.1 mmol/L 3.8  Chloride 101 - 111 mmol/L  105  CO2 22 - 32 mmol/L 25  Calcium 8.9 - 10.3 mg/dL 9.8      Carmell Austria, MD 06/16/2019, 1:36 PM  Cc: Nicoletta Dress, MD

## 2019-06-16 NOTE — Patient Instructions (Addendum)
If you are age 59 or older, your body mass index should be between 23-30. Your Body mass index is 36.12 kg/m. If this is out of the aforementioned range listed, please consider follow up with your Primary Care Provider.  If you are age 47 or younger, your body mass index should be between 19-25. Your Body mass index is 36.12 kg/m. If this is out of the aformentioned range listed, please consider follow up with your Primary Care Provider.   Please go to the lab at [facility] ([place] You will need to go to level "B", you do not need an appointment for this. Hours available are 7:30 am - 4:30 pm.   You have been scheduled for an endoscopy. Please follow written instructions given to you at your visit today. If you use inhalers (even only as needed), please bring them with you on the day of your procedure. Your physician has requested that you go to www.startemmi.com and enter the access code given to you at your visit today. This web site gives a general overview about your procedure. However, you should still follow specific instructions given to you by our office regarding your preparation for the procedure.   Gluten-Free Diet for Celiac Disease, Adult  The gluten-free diet includes all foods that do not contain gluten. Gluten is a protein that is found in wheat, rye, barley, and some other grains. Following the gluten-free diet is the only treatment for people with celiac disease. It helps to prevent damage to the intestines and improves or eliminates the symptoms of celiac disease. Following the gluten-free diet requires some planning. It can be challenging at first, but it gets easier with time and practice. There are more gluten-free options available today than ever before. If you need help finding gluten-free foods or if you have questions, talk with your diet and nutrition specialist (registered dietitian) or your health care provider. What do I need to know about  a gluten-free diet?  All fruits, vegetables, and meats are safe to eat and do not contain gluten.  When grocery shopping, start by shopping in the produce, meat, and dairy sections. These sections are more likely to contain gluten-free foods. Then move to the aisles that contain packaged foods if you need to.  Read all food labels. Gluten is often added to foods. Always check the ingredient list and look for warnings, such as "may contain gluten."  Talk with your dietitian or health care provider before taking a gluten-free multivitamin or mineral supplement.  Be aware of gluten-free foods having contact with foods that contain gluten (cross-contamination). This can happen at home and with any processed foods. ? Talk with your health care provider or dietitian about how to reduce the risk of cross-contamination in your home. ? If you have questions about how a food is processed, ask the manufacturer. What key words help to identify gluten? Foods that list any of these key words on the label usually contain gluten:  Wheat, flour, enriched flour, bromated flour, white flour, durum flour, graham flour, phosphated flour, self-rising flour, semolina, farina, barley (malt), rye, and oats.  Starch, dextrin, modified food starch, or cereal.  Thickening, fillers, or emulsifiers.  Malt flavoring, malt extract, or malt syrup.  Hydrolyzed vegetable protein. In the [place] packaged foods that are gluten-free are required to be labeled "GF." These foods should be easy to identify and are safe to eat. In the [place] food companies are also required to list common food  allergens, including wheat, on their labels. Recommended foods Grains  Amaranth, bean flours, 100% buckwheat flour, corn, millet, nut flours or nut meals, GF oats, quinoa, rice, sorghum, teff, rice wafers, pure cornmeal tortillas, popcorn, and hot cereals made from cornmeal. Hominy, rice, wild rice. Some Asian rice noodles or bean noodles.  Arrowroot starch, corn bran, corn flour, corn germ, cornmeal, corn starch, potato flour, potato starch flour, and rice bran. Plain, brown, and sweet rice flours. Rice polish, soy flour, and tapioca starch. Vegetables  All plain fresh, frozen, and canned vegetables. Fruits  All plain fresh, frozen, canned, and dried fruits, and 100% fruit juices. Meats and other protein foods  All fresh beef, pork, poultry, fish, seafood, and eggs. Fish canned in water, oil, brine, or vegetable broth. Plain nuts and seeds, peanut butter. Some lunch meat and some frankfurters. Dried beans, dried peas, and lentils. Dairy  Fresh plain, dry, evaporated, or condensed milk. Cream, butter, sour cream, whipping cream, and most yogurts. Unprocessed cheese, most processed cheeses, some cottage cheese, some cream cheeses. Beverages  Coffee, tea, most herbal teas. Carbonated beverages and some root beers. Wine, sake, and distilled spirits, such as gin, vodka, and whiskey. Most hard ciders. Fats and oils  Butter, margarine, vegetable oil, hydrogenated butter, olive oil, shortening, lard, cream, and some mayonnaise. Some commercial salad dressings. Olives. Sweets and desserts  Sugar, honey, some syrups, molasses, jelly, and jam. Plain hard candy, marshmallows, and gumdrops. Pure cocoa powder. Plain chocolate. Custard and some pudding mixes. Gelatin desserts, sorbets, frozen ice pops, and sherbet. Cake, cookies, and other desserts prepared with allowed flours. Some commercial ice creams. Cornstarch, tapioca, and rice puddings. Seasoning and other foods  Some canned or frozen soups. Monosodium glutamate (MSG). Cider, rice, and wine vinegar. Baking soda and baking powder. Cream of tartar. Baking and nutritional yeast. Certain soy sauces made without wheat (ask your dietitian about specific brands that are allowed). Nuts, coconut, and chocolate. Salt, pepper, herbs, spices, flavoring extracts, imitation or artificial  flavorings, natural flavorings, and food colorings. Some medicines and supplements. Some lip glosses and other cosmetics. Rice syrups. The items listed may not be a complete list. Talk with your dietitian about what dietary choices are best for you. Foods to avoid Grains  Barley, bran, bulgur, couscous, cracked wheat, [place], farro, graham, malt, matzo, semolina, wheat germ, and all wheat and rye cereals including spelt and kamut. Cereals containing malt as a flavoring, such as rice cereal. Noodles, spaghetti, macaroni, most packaged rice mixes, and all mixes containing wheat, rye, barley, or triticale. Vegetables  Most creamed vegetables and most vegetables canned in sauces. Some commercially prepared vegetables and salads. Fruits  Thickened or prepared fruits and some pie fillings. Some fruit snacks and fruit roll-ups. Meats and other protein foods  Any meat or meat alternative containing wheat, rye, barley, or gluten stabilizers. These are often marinated or packaged meats and lunch meats. Bread-containing products, such as Swiss steak, croquettes, meatballs, and meatloaf. Most tuna canned in vegetable broth and [place] with hydrolyzed vegetable protein (HVP) injected as part of the basting. Seitan. Imitation fish. Eggs in sauces made from ingredients to avoid. Dairy  Commercial chocolate milk drinks and malted milk. Some non-dairy creamers. Any cheese product containing ingredients to avoid. Beverages  Certain cereal beverages. Beer, ale, malted milk, and some root beers. Some hard ciders. Some instant flavored coffees. Some herbal teas made with barley or with barley malt added. Fats and oils  Some commercial salad dressings. Sour cream containing modified food  starch. Sweets and desserts  Some toffees. Chocolate-coated nuts (may be rolled in wheat flour) and some commercial candies and candy bars. Most cakes, cookies, donuts, pastries, and other baked goods. Some commercial ice cream.  Ice cream cones. Commercially prepared mixes for cakes, cookies, and other desserts. Bread pudding and other puddings thickened with flour. Products containing brown rice syrup made with barley malt enzyme. Desserts and sweets made with malt flavoring. Seasoning and other foods  Some curry powders, some dry seasoning mixes, some gravy extracts, some meat sauces, some ketchups, some prepared mustards, and horseradish. Certain soy sauces. Malt vinegar. Bouillon and bouillon cubes that contain HVP. Some chip dips, and some chewing gum. Yeast extract. Brewer's yeast. Caramel color. Some medicines and supplements. Some lip glosses and other cosmetics. The items listed may not be a complete list. Talk with your dietitian about what dietary choices are best for you. Summary  Gluten is a protein that is found in wheat, rye, barley, and some other grains. The gluten-free diet includes all foods that do not contain gluten.  If you need help finding gluten-free foods or if you have questions, talk with your diet and nutrition specialist (registered dietitian) or your health care provider.  Read all food labels. Gluten is often added to foods. Always check the ingredient list and look for warnings, such as "may contain gluten." This information is not intended to replace advice given to you by your health care provider. Make sure you discuss any questions you have with your health care provider. Document Released: 10/30/2005 Document Revised: 10/12/2017 Document Reviewed: 08/14/2016 Elsevier Patient Education  [place].  Try Gluten Free diet x 4 weeks.   Thank you,  Dr. Jackquline Denmark

## 2019-06-16 NOTE — Telephone Encounter (Signed)
Covid-19 screening questions   Do you now or have you had a fever in the last 14 days? No  Do you have any respiratory symptoms of shortness of breath or cough now or in the last 14 days? No  Do you have any family members or close contacts with diagnosed or suspected Covid-19 in the past 14 days? No  Have you been tested for Covid-19 and found to be positive? Was tested and found to be negative    Was screened by Cancer center

## 2019-06-17 ENCOUNTER — Other Ambulatory Visit: Payer: Medicare Other

## 2019-06-17 DIAGNOSIS — K219 Gastro-esophageal reflux disease without esophagitis: Secondary | ICD-10-CM | POA: Diagnosis not present

## 2019-06-17 DIAGNOSIS — R1310 Dysphagia, unspecified: Secondary | ICD-10-CM | POA: Diagnosis not present

## 2019-06-17 DIAGNOSIS — R197 Diarrhea, unspecified: Secondary | ICD-10-CM | POA: Diagnosis not present

## 2019-06-18 LAB — CELIAC PANEL 10
Antigliadin Abs, IgA: 1 units (ref 0–19)
Endomysial IgA: NEGATIVE
Gliadin IgG: 2 units (ref 0–19)
IgA/Immunoglobulin A, Serum: <5 mg/dL — ABNORMAL LOW (ref 90–386)
Tissue Transglut Ab: <2 U/mL (ref 0–5)
Transglutaminase IgA: <2 U/mL (ref 0–3)

## 2019-06-19 ENCOUNTER — Telehealth: Payer: Self-pay | Admitting: Gastroenterology

## 2019-06-20 LAB — FECAL LACTOFERRIN, QUANT
Fecal Lactoferrin: POSITIVE — AB
MICRO NUMBER:: 735022
SPECIMEN QUALITY:: ADEQUATE

## 2019-06-20 LAB — GASTROINTESTINAL PATHOGEN PANEL PCR
C. difficile Tox A/B, PCR: NOT DETECTED
Campylobacter, PCR: NOT DETECTED
Cryptosporidium, PCR: NOT DETECTED
E coli (ETEC) LT/ST PCR: NOT DETECTED
E coli (STEC) stx1/stx2, PCR: NOT DETECTED
E coli 0157, PCR: NOT DETECTED
Giardia lamblia, PCR: NOT DETECTED
Norovirus, PCR: NOT DETECTED
Rotavirus A, PCR: NOT DETECTED
Salmonella, PCR: NOT DETECTED
Shigella, PCR: NOT DETECTED

## 2019-06-20 NOTE — Telephone Encounter (Signed)
Called and spoke with patient -patient informed of lab results not being resulted and reviewed by Dr. Lyndel Safe at this time; patient advised he will be notified when results have been reviewed by Dr. Lyndel Safe and information becomes available; patient verbalized understanding of information/instructions; patient advised to call back to the office should questions/concerns arise;  Larena Glassman- Dr. Melina Copa was supposed to be sending records to Dr. Ezra Sites you please follow up on this

## 2019-06-23 ENCOUNTER — Telehealth: Payer: Self-pay | Admitting: Gastroenterology

## 2019-06-23 NOTE — Telephone Encounter (Signed)

## 2019-06-23 NOTE — Telephone Encounter (Signed)
I have refaxed the request.

## 2019-06-24 ENCOUNTER — Ambulatory Visit (AMBULATORY_SURGERY_CENTER): Payer: Medicare Other | Admitting: Gastroenterology

## 2019-06-24 ENCOUNTER — Telehealth: Payer: Self-pay | Admitting: Gastroenterology

## 2019-06-24 ENCOUNTER — Other Ambulatory Visit: Payer: Self-pay

## 2019-06-24 ENCOUNTER — Encounter: Payer: Self-pay | Admitting: Gastroenterology

## 2019-06-24 VITALS — BP 118/70 | HR 58 | Temp 99.4°F | Resp 13 | Ht 71.0 in | Wt 259.0 lb

## 2019-06-24 DIAGNOSIS — K3189 Other diseases of stomach and duodenum: Secondary | ICD-10-CM

## 2019-06-24 DIAGNOSIS — K210 Gastro-esophageal reflux disease with esophagitis: Secondary | ICD-10-CM

## 2019-06-24 DIAGNOSIS — K317 Polyp of stomach and duodenum: Secondary | ICD-10-CM | POA: Diagnosis not present

## 2019-06-24 DIAGNOSIS — K219 Gastro-esophageal reflux disease without esophagitis: Secondary | ICD-10-CM | POA: Diagnosis not present

## 2019-06-24 DIAGNOSIS — R1310 Dysphagia, unspecified: Secondary | ICD-10-CM

## 2019-06-24 DIAGNOSIS — K228 Other specified diseases of esophagus: Secondary | ICD-10-CM

## 2019-06-24 MED ORDER — SODIUM CHLORIDE 0.9 % IV SOLN: 500.0000 mL | Freq: Once | INTRAVENOUS | Status: DC

## 2019-06-24 NOTE — Telephone Encounter (Signed)
Pt inquired about results if blood works.

## 2019-06-24 NOTE — Telephone Encounter (Signed)
Dr. Lyndel Safe has reviewed records.

## 2019-06-24 NOTE — Progress Notes (Signed)
Called to room to assist during endoscopic procedure.  Patient ID and intended procedure confirmed with present staff. Received instructions for my participation in the procedure from the performing physician.  

## 2019-06-24 NOTE — Progress Notes (Signed)
Pt's states no medical or surgical changes since previsit or office visit.  [place] temps and CW vitals.

## 2019-06-24 NOTE — Patient Instructions (Signed)
Await pathology results.  Return to GI clinic in 12 weeks.  Dilation diet - follow handout given to you.  YOU HAD AN ENDOSCOPIC PROCEDURE TODAY AT [facility]:   Refer to the procedure report that was given to you for any specific questions about what was found during the examination.  If the procedure report does not answer your questions, please call your gastroenterologist to clarify.  If you requested that your care partner not be given the details of your procedure findings, then the procedure report has been included in a sealed envelope for you to review at your convenience later.  YOU SHOULD EXPECT: Some feelings of bloating in the abdomen. Passage of more gas than usual.  Walking can help get rid of the air that was put into your GI tract during the procedure and reduce the bloating. If you had a lower endoscopy (such as a colonoscopy or flexible sigmoidoscopy) you may notice spotting of blood in your stool or on the toilet paper. If you underwent a bowel prep for your procedure, you may not have a normal bowel movement for a few days.  Please Note:  You might notice some irritation and congestion in your nose or some drainage.  This is from the oxygen used during your procedure.  There is no need for concern and it should clear up in a day or so.  SYMPTOMS TO REPORT IMMEDIATELY:    Following upper endoscopy (EGD)  Vomiting of blood or coffee ground material  New chest pain or pain under the shoulder blades  Painful or persistently difficult swallowing  New shortness of breath  Fever of 100F or higher  Black, tarry-looking stools  For urgent or emergent issues, a gastroenterologist can be reached at any hour by calling [phone].   DIET:  We do recommend a small meal at first, but then you may proceed to your regular diet.  Drink plenty of fluids but you should avoid alcoholic beverages for 24 hours.  ACTIVITY:  You should plan to take it easy for the rest  of today and you should NOT DRIVE or use heavy machinery until tomorrow (because of the sedation medicines used during the test).    FOLLOW UP: Our staff will call the number listed on your records 48-72 hours following your procedure to check on you and address any questions or concerns that you may have regarding the information given to you following your procedure. If we do not reach you, we will leave a message.  We will attempt to reach you two times.  During this call, we will ask if you have developed any symptoms of COVID 19. If you develop any symptoms (ie: fever, flu-like symptoms, shortness of breath, cough etc.) before then, please call [phone].  If you test positive for Covid 19 in the 2 weeks post procedure, please call and report this information to [place].    If any biopsies were taken you will be contacted by phone or by letter within the next 1-3 weeks.  Please call us at [phone] if you have not heard about the biopsies in 3 weeks.    SIGNATURES/CONFIDENTIALITY: You and/or your care partner have signed paperwork which will be entered into your electronic medical record.  These signatures attest to the fact that that the information above on your After Visit Summary has been reviewed and is understood.  Full responsibility of the confidentiality of this discharge information lies with you and/or your care-partner.

## 2019-06-24 NOTE — Op Note (Signed)
[facility] Patient Name: Brandon Gallagher Procedure Date: 06/24/2019 9:07 AM MRN: [id number] Endoscopist: Jackquline Denmark , MD Age: 58 Referring MD:  Date of Birth: 1961-05-09 Gender: Male Account #: [id number] Procedure:                Upper GI endoscopy Indications:              GERD with dysphagia. H/O food impaction S/P                            endoscopic disimpaction 04/2016. Prev eso Bx neg for                            EoE Medicines:                Monitored Anesthesia Care Procedure:                Pre-Anesthesia Assessment:                           - Prior to the procedure, a History and Physical                            was performed, and patient medications and                            allergies were reviewed. The patient's tolerance of                            previous anesthesia was also reviewed. The risks                            and benefits of the procedure and the sedation                            options and risks were discussed with the patient.                            All questions were answered, and informed consent                            was obtained. Prior Anticoagulants: The patient has                            taken no previous anticoagulant or antiplatelet                            agents. ASA Grade Assessment: II - A patient with                            mild systemic disease. After reviewing the risks                            and benefits, the patient was deemed in  satisfactory condition to undergo the procedure.                           After obtaining informed consent, the endoscope was                            passed under direct vision. Throughout the                            procedure, the patient's blood pressure, pulse, and                            oxygen saturations were monitored continuously. The                            Endoscope was introduced through the mouth, and                   advanced to the second part of duodenum. The upper                            GI endoscopy was accomplished without difficulty.                            The patient tolerated the procedure well. Scope In: Scope Out: Findings:                 The esophagus was mildly tortuous. A wide open                            distal esophageal stricture was noted at 40 cm from                            the incisors at the GE junction. Due to previous                            history of food impaction and dysphagia, we decided                            to dilate the esophagus. Dilation was performed                            with a Maloney dilator with mild resistance at 50                            Fr and 52 Fr. Estimated blood loss: none.                            Esophageal biopsies were not performed since                            previous biopsies were negative for eosinophilic  esophagitis.                           Two 6 to 8 mm sessile polyps with no bleeding and                            no stigmata of recent bleeding were found in the                            gastric body and in the gastric antrum. The polyp                            was removed with a hot snare. Resection and                            retrieval were complete. Estimated blood loss:                            none. Biopsies were taken with a cold forceps for                            histology.                           The examined duodenum was normal. Biopsies for                            histology were taken with a cold forceps for                            evaluation of celiac disease. Estimated blood loss:                            none. Complications:            No immediate complications. Estimated Blood Loss:     Estimated blood loss: none. Impression:               -Distal esophageal stricture s/p esophageal                            dilatation.                            -Gastric polyps s/p polypectomy.                           -Mild presbyesophagus. Recommendation:           - Patient has a contact number available for                            emergencies. The signs and symptoms of potential                            delayed complications were discussed with the  patient. Return to normal activities tomorrow.                            Written discharge instructions were provided to the                            patient.                           - Post dilatation diet.                           - Continue present medications.                           - Await pathology results.                           - Return to GI clinic in 12 weeks.                           - D/w Becky over the phone. Jackquline Denmark, MD 06/24/2019 9:34:11 AM This report has been signed electronically.

## 2019-06-24 NOTE — Progress Notes (Signed)
Report given to PACU, vss 

## 2019-06-25 NOTE — Telephone Encounter (Signed)
Please review blood work results and advise

## 2019-06-25 NOTE — Telephone Encounter (Signed)
Pt is calling back about his labwork results and also stated that the dicyclomine is not working for the cramps.

## 2019-06-25 NOTE — Telephone Encounter (Signed)
Left message for patient to call back to the office for additional information;  Please advise of options for this patient/plan of care;

## 2019-06-26 ENCOUNTER — Telehealth: Payer: Self-pay | Admitting: *Deleted

## 2019-06-26 NOTE — Telephone Encounter (Signed)
I believe we did get records from Dr. Carmie End office [place] [place], can you make sure Inform pt  Thx RG

## 2019-06-26 NOTE — Telephone Encounter (Signed)
  Follow up Call-  Call back number 06/24/2019  Post procedure Call Back phone  # [phone]  Permission to leave phone message Yes  Some recent data might be hidden     Patient questions:  Do you have a fever, pain , or abdominal swelling? No. Pain Score  0 *  Have you tolerated food without any problems? No.  Have you been able to return to your normal activities? Yes.    Do you have any questions about your discharge instructions: Diet   No. Medications  No. Follow up visit  No.  Do you have questions or concerns about your Care? No.  Actions: * If pain score is 4 or above: No action needed, pain <4.  1. Have you developed a fever since your procedure? no  2.   Have you had an respiratory symptoms (SOB or cough) since your procedure? No  3.   Have you tested positive for COVID 19 since your procedure no  4.   Have you had any family members/close contacts diagnosed with the COVID 19 since your procedure?  no   If yes to any of these questions please route to Joylene John, RN and Alphonsa Gin, Therapist, sports.

## 2019-06-26 NOTE — Telephone Encounter (Signed)
Pt called regarding results and also inquired whether records were received from Dr. Carmie End office.

## 2019-06-26 NOTE — Telephone Encounter (Signed)
We did receive records, patient was informed.

## 2019-06-26 NOTE — Telephone Encounter (Signed)
Please advise 

## 2019-06-27 ENCOUNTER — Telehealth: Payer: Self-pay | Admitting: Gastroenterology

## 2019-06-27 DIAGNOSIS — R109 Unspecified abdominal pain: Secondary | ICD-10-CM

## 2019-06-27 DIAGNOSIS — D802 Selective deficiency of immunoglobulin A [IgA]: Secondary | ICD-10-CM | POA: Diagnosis not present

## 2019-06-27 MED ORDER — TRAMADOL-ACETAMINOPHEN 37.5-325 MG PO TABS: 1.0000 | ORAL_TABLET | Freq: Three times a day (TID) | ORAL | 0 refills | Status: DC | PRN

## 2019-06-27 NOTE — Telephone Encounter (Signed)
patient is requesting lab results. He is also wanting to speak to a nurse regarding dicyclomine.  Patient says that he is very upset that he has not received a call yet and is requesting to speak to a supervisor.

## 2019-06-27 NOTE — Telephone Encounter (Signed)
I returned the patient's call.  He is upset that he we have not relayed the results to him from the labs last week.  Labs were reviewed last week.  I relayed the results to the patient and apologized for the delay.  I discussed with Dr. Lyndel Safe about dicyclomine is not helping.  Per Dr. Lyndel Safe patient needs Ultracet TID PRN abdominal pain #30 0 refill/.  Rx will be sent to the pharmacy.  Patient is asked to call with an update in one week on status of cramping.    Bri please contact Dr. Delena Bali next week and obtain a copy of the blood work from today and get a copy for Dr. Lyndel Safe next week.

## 2019-06-27 NOTE — Telephone Encounter (Signed)
RX printed and faxed to [facility] (pharmacy on patient's profile to send RX to); fax confirmation received from pharmacy;

## 2019-06-27 NOTE — Telephone Encounter (Signed)
Patient notified of the response from Dr. Lyndel Safe.  He wanted Dr. Lyndel Safe to know that he will see an allergist next Thursday in [place].

## 2019-06-28 ENCOUNTER — Encounter: Payer: Self-pay | Admitting: Gastroenterology

## 2019-06-29 ENCOUNTER — Telehealth: Payer: Self-pay | Admitting: Gastroenterology

## 2019-06-29 NOTE — Telephone Encounter (Signed)
Returned patient phone call.  Had EGD with Dr. Lyndel Safe on August 11 with dilation of distal esophageal stricture and biopsies of gastric polyps.  Says that today he started developing a sore throat and wanting to know if he could use throat lozengers.  Says that his son has strep and does not know if he is starting with that.  I told him it was fine to use a throat lozengers.  He also wanted me to pass on to Dr. Lyndel Safe that the pain medicine that he gave him for his abdominal pain/cramping is not helping.  I told him that I would pass this message along and that they could address this on Monday or early next week.

## 2019-06-30 NOTE — Telephone Encounter (Addendum)
Called and spoke with patient- patient informed of MD recommendations; patient advised that lab work from Dr. Denton Lank office is on Dr. Steve Rattler desk for review;  Patient advised to call back to the office should questions/concerns arise; patient verbalized understanding of information/instructions;

## 2019-06-30 NOTE — Telephone Encounter (Signed)
Lab work results have been placed on Dr. Steve Rattler desk for review;

## 2019-06-30 NOTE — Telephone Encounter (Signed)
Bre,  If he still has sore throat, he should get in touch with Dr. Delena Bali and get strep tested Continue Ultracet for now and let see how he does. If he needs any narcotics, he has to get in touch with Dr. Jennette Dubin  [place]

## 2019-07-01 NOTE — Telephone Encounter (Signed)
Lab work reviewed. He has isolated IgA deficiency.  IgG and IgM immunoglobulin was normal. He has appointment with Dr. Neldon Mc.  Thx  RG

## 2019-07-02 NOTE — Telephone Encounter (Signed)
Called and spoke with patient-patient informed of MD recommendations; patient is agreeable with plan of care and will keep appt with Dr. Neldon Mc on 07/03/2019; patient has requested a f/u appt be made-patient is scheduled on 07/28/2019 at 9:10 am; Patient verbalized understanding of information/instructions;  Patient was advised to call the office at [phone] if questions/concerns arise;

## 2019-07-02 NOTE — Telephone Encounter (Signed)
Patient called back to the office-patient is requesting to know patho results from upper endo; results given from letter sent from Dr. Lyndel Safe;    Dr. Gupta=Patient is wanting to know if the multiple polyps he has had on the last few EGD/colonoscopy "are a sign of something down the road can happen?" can they be indicative of stomach/colon cancer down the road in life"?

## 2019-07-02 NOTE — Telephone Encounter (Signed)
Called and spoke with patient-patient informed of result note and MD recommendations; patient is agreeable with plan of care; Patient verbalized understanding of information/instructions;  Patient was advised to call the office at [phone] if questions/concerns arise;   Dr. Gupta=Patient is wanting to know if the abdominal pain and cramping is related to the IgA deficiency- is it also related to when going to the bathroom and then after using the restroom (pain is worse at these times); "I am just needing to know for when I go to see Dr. Neldon Mc tomorrow"

## 2019-07-02 NOTE — Telephone Encounter (Signed)
Abdominal pain is likely not related to IgA deficiency. Likely due to colonic spasms. He should keep appointment with Dr. Neldon Mc tomorrow. For the gastric polyps-they were hyperplastic.  They never turn to cancer. I have sent a letter.  Thx  RG

## 2019-07-03 ENCOUNTER — Encounter: Payer: Self-pay | Admitting: Allergy and Immunology

## 2019-07-03 ENCOUNTER — Other Ambulatory Visit: Payer: Self-pay

## 2019-07-03 ENCOUNTER — Telehealth: Payer: Self-pay | Admitting: Gastroenterology

## 2019-07-03 ENCOUNTER — Ambulatory Visit (INDEPENDENT_AMBULATORY_CARE_PROVIDER_SITE_OTHER): Payer: Medicare Other | Admitting: Allergy and Immunology

## 2019-07-03 VITALS — BP 120/80 | HR 100 | Temp 97.6°F | Resp 18 | Ht 71.0 in | Wt 256.8 lb

## 2019-07-03 DIAGNOSIS — J324 Chronic pansinusitis: Secondary | ICD-10-CM

## 2019-07-03 DIAGNOSIS — J3089 Other allergic rhinitis: Secondary | ICD-10-CM

## 2019-07-03 DIAGNOSIS — R197 Diarrhea, unspecified: Secondary | ICD-10-CM

## 2019-07-03 DIAGNOSIS — K219 Gastro-esophageal reflux disease without esophagitis: Secondary | ICD-10-CM

## 2019-07-03 DIAGNOSIS — J4540 Moderate persistent asthma, uncomplicated: Secondary | ICD-10-CM

## 2019-07-03 DIAGNOSIS — B999 Unspecified infectious disease: Secondary | ICD-10-CM | POA: Diagnosis not present

## 2019-07-03 DIAGNOSIS — D801 Nonfamilial hypogammaglobulinemia: Secondary | ICD-10-CM | POA: Diagnosis not present

## 2019-07-03 DIAGNOSIS — R109 Unspecified abdominal pain: Secondary | ICD-10-CM

## 2019-07-03 MED ORDER — SYMBICORT 160-4.5 MCG/ACT IN AERO: INHALATION_SPRAY | RESPIRATORY_TRACT | 5 refills | Status: DC

## 2019-07-03 MED ORDER — MONTELUKAST SODIUM 10 MG PO TABS: 10.0000 mg | ORAL_TABLET | Freq: Every day | ORAL | 5 refills | Status: DC

## 2019-07-03 MED ORDER — FAMOTIDINE 20 MG PO TABS: 20.0000 mg | ORAL_TABLET | Freq: Two times a day (BID) | ORAL | 8 refills | Status: DC

## 2019-07-03 NOTE — Patient Instructions (Addendum)
  1.  Allergen avoidance measures  2.  Treat and prevent inflammation:   A.  OTC Nasacort-1 spray each nostril 1 time per day  B.  Symbicort 160- 2 inhalations 1-2  times per day     C.  Montelukast 10 mg - 1 tablet 1 time per day  3.  If needed:   A.  Nasal saline  B.  Albuterol HFA-2 inhalations every 4-6 hours  4.  Blood - IgA/G/M, IgE, anti-pneumococcal antibody, antitetanus and diphtheria antibody  5.  Chronic infection of GI tract?  6.  Try Famotidine 20 mg 2 times per day to replace omeprazole. Helps?  7. Return to clinic in 4 weeks or earlier if problem

## 2019-07-03 NOTE — Progress Notes (Signed)
[place] [facility] [place] [facility] [place] -    Dear Dr. Delena Bali,  Thank you for referring Brandon Gallagher to the [facility] [facility] on 07/03/2019.   Below is a summation of this patient's evaluation and recommendations.  Thank you for your referral. I will keep you informed about this patient's response to treatment.   If you have any questions please do not hesitate to contact me.   Sincerely,  Jiles Prows, MD Allergy / Immunology [facility]   ______________________________________________________________________    NEW PATIENT NOTE  Referring Provider: Nicoletta Dress, MD Primary Provider: Nicoletta Dress, MD Date of office visit: 07/03/2019    Subjective:   Chief Complaint:  Brandon Gallagher (DOB: 1961-08-30) is a 58 y.o. male who presents to the clinic on 07/03/2019 with a chief complaint of IgA deficiency .     HPI: Brandon Gallagher presents to this clinic in evaluation of multiple issues.  First, he is apparently been told that he has an IgA deficiency.  Second, he does have a respiratory history with a requirement for 3 sinus surgeries for chronic sinusitis with his last surgery being performed about 3 years ago.  He has stuffiness and green nasal discharge and scratchy throat and postnasal drip.  He can smell without any difficulty and headaches are not a particularly big issue.  Third, he states that he gets "bronchitis" manifested as coughing and wheezing and shortness of breath and phlegm production.  This happens multiple times per year.  He has been given an albuterol inhaler in the past.  He does have a smoking history from the age of 40 to the age of 23 at about 1 pack/day.  Fourth, he has a large requirement for administration of systemic steroids and antibiotics.  He states that in 2020 he has been treated with 8 courses of antibiotics and multiple courses of  systemic steroid injections for his respiratory issue.  Fifth, he has a chronic GI issue with pain and gas and diarrhea and constipation that has gotten out of hand over the course of the past 5 months.  He has had chronic GI issues for years but something has changed over the course of the past 5 months.  Apparently has had multiple studies investigating this issue including an evaluation of gluten hypersensitivity and it was at this point in time that he was determined to have an IgA deficiency.  He does not consume any caffeine because of a migraine headache issue.  Past Medical History:  Diagnosis Date   Anxiety    Arthritis    Bronchitis    last had back in 2017   Sees no pulmonary md   Depression    Diverticulosis    last flare up was in Jan. 2018  Was on course of antibiotics for this   Esophagus disorder    narrowing of esophagus.  Had to have dilatation 09/2016   Family history of adverse reaction to anesthesia    son had severe N/V with his knee surgery   GERD (gastroesophageal reflux disease)    Headache    History of Clostridioides difficile colitis    History of colon polyps    IBS (irritable bowel syndrome)    Sleep apnea     Past Surgical History:  Procedure Laterality Date   ANTERIOR CERVICAL DECOMP/DISCECTOMY FUSION N/A 01/01/2017   Procedure: Cervical four- five and Cervical six- seven  Anterior cervical discectomy with fusion and plate fixation;  Surgeon: Kevan Ny Ditty, MD;  Location: [facility];  Service: Neurosurgery;  Laterality: N/A;  C4-5 and C6-7 Anterior cervical discectomy with fusion and plate fixation   BACK SURGERY     x 2    @ [facility]        Last one 09/2015   CARDIAC CATHETERIZATION     He thinks.   Not really sure where at, thought it was here @ [facility], but its been a LONG time ?2000   COLONOSCOPY  10/22/2017   Colonic polyp status post polypectomy. Moderate predominantly sigmoid diverticulosis.    COLONOSCOPY  2019   Dr Orlena Sheldon     ESOPHAGOGASTRODUODENOSCOPY  06/08/2016   Distal esophageal stricture status post esophageal dilatation. Presbtesophagus, also suggestive of motility disorder. Mild gastritis.    NASAL SINUS SURGERY     HAS HAD 2 OR 3.   LAST ONE 2011, IN [place]  (LEFT NASAL PASSAGE IS NARROWER THAN RIGHT    Allergies as of 07/03/2019      Reactions   Iodinated Diagnostic Agents    Other reaction(s): NO ALLERGY   Latex    Other reaction(s): NO ALLERGY      Medication List    ALBUTEROL IN Inhale 2 puffs into the lungs 2 (two) times daily as needed.   ALPRAZolam 1 MG tablet Commonly known as: XANAX Take 1 mg by mouth 2 (two) times daily as needed for anxiety.   ammonium lactate 12 % lotion Commonly known as: LAC-HYDRIN 1 application daily.   dicyclomine 20 MG tablet Commonly known as: BENTYL 1 tablet QID.   omeprazole 20 MG capsule Commonly known as: PRILOSEC Take 20 mg by mouth 2 (two) times daily.   traMADol-acetaminophen 37.5-325 MG tablet Commonly known as: Ultracet Take 1 tablet by mouth 3 (three) times daily as needed.       Review of systems negative except as noted in HPI / PMHx or noted below:  Review of Systems  Constitutional: Negative.   HENT: Negative.   Eyes: Negative.   Respiratory: Negative.   Cardiovascular: Negative.   Gastrointestinal: Negative.   Genitourinary: Negative.   Musculoskeletal: Negative.   Skin: Negative.   Neurological: Negative.   Endo/Heme/Allergies: Negative.   Psychiatric/Behavioral: Negative.     Family History  Problem Relation Age of Onset   Heart disease Mother    Stroke Father    Colon cancer Neg Hx    Esophageal cancer Neg Hx    Stomach cancer Neg Hx    Diabetes Neg Hx     Social History   Socioeconomic History   Marital status: Married    Spouse name: Not on file   Number of children: 3   Years of education: Not on file   Highest education level: Not on file  Occupational History   Occupation:  Disablilty  Social Designer, fashion/clothing strain: Not on file   Food insecurity    Worry: Not on file    Inability: Not on file   Transportation needs    Medical: Not on file    Non-medical: Not on file  Tobacco Use   Smoking status: Former Smoker    Packs/day: 1.00    Years: 30.00    Pack years: 30.00    Types: Cigarettes    Quit date: 05/12/2000    Years since quitting: 19.1   Smokeless tobacco: Never Used  Substance and Sexual Activity   Alcohol use: No  Alcohol/week: 0.0 standard drinks   Drug use: No   Sexual activity: Not on file  Lifestyle   Physical activity    Days per week: Not on file    Minutes per session: Not on file   Stress: Not on file  Relationships   Social connections    Talks on phone: Not on file    Gets together: Not on file    Attends religious service: Not on file    Active member of club or organization: Not on file    Attends meetings of clubs or organizations: Not on file    Relationship status: Not on file   Intimate partner violence    Fear of current or ex partner: Not on file    Emotionally abused: Not on file    Physically abused: Not on file    Forced sexual activity: Not on file  Other Topics Concern   Not on file  Social History Narrative   Not on file    Environmental and Social history  Lives in a house with a dry environment, a cat and dog located inside the household, carpet in the bedroom, plastic on the bed, no plastic on the pillow, and no smoking ongoing with inside the household.  He is disabled secondary to a musculoskeletal issue.  Objective:   Vitals:   07/03/19 0936  BP: 120/80  Pulse: 100  Resp: 18  Temp: 97.6 F (36.4 C)  SpO2: 94%   Height: 5\' 11"  (180.3 cm) Weight: 256 lb 12.8 oz (116.5 kg)  Physical Exam Constitutional:      Appearance: He is not diaphoretic.  HENT:     Head: Normocephalic. No right periorbital erythema or left periorbital erythema.     Right Ear: Tympanic  membrane, ear canal and external ear normal.     Left Ear: Tympanic membrane, ear canal and external ear normal.     Nose: Nose normal. No mucosal edema or rhinorrhea.     Mouth/Throat:     Pharynx: No oropharyngeal exudate.  Eyes:     General: Lids are normal.     Conjunctiva/sclera: Conjunctivae normal.     Pupils: Pupils are equal, round, and reactive to light.  Neck:     Thyroid: No thyromegaly.     Trachea: Trachea normal. No tracheal deviation.  Cardiovascular:     Rate and Rhythm: Normal rate and regular rhythm.     Heart sounds: Normal heart sounds, S1 normal and S2 normal. No murmur.  Pulmonary:     Effort: Pulmonary effort is normal. No respiratory distress.     Breath sounds: No stridor. No wheezing or rales.  Chest:     Chest wall: No tenderness.  Abdominal:     General: There is no distension.     Palpations: Abdomen is soft. There is no mass.     Tenderness: There is no abdominal tenderness. There is no guarding or rebound.  Musculoskeletal:        General: No tenderness.  Lymphadenopathy:     Head:     Right side of head: No tonsillar adenopathy.     Left side of head: No tonsillar adenopathy.     Cervical: No cervical adenopathy.  Skin:    Coloration: Skin is not pale.     Findings: No erythema or rash.     Nails: There is no clubbing.   Neurological:     Mental Status: He is alert.     Diagnostics: Allergy skin tests  were performed.  He had slight hypersensitivity directed against mold.  Spirometry was performed and demonstrated an FEV1 of 3.34 @ 88 % of predicted. FEV1/FVC = 0.87  Review of blood tests obtained 16 June 2019 identifies IgA less than 5 mg/DL.  Review of blood tests obtained 08 December 2018 identifies WBC 8.2, absolute eosinophil 100, absolute lymphocyte 2000, hemoglobin 17.8.  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 Recurrent infections   3. Not well controlled moderate persistent asthma   4. Perennial  allergic rhinitis   5. Chronic pansinusitis   6. Gastroesophageal reflux disease, esophagitis presence not specified     1.  Allergen avoidance measures  2.  Treat and prevent inflammation:   A.  OTC Nasacort-1 spray each nostril 1 time per day  B.  Symbicort 160- 2 inhalations 1-2  times per day     C.  Montelukast 10 mg - 1 tablet 1 time per day  3.  If needed:   A.  Nasal saline  B.  Albuterol HFA-2 inhalations every 4-6 hours  4.  Blood - IgA/G/M, IgE, anti-pneumococcal antibody, antitetanus and diphtheria antibody  5.  Chronic infection of GI tract?  6.  Try Famotidine 20 mg 2 times per day to replace omeprazole. Helps?  7.  Return to clinic in 4 weeks or earlier if problem  Brandon Gallagher appears to have a history of hypogammaglobulinemia with documented low IgA levels, history of recurrent respiratory tract infections requiring 2 sinus surgeries, and a history of chronic gastrointestinal symptoms.  He very well could have a form of CVID and we will further investigate that issue with the blood tests noted above.  I have given him preventative anti-inflammatory agents for his airway as noted above.  It is quite possible he is carrying a chronic infection of his GI tract with bacterial overgrowth or he may have a lymphoid infiltration of his GI tract secondary to possible CVID and there is the possibility that he has developed a side effect from the use of his proton pump inhibitor which we will address by having him try a few weeks of famotidine in the place of his PPI.  I will regroup with him in 4 weeks to assess his response to the plan noted above.  Jiles Prows, MD Allergy / Immunology [facility]

## 2019-07-03 NOTE — Telephone Encounter (Signed)
Called and spoke with patient-patient is requesting to know what lab work Dr. Neldon Mc has ordered for the patient to complete-wanted to make sure it was not a repeat of the lab work that he just completed as requested by Dr. Lyndel Safe; assured patient of lab work being requested and patient "seemed" satisfied with the name of the labs that Dr. Neldon Mc has ordered -no duplicates noted at this time; patient advised to call back to the office should questions/concerns arise; patient verbalized understanding of the information/instructions;

## 2019-07-04 ENCOUNTER — Telehealth: Payer: Self-pay | Admitting: *Deleted

## 2019-07-04 ENCOUNTER — Encounter: Payer: Self-pay | Admitting: *Deleted

## 2019-07-04 DIAGNOSIS — R1030 Lower abdominal pain, unspecified: Secondary | ICD-10-CM | POA: Diagnosis not present

## 2019-07-04 NOTE — Telephone Encounter (Signed)
Patient called and advised that he is having some stomach pain.  I advised patient that you were not in office and I would let him know on Monday but he should contact his PCP Dr Olean Ree regarding his pain.  He advised he would but just wanted to let you know also.

## 2019-07-07 ENCOUNTER — Encounter: Payer: Self-pay | Admitting: Allergy and Immunology

## 2019-07-07 ENCOUNTER — Telehealth: Payer: Self-pay | Admitting: Allergy and Immunology

## 2019-07-07 NOTE — Telephone Encounter (Signed)
ERROR

## 2019-07-07 NOTE — Telephone Encounter (Signed)
Called and spoke with patient- patient reports he is still having abd pain "pretty bad"; patient is wanting to know if him having so many polyps is the reason for his pain- and if you have reviewed his lab results from the labs he had drawn on 07/03/2019 -Dr. Neldon Mc has already reviewed these results with the patient- Wanting to know if you will please call Dr. Neldon Mc to plan treatment for this patient=

## 2019-07-07 NOTE — Telephone Encounter (Signed)
Brandon Gallagher called in and stated he has an ongoing stomach "issue" with stomach pains.  This has been going on for approx 5 months.  Brandon Gallagher states Dr. Lyndel Safe and Dr. Delena Bali are aware of this issue and that was one of the main reasons he was referred to Dr. Neldon Mc.  Brandon Gallagher states he didn't get to talk much about his stomach issues with Dr. Neldon Mc.   Brandon Gallagher states Dr. Delena Bali gave him some pain medication the other day and none of it is working.  Brandon Gallagher would like to know Dr. Bruna Potter opinion on the matter.  Please advise.

## 2019-07-08 NOTE — Telephone Encounter (Signed)
Have talked to the patient in detail Have reviewed Dr. Bruna Potter notes as well.  Intermittent abdominal pain.  Negative extensive work-up including CT, colonoscopy, EGD.  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  Stool studies were negative including Giardia.  Still with intermittent diarrhea.  Plan: -Recheck stool studies for Giardia antigen, fecal elastase, WBCs and GI pathogens. -Breath test for SIBO (small intestinal bacterial overgrowth) -Proceed with flexible sigmoidoscopy with biopsies to rule out microscopic colitis.  He recently had full colonoscopy by Dr. Melina Copa.  I do not think we need to repeat colonoscopy. -Follow-up in the GI clinic thereafter.  RG

## 2019-07-08 NOTE — Telephone Encounter (Signed)
Called and spoke with the patient-concerning the requests/recommendations per Dr. Lyndel Safe; Orders placed for stool specimens and flex sigmoidoscopy scheduled for patient at this time on 07/10/2019 at 9:00 am; patient aware of arrival time and to also obtain collection devices for stool sample and to also pick up SIBO kit from [facility] office; patient verbalized he could see his instructions in MyChart for flex sig prep; patient advised to call back to the office should questions/concerns arise;  [address] RN will ensure SIBO test kit is available to patient;

## 2019-07-09 ENCOUNTER — Telehealth: Payer: Self-pay | Admitting: Gastroenterology

## 2019-07-09 ENCOUNTER — Other Ambulatory Visit: Payer: Medicare Other

## 2019-07-09 ENCOUNTER — Telehealth: Payer: Self-pay

## 2019-07-09 ENCOUNTER — Telehealth: Payer: Self-pay | Admitting: Allergy and Immunology

## 2019-07-09 ENCOUNTER — Telehealth: Payer: Self-pay | Admitting: *Deleted

## 2019-07-09 NOTE — Telephone Encounter (Signed)
Covid-19 screening questions   Do you now or have you had a fever in the last 14 days?no  Do you have any respiratory symptoms of shortness of breath or cough now or in the last 14 days?no  Do you have any family members or close contacts with diagnosed or suspected Covid-19 in the past 14 days?no  Have you been tested for Covid-19 and found to be positive? No PT made aware of care partner policy.

## 2019-07-09 NOTE — Telephone Encounter (Signed)
Pt reported that famotidine is not working for him.

## 2019-07-09 NOTE — Telephone Encounter (Signed)
Called patient and informed him of Dr. Bruna Potter suggestion. He voiced understanding and would switch back to his omeprazole.

## 2019-07-09 NOTE — Telephone Encounter (Signed)
Brandon Gallagher called in and states the PEPCID that Dr. Neldon Mc put him on 5 days ago is not working.  Brandon Gallagher states he had pizza today and it was a living nightmare.  Brandon Gallagher would like to know if there is anything else that he can take.  Please advise. Brandon Gallagher would like a call back.

## 2019-07-09 NOTE — Telephone Encounter (Signed)
Called and left patient a message. Told him he did not need to bring a stool specimen with him for procedure scheduled on 8/27.

## 2019-07-09 NOTE — Telephone Encounter (Signed)
Pt is scheduled for Flex Sig 8/27 and would like to know whether he can use a stool specimen.

## 2019-07-09 NOTE — Telephone Encounter (Signed)
Pt stated that Dr. Lyndel Safe would like him to have a stool sample.  Pt inquired whether he should use the stool sample or wait until Friday.

## 2019-07-09 NOTE — Telephone Encounter (Signed)
Please inform patient that if switching from his proton pump inhibitor to Pepcid did not help his stomach issue then he can go back to his proton pump inhibitor.

## 2019-07-10 ENCOUNTER — Encounter: Payer: Self-pay | Admitting: Gastroenterology

## 2019-07-10 ENCOUNTER — Ambulatory Visit (AMBULATORY_SURGERY_CENTER): Payer: Medicare Other | Admitting: Gastroenterology

## 2019-07-10 ENCOUNTER — Other Ambulatory Visit: Payer: Self-pay

## 2019-07-10 VITALS — BP 96/56 | HR 68 | Temp 99.8°F | Resp 16 | Ht 71.0 in | Wt 256.0 lb

## 2019-07-10 DIAGNOSIS — K52832 Lymphocytic colitis: Secondary | ICD-10-CM | POA: Diagnosis not present

## 2019-07-10 DIAGNOSIS — R109 Unspecified abdominal pain: Secondary | ICD-10-CM

## 2019-07-10 DIAGNOSIS — K648 Other hemorrhoids: Secondary | ICD-10-CM | POA: Diagnosis not present

## 2019-07-10 DIAGNOSIS — D125 Benign neoplasm of sigmoid colon: Secondary | ICD-10-CM | POA: Diagnosis not present

## 2019-07-10 DIAGNOSIS — R197 Diarrhea, unspecified: Secondary | ICD-10-CM

## 2019-07-10 MED ORDER — SODIUM CHLORIDE 0.9 % IV SOLN: 500.0000 mL | Freq: Once | INTRAVENOUS | Status: DC

## 2019-07-10 NOTE — Op Note (Signed)
[place] Patient Name: Brandon Gallagher Procedure Date: 07/10/2019 8:32 AM MRN: [id number] Endoscopist: Jackquline Denmark , MD Age: 58 Referring MD:  Date of Birth: [birth year] Gender: Male Account #: [id number] Procedure:                Flexible Sigmoidoscopy Indications:              Diarrhea. Negative colonoscopy by Dr. Melina Copa                            previously. Negative CT for etiology. Medicines:                Monitored Anesthesia Care Procedure:                Pre-Anesthesia Assessment:                           - Prior to the procedure, a History and Physical                            was performed, and patient medications and                            allergies were reviewed. The patient's tolerance of                            previous anesthesia was also reviewed. The risks                            and benefits of the procedure and the sedation                            options and risks were discussed with the patient.                            All questions were answered, and informed consent                            was obtained. Prior Anticoagulants: The patient has                            taken no previous anticoagulant or antiplatelet                            agents. ASA Grade Assessment: III - A patient with                            severe systemic disease. After reviewing the risks                            and benefits, the patient was deemed in                            satisfactory condition to undergo the procedure.  After obtaining informed consent, the scope was                            passed under direct vision. The Colonoscope was                            introduced through the anus and advanced to the the                            descending colon. Solid stool was encountered in                            the descending colon and hence scope was not                            advanced further. The  flexible sigmoidoscopy was                            accomplished without difficulty. The patient                            tolerated the procedure well. The quality of the                            bowel preparation was good. Scope In: 8:52:18 AM Scope Out: 9:00:16 AM Total Procedure Duration: 0 hours 7 minutes 58 seconds  Findings:                 Three sessile polyps were found in the proximal                            sigmoid colon and mid sigmoid colon. The polyps                            were 2 to 4 mm in size. These polyps were removed                            with a cold biopsy forceps. Resection and retrieval                            were complete. Estimated blood loss: none.                           Multiple medium-mouthed diverticula were found in                            the sigmoid colon.                           The colon (entire examined portion) appeared                            normal. Biopsies for histology were taken with a  cold forceps for evaluation of microscopic colitis.                            Estimated blood loss: none.                           Non-bleeding internal hemorrhoids were found during                            retroflexion. The hemorrhoids were small.                           The exam was otherwise without abnormality. Complications:            No immediate complications. Estimated Blood Loss:     Estimated blood loss: none. Impression:               -Colonic polyps S/P polypectomy.                           -Moderate sigmoid diverticulosis.                           -Otherwise normal sigmoidoscopy. Recommendation:           - Discharge patient to home.                           - Await pathology results.                           - Resume previous diet.                           - Return to GI clinic in 12 weeks.                           - For his UGI symptoms, he can take omeprazole 20                             mg p.o. every morning and Pepcid 20 mg p.o. nightly                            for now. He has prescriptions. Jackquline Denmark, MD 07/10/2019 9:09:37 AM This report has been signed electronically.

## 2019-07-10 NOTE — Telephone Encounter (Signed)
He is coming in for the procedure today. Will discuss with him.

## 2019-07-10 NOTE — Progress Notes (Signed)
Temperature- [facility] VS-Stephanie Poindexter

## 2019-07-10 NOTE — Telephone Encounter (Signed)
Please advise 

## 2019-07-10 NOTE — Patient Instructions (Signed)
Handouts given for polyps, hemorrhoids and diverticulosis.  Continue taking your Omeprazole 20mg  every morning and Pepcid 20 mg at night.  YOU HAD AN ENDOSCOPIC PROCEDURE TODAY AT [facility]:   Refer to the procedure report that was given to you for any specific questions about what was found during the examination.  If the procedure report does not answer your questions, please call your gastroenterologist to clarify.  If you requested that your care partner not be given the details of your procedure findings, then the procedure report has been included in a sealed envelope for you to review at your convenience later.  YOU SHOULD EXPECT: Some feelings of bloating in the abdomen. Passage of more gas than usual.  Walking can help get rid of the air that was put into your GI tract during the procedure and reduce the bloating. If you had a lower endoscopy (such as a colonoscopy or flexible sigmoidoscopy) you may notice spotting of blood in your stool or on the toilet paper. If you underwent a bowel prep for your procedure, you may not have a normal bowel movement for a few days.  Please Note:  You might notice some irritation and congestion in your nose or some drainage.  This is from the oxygen used during your procedure.  There is no need for concern and it should clear up in a day or so.  SYMPTOMS TO REPORT IMMEDIATELY:   Following lower endoscopy (colonoscopy or flexible sigmoidoscopy):  Excessive amounts of blood in the stool  Significant tenderness or worsening of abdominal pains  Swelling of the abdomen that is new, acute  Fever of 100F or higher  For urgent or emergent issues, a gastroenterologist can be reached at any hour by calling [phone].   DIET:  We do recommend a small meal at first, but then you may proceed to your regular diet.  Drink plenty of fluids but you should avoid alcoholic beverages for 24 hours.  ACTIVITY:  You should plan to take it easy for  the rest of today and you should NOT DRIVE or use heavy machinery until tomorrow (because of the sedation medicines used during the test).    FOLLOW UP: Our staff will call the number listed on your records 48-72 hours following your procedure to check on you and address any questions or concerns that you may have regarding the information given to you following your procedure. If we do not reach you, we will leave a message.  We will attempt to reach you two times.  During this call, we will ask if you have developed any symptoms of COVID 19. If you develop any symptoms (ie: fever, flu-like symptoms, shortness of breath, cough etc.) before then, please call [phone].  If you test positive for Covid 19 in the 2 weeks post procedure, please call and report this information to [place].    If any biopsies were taken you will be contacted by phone or by letter within the next 1-3 weeks.  Please call us at [phone] if you have not heard about the biopsies in 3 weeks.    SIGNATURES/CONFIDENTIALITY: You and/or your care partner have signed paperwork which will be entered into your electronic medical record.  These signatures attest to the fact that that the information above on your After Visit Summary has been reviewed and is understood.  Full responsibility of the confidentiality of this discharge information lies with you and/or your care-partner.

## 2019-07-10 NOTE — Progress Notes (Signed)
PT taken to PACU. Monitors in place. VSS. Report given to RN. 

## 2019-07-12 LAB — STREP PNEUMONIAE 23 SEROTYPES IGG
Pneumo Ab Type 1*: <0.1 ug/mL — ABNORMAL LOW (ref >1.3)
Pneumo Ab Type 12 (12F)*: <0.1 ug/mL — ABNORMAL LOW (ref >1.3)
Pneumo Ab Type 14*: <0.1 ug/mL — ABNORMAL LOW (ref >1.3)
Pneumo Ab Type 17 (17F)*: <0.1 ug/mL — ABNORMAL LOW (ref >1.3)
Pneumo Ab Type 19 (19F)*: 0.2 ug/mL — ABNORMAL LOW (ref >1.3)
Pneumo Ab Type 2*: <0.1 ug/mL — ABNORMAL LOW (ref >1.3)
Pneumo Ab Type 20*: <0.1 ug/mL — ABNORMAL LOW (ref >1.3)
Pneumo Ab Type 22 (22F)*: <0.1 ug/mL — ABNORMAL LOW (ref >1.3)
Pneumo Ab Type 23 (23F)*: <0.1 ug/mL — ABNORMAL LOW (ref >1.3)
Pneumo Ab Type 26 (6B)*: <0.1 ug/mL — ABNORMAL LOW (ref >1.3)
Pneumo Ab Type 3*: 0.8 ug/mL — ABNORMAL LOW (ref >1.3)
Pneumo Ab Type 34 (10A)*: <0.1 ug/mL — ABNORMAL LOW (ref >1.3)
Pneumo Ab Type 4*: <0.1 ug/mL — ABNORMAL LOW (ref >1.3)
Pneumo Ab Type 43 (11A)*: <0.1 ug/mL — ABNORMAL LOW (ref >1.3)
Pneumo Ab Type 5*: <0.1 ug/mL — ABNORMAL LOW (ref >1.3)
Pneumo Ab Type 51 (7F)*: <0.1 ug/mL — ABNORMAL LOW (ref >1.3)
Pneumo Ab Type 54 (15B)*: <0.1 ug/mL — ABNORMAL LOW (ref >1.3)
Pneumo Ab Type 56 (18C)*: <0.1 ug/mL — ABNORMAL LOW (ref >1.3)
Pneumo Ab Type 57 (19A)*: 0.4 ug/mL — ABNORMAL LOW (ref >1.3)
Pneumo Ab Type 68 (9V)*: 0.1 ug/mL — ABNORMAL LOW (ref >1.3)
Pneumo Ab Type 70 (33F)*: 0.3 ug/mL — ABNORMAL LOW (ref >1.3)
Pneumo Ab Type 8*: 0.2 ug/mL — ABNORMAL LOW (ref >1.3)
Pneumo Ab Type 9 (9N)*: <0.1 ug/mL — ABNORMAL LOW (ref >1.3)

## 2019-07-12 LAB — IGG, IGA, IGM
IgA/Immunoglobulin A, Serum: <5 mg/dL — ABNORMAL LOW (ref 90–386)
IgG (Immunoglobin G), Serum: 755 mg/dL (ref 603–1613)
IgM (Immunoglobulin M), Srm: 46 mg/dL (ref 20–172)

## 2019-07-12 LAB — IGE: IgE (Immunoglobulin E), Serum: <2 IU/mL — ABNORMAL LOW (ref 6–495)

## 2019-07-12 LAB — DIPHTHERIA / TETANUS ANTIBODY PANEL
Diphtheria Ab: 0.27 IU/mL (ref <0.10)
Tetanus Ab, IgG: 1.61 IU/mL (ref <0.10)

## 2019-07-14 ENCOUNTER — Telehealth: Payer: Self-pay | Admitting: Gastroenterology

## 2019-07-14 NOTE — Telephone Encounter (Signed)
  Follow up Call-  Call back number 07/10/2019 06/24/2019  Post procedure Call Back phone  # [phone]  Permission to leave phone message Yes Yes  Some recent data might be hidden     Patient questions:  Do you have a fever, pain , or abdominal swelling? yes Pain Score  4  Have you tolerated food without any problems? Yes.    Have you been able to return to your normal activities? Yes.    Do you have any questions about your discharge instructions: Diet   No. Medications  No. Follow up visit  No.  Do you have questions or concerns about your Care? No.  Actions: * If pain score is 4 or above: No action needed, pain <4.  Pt states he has his normal pain he has been having for 6 months.  1. Have you developed a fever since your procedure? no  2.   Have you had an respiratory symptoms (SOB or cough) since your procedure? no  3.   Have you tested positive for COVID 19 since your procedure no  4.   Have you had any family members/close contacts diagnosed with the COVID 19 since your procedure?  no   If yes to any of these questions please route to Joylene John, RN and Alphonsa Gin, Therapist, sports.

## 2019-07-14 NOTE — Telephone Encounter (Signed)
SIBO test order faxed to [facility] at this time;

## 2019-07-14 NOTE — Telephone Encounter (Signed)
Patient is upset that he had to come to the [place] office to pick up the lab specimens.  I apologized for the inconvenience.

## 2019-07-15 NOTE — Telephone Encounter (Signed)
Called and spoke with patient-patient reports he is having tiny, hard pieces of stool-is "wondering if this is considered diarrhea?" patient advised to make sure he is consuming enough fluids and to make sure he is not eating fried, greasy foods;    Patient wants Dr. Lyndel Safe to let him know if the abdominal cramping-steady nagging pain is going to go away or is there any other options for pain relief or reason for this to still be going on? Please advise

## 2019-07-16 ENCOUNTER — Encounter: Payer: Self-pay | Admitting: Gastroenterology

## 2019-07-16 DIAGNOSIS — Z23 Encounter for immunization: Secondary | ICD-10-CM | POA: Diagnosis not present

## 2019-07-16 NOTE — Telephone Encounter (Signed)
Hard stools as well as constipation. He should get better over time.  He is already on pain medications. Can try heating pads at night for now.  RG

## 2019-07-17 ENCOUNTER — Telehealth: Payer: Self-pay | Admitting: Gastroenterology

## 2019-07-17 NOTE — Telephone Encounter (Signed)
Please see additional documentation concerning this patient 

## 2019-07-17 NOTE — Telephone Encounter (Signed)
Called and spoke with patient-patient advised of MD recommendations and is agreeable with plan of care; patient advised to call back to the office should questions/concerns arise; patient verbalized understanding of information/instructions;   Patient information and order for SIBO test have been faxed in (on 07/14/2019), however, patient reports he has not been contacted by [facility]-patient informed to call [phone] or the number on the inside of the box to speak with that company;

## 2019-07-25 DIAGNOSIS — K529 Noninfective gastroenteritis and colitis, unspecified: Secondary | ICD-10-CM | POA: Diagnosis not present

## 2019-07-26 ENCOUNTER — Telehealth: Payer: Self-pay | Admitting: Nurse Practitioner

## 2019-07-26 NOTE — Telephone Encounter (Signed)
Patient called to ask about biopsy results, still having lower abdominal pain. Pain diffuse across lower abdomen. Dicyclomine doesn't help. Gluten free diet didn't help. He sometimes has loose stool, other times hard balls. It appears patient never got Rx for Budesonide, says he doesn't know anything about it.    Patient will call his pharmacy. If Budesonide Rx not there then he will call me back and I will call in script. However, this probably won't help his pain. PCP gave him hydrocodone but apparently that doesn't help.

## 2019-07-26 NOTE — Telephone Encounter (Signed)
Patient called back. [facility] never received Rx for Budesonide. I called in  Budesonide 9mg  po qd x 8 weeks, then if better, 6mg  for 2 weeks followed by 3 mg for another 2 weeks.

## 2019-07-28 ENCOUNTER — Ambulatory Visit: Payer: Medicare Other | Admitting: Gastroenterology

## 2019-07-28 ENCOUNTER — Telehealth: Payer: Self-pay | Admitting: Gastroenterology

## 2019-07-28 NOTE — Telephone Encounter (Signed)
Pt stated that Dr. Lyndel Safe had ordered stool tests to be done, but pt has not had much diarrhea.  He would like to know whether stool tests are still necessary.

## 2019-07-28 NOTE — Telephone Encounter (Signed)
Please advise RN concerning previous message from patient-

## 2019-07-28 NOTE — Telephone Encounter (Signed)
Lets hold off on stool testing since the diarrhea has resolved. If it happens again, he would need to get stool tested. RG

## 2019-07-29 NOTE — Telephone Encounter (Signed)
Called and spoke with patient-patient informed of MD recommendations; patient is agreeable with plan of care and will submit a stool sample if he starts having diarrhea (for a few days); Patient verbalized understanding of information/instructions;  Patient was advised to call the office at [phone] if questions/concerns arise;

## 2019-07-31 ENCOUNTER — Ambulatory Visit (INDEPENDENT_AMBULATORY_CARE_PROVIDER_SITE_OTHER): Payer: Medicare Other | Admitting: Allergy and Immunology

## 2019-07-31 ENCOUNTER — Other Ambulatory Visit: Payer: Self-pay

## 2019-07-31 ENCOUNTER — Encounter: Payer: Self-pay | Admitting: Allergy and Immunology

## 2019-07-31 ENCOUNTER — Telehealth: Payer: Self-pay | Admitting: Gastroenterology

## 2019-07-31 VITALS — BP 126/82 | HR 84 | Temp 97.8°F | Resp 16

## 2019-07-31 DIAGNOSIS — J4540 Moderate persistent asthma, uncomplicated: Secondary | ICD-10-CM | POA: Diagnosis not present

## 2019-07-31 DIAGNOSIS — D801 Nonfamilial hypogammaglobulinemia: Secondary | ICD-10-CM | POA: Diagnosis not present

## 2019-07-31 DIAGNOSIS — J324 Chronic pansinusitis: Secondary | ICD-10-CM

## 2019-07-31 DIAGNOSIS — B999 Unspecified infectious disease: Secondary | ICD-10-CM | POA: Diagnosis not present

## 2019-07-31 NOTE — Patient Instructions (Addendum)
  1.  Continue to treat and prevent inflammation:   A.  OTC Nasacort-1 spray each nostril 1 time per day  B.  Symbicort 160- 2 inhalations 1-2  times per day     C.  Montelukast 10 mg - 1 tablet 1 time per day  2.  If needed:   A.  Nasal saline  B.  Albuterol HFA-2 inhalations every 4-6 hours  3.  Repeat pneumo antibodies in 4 - 6 weeks after Pneumovax   4.  Immunoglobulin infusions?  5.  Obtain full flu vaccine (and COVID vaccine)  6.  Return to clinic in December 2020 or earlier if problem

## 2019-07-31 NOTE — Progress Notes (Signed)
[place]   Follow-up Note  Referring Provider: Nicoletta Dress, MD Primary Provider: Nicoletta Dress, MD Date of Office Visit: 07/31/2019  Subjective:   Brandon Gallagher (DOB: November 09, 1961) is a 58 y.o. male who returns to the [facility] on 07/31/2019 in re-evaluation of the following:  HPI: Lekendrick returns to this clinic in reevaluation of hypogammaglobulinemia and history of recurrent infections and inflammation of his airway and a chronic GI pain issue.  I last saw him in this clinic during his initial evaluation of 03 July 2019.  During his last evaluation we had him consistently use preventative anti-inflammatory medications for both his upper and lower airway and he has done relatively well regarding his airway.  He has no significant upper or lower airway symptoms and does not use a short acting bronchodilator where he continues on a combination inhaler and nasal steroid and montelukast.  He has continued to have very significant GI issues and underwent an upper endoscopy and lower endoscopy which apparently identified some degree of inflammation and he has been started on oral budesonide for the past 5 days.  He has received his Pneumovax approximately 2 weeks ago at his primary care doctor's office.  Allergies as of 07/31/2019      Reactions   Iodinated Diagnostic Agents    Other reaction(s): NO ALLERGY   Latex    Other reaction(s): NO ALLERGY      Medication List      ALBUTEROL IN Inhale 2 puffs into the lungs 2 (two) times daily as needed.   ALPRAZolam 1 MG tablet Commonly known as: XANAX Take 1 mg by mouth 2 (two) times daily as needed for anxiety.   ammonium lactate 12 % lotion Commonly known as: LAC-HYDRIN 1 application daily.   budesonide 3 MG 24 hr capsule Commonly known as: ENTOCORT EC TAKE 3 CAPSULES EVERY MORNING FOR 8 WEEKS, THEN TAKE 2 CAPSULES EVERY MORNING FOR 2 WEEKS, THEN TAKE  ONE CAPSULE EVERY MORNING FOR 2 WEEKS   HYDROCODONE BITARTRATE PO Take by mouth as needed.   montelukast 10 MG tablet Commonly known as: SINGULAIR Take 1 tablet (10 mg total) by mouth at bedtime.   omeprazole 20 MG capsule Commonly known as: PRILOSEC Take 20 mg by mouth 2 (two) times daily.   Symbicort 160-4.5 MCG/ACT inhaler Generic drug: budesonide-formoterol Inhale 2 puffs into the lungs 1-2 times daily       Past Medical History:  Diagnosis Date  . Allergy   . Anxiety   . Arthritis   . Asthma   . Blood transfusion without reported diagnosis   . Bronchitis    last had back in 2017   Sees no pulmonary md  . COPD (chronic obstructive pulmonary disease) ([place])   . Depression   . Diverticulosis    last flare up was in Jan. 2018  Was on course of antibiotics for this  . Esophagus disorder    narrowing of esophagus.  Had to have dilatation 09/2016  . Family history of adverse reaction to anesthesia    son had severe N/V with his knee surgery  . GERD (gastroesophageal reflux disease)   . Headache   . History of Clostridioides difficile colitis   . History of colon polyps   . IBS (irritable bowel syndrome)   . Sleep apnea    no CPAP- unsure if true diagnosis or not    Past Surgical History:  Procedure  Laterality Date  . ANTERIOR CERVICAL DECOMP/DISCECTOMY FUSION N/A 01/01/2017   Procedure: Cervical four- five and Cervical six- seven Anterior cervical discectomy with fusion and plate fixation;  Surgeon: Kevan Ny Ditty, MD;  Location: [place];  Service: Neurosurgery;  Laterality: N/A;  C4-5 and C6-7 Anterior cervical discectomy with fusion and plate fixation  . BACK SURGERY     x 2    @ [facility]        Last one 09/2015  . CARDIAC CATHETERIZATION     He thinks.   Not really sure where at, thought it was here @ [facility], but its been a LONG time ?2000  . COLONOSCOPY  10/22/2017   Colonic polyp status post polypectomy. Moderate predominantly sigmoid diverticulosis.   .  COLONOSCOPY  2019   Dr Orlena Sheldon  . ESOPHAGOGASTRODUODENOSCOPY  06/08/2016   Distal esophageal stricture status post esophageal dilatation. Presbtesophagus, also suggestive of motility disorder. Mild gastritis.   Marland Kitchen NASAL SINUS SURGERY     HAS HAD 2 OR 3.   LAST ONE 2011, IN [place]  (LEFT NASAL PASSAGE IS NARROWER THAN RIGHT  . UPPER GASTROINTESTINAL ENDOSCOPY      Review of systems negative except as noted in HPI / PMHx or noted below:  Review of Systems  Constitutional: Negative.   HENT: Negative.   Eyes: Negative.   Respiratory: Negative.   Cardiovascular: Negative.   Gastrointestinal: Negative.   Genitourinary: Negative.   Musculoskeletal: Negative.   Skin: Negative.   Neurological: Negative.   Endo/Heme/Allergies: Negative.   Psychiatric/Behavioral: Negative.      Objective:   Vitals:   07/31/19 0852  BP: 126/82  Pulse: 84  Resp: 16  Temp: 97.8 F (36.6 C)  SpO2: 96%          Physical Exam Constitutional:      Appearance: He is not diaphoretic.  HENT:     Head: Normocephalic.     Right Ear: Tympanic membrane, ear canal and external ear normal.     Left Ear: Tympanic membrane, ear canal and external ear normal.     Nose: Nose normal. No mucosal edema or rhinorrhea.     Mouth/Throat:     Pharynx: Uvula midline. No oropharyngeal exudate.  Eyes:     Conjunctiva/sclera: Conjunctivae normal.  Neck:     Thyroid: No thyromegaly.     Trachea: Trachea normal. No tracheal tenderness or tracheal deviation.  Cardiovascular:     Rate and Rhythm: Normal rate and regular rhythm.     Heart sounds: Normal heart sounds, S1 normal and S2 normal. No murmur.  Pulmonary:     Effort: No respiratory distress.     Breath sounds: Normal breath sounds. No stridor. No wheezing or rales.  Lymphadenopathy:     Head:     Right side of head: No tonsillar adenopathy.     Left side of head: No tonsillar adenopathy.     Cervical: No cervical adenopathy.  Skin:    Findings: No  erythema or rash.     Nails: There is no clubbing.   Neurological:     Mental Status: He is alert.     Diagnostics:    Spirometry was performed and demonstrated an FEV1 of 3.20 at 84 % of predicted.  Results of blood tests obtained 03 July 2019 identified IgG 755 mg/DL, IgA less than 5 mg/DL, IgM 46 mg/DL, IgE less than 2 KU/L, no antibodies directed against multiple serotypes of pneumococcus, tetanus IgG 1.61 KU/L  Assessment and  Plan:   1. Hypogammaglobulinemia ([place])   2. Recurrent infections   3. Asthma, moderate persistent, well-controlled   4. Chronic pansinusitis     1.  Continue to treat and prevent inflammation:   A.  OTC Nasacort-1 spray each nostril 1 time per day  B.  Symbicort 160- 2 inhalations 1-2  times per day     C.  Montelukast 10 mg - 1 tablet 1 time per day  2.  If needed:   A.  Nasal saline  B.  Albuterol HFA-2 inhalations every 4-6 hours  3.  Repeat pneumo antibodies in 4 - 6 weeks after Pneumovax   4.  Immunoglobulin infusions?  5.  Obtain full flu vaccine (and COVID vaccine)  6.  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  He has absolutely no high titer antibodies directed against pneumococcus.  Based upon his response to the Pneumovax which was administered by his primary care doctor about 2 weeks ago we will make a determination about whether or not he qualifies for immunoglobulin infusion.  He will remain on anti-inflammatory agents for his airway in a preventative manner as noted above.  I will see him back in this clinic in December 2020 or earlier if there is a problem.  Allena Katz, MD Allergy / Immunology [place]

## 2019-08-01 NOTE — Telephone Encounter (Signed)
Please see previous message and advise-patient is currently scheduled to see Dr. Lyndel Safe on 08/07/2019

## 2019-08-01 NOTE — Telephone Encounter (Signed)
Stop budesonide since he is having constipation. Lets have dietary consultation He has follow-up appointment with me Will discuss more  Thx  RG

## 2019-08-01 NOTE — Telephone Encounter (Signed)
Called and spoke with patient-patient informed of MD recommendations; patient is agreeable with plan of care;  Patient verbalized understanding of information/instructions;  Patient was advised to call the office at [phone] if questions/concerns arise; 

## 2019-08-04 ENCOUNTER — Telehealth: Payer: Self-pay | Admitting: Gastroenterology

## 2019-08-04 NOTE — Telephone Encounter (Signed)
Pt would like some advise for abd pain, he states that he was not able to sleep this past weekend due to the pain that he had.

## 2019-08-05 NOTE — Telephone Encounter (Signed)
Called and spoke with patient-patient is wanting to know if there is anything OTC that can help with the abdominal pain until he is seen in the office on 08/07/2019? Patient has been seen by the dietician-"not really given helpful information"-been following the dietician recommendations "as best as possible"-still having the "burning when he swallows" -has been taking the (added by Dr. Neldon Mc) Pepcid in the am and Prilosec at night-when the acid reflux is really bad  Please advise

## 2019-08-06 NOTE — Telephone Encounter (Signed)
He has appointment coming up tomorrow We will discuss more with him  RG

## 2019-08-07 ENCOUNTER — Encounter: Payer: Self-pay | Admitting: Gastroenterology

## 2019-08-07 ENCOUNTER — Other Ambulatory Visit: Payer: Self-pay

## 2019-08-07 ENCOUNTER — Ambulatory Visit (INDEPENDENT_AMBULATORY_CARE_PROVIDER_SITE_OTHER): Payer: Medicare Other | Admitting: Gastroenterology

## 2019-08-07 VITALS — BP 106/74 | HR 110 | Temp 98.1°F | Ht 71.0 in | Wt 256.0 lb

## 2019-08-07 DIAGNOSIS — Z86010 Personal history of colonic polyps: Secondary | ICD-10-CM | POA: Diagnosis not present

## 2019-08-07 DIAGNOSIS — K219 Gastro-esophageal reflux disease without esophagitis: Secondary | ICD-10-CM

## 2019-08-07 DIAGNOSIS — R197 Diarrhea, unspecified: Secondary | ICD-10-CM

## 2019-08-07 DIAGNOSIS — M47816 Spondylosis without myelopathy or radiculopathy, lumbar region: Secondary | ICD-10-CM | POA: Diagnosis not present

## 2019-08-07 DIAGNOSIS — M4316 Spondylolisthesis, lumbar region: Secondary | ICD-10-CM | POA: Diagnosis not present

## 2019-08-07 NOTE — Patient Instructions (Signed)
If you are age 59 or older, your body mass index should be between 23-30. Your Body mass index is 35.7 kg/m. If this is out of the aforementioned range listed, please consider follow up with your Primary Care Provider.  If you are age 72 or younger, your body mass index should be between 19-25. Your Body mass index is 35.7 kg/m. If this is out of the aformentioned range listed, please consider follow up with your Primary Care Provider.   To help prevent the possible spread of infection to our patients, communities, and staff; we will be implementing the following measures:  As of now we are not allowing any visitors/family members to accompany you to any upcoming appointments with [facility]. If you have any concerns about this please contact our office to discuss prior to the appointment.   Please call Dr. Leland Her nurse Faythe Casa, RN)  in 2 weeks at [phone]  to let her now how you are doing.   Thank you,  Dr. Jackquline Denmark

## 2019-08-07 NOTE — Progress Notes (Signed)
Chief Complaint: FU  Referring Provider:  Nicoletta Dress, MD      ASSESSMENT AND PLAN;   #1.  Chronic lower abdominal pain. Neg colon 02/2018 (Dr Melina Copa), neg CT AP 05/2019  #2.  IBS with predominant constipation. Occ diarrhea.  Random colonic biopsies previously did not demonstrate lymphocytic colitis.  Currently with constipation.  #3. GERD with dysphagia d/t distal esophageal stricture/presbyesophagus s/p dil 06/2019. H/O food impaction S/P endoscopic disimpaction 04/2016. Eso Bx- neg for EoE. SB Bx neg for celiac  #4. H/O colonic polyps. Next colon due 02/2021.  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  I have discussed extensively with Becky -I would recommend MRI back if Dr. Delena Bali thinks it is appropriate. -He already had extensive GI work-up performed.  At the present time I would hold off on any work-up. -if bloating becomes a significant problem, H2 breath test to rule out SIBO. -Follow-up after the above work-up is complete.    Addendum: Colonoscopy report from Dr. Melina Copa 02/20/2018-4 small polyps measuring 4 to 6 mm s/p  Polypectomy. Bx- TAs.  Repeat in 3 years. CT 05/14/2019: Diverticulosis without diverticulitis, mild hepatic steatosis.  Otherwise normal. Reports sent for scanning. HPI:    Brandon Gallagher is a 58 y.o. male  4 months x diarrhea (1- 1.5 days, 3-4 loose BMs/day) and then constipation with associated lower abdominal pain which gets better with defecation.  Some associated abdominal bloating. Seen by Dr. Melina Copa.  Underwent CT scan Abdo/pelvis at [facility] which was negative last month.  Had colonoscopy by Dr. Melina Copa in 2019-negative per patient.  Records are awaited. Blood tests, stool studies were negative.  They did not do C. difficile and stool was solid. Dicyclomine-helps a little.  Equalactin did not.  He also  had physical exam by Dr. Delena Bali recently and had blood tests performed which were negative.  Apparently had normal TSH.  Also has been having intermittent heartburn with occasional dysphagia to solids.  Denies having any fever chills or night sweats.  No melena or hematochezia.  No weight loss.  No nonsteroidals.  No sodas.  No chocolates mints.  SH-wife Becky works with Dr. Delena Bali  Past GI procedures: -Colonoscopy 10/22/2017 ([facility])-colonic polyps (17) status post polypectomy (Bx- TA), moderate predominantly sigmoid diverticulosis.  Random colonic biopsies-mild lymphocytic colitis.  Refused genetic test. Colonoscopy 2019 (Dr. Hedy Jacob report. -EGD8/2020-distal esophageal stricture S/P dilatation, negative small bowel biopsies, gastric polyps-benign.  06/08/2016-distal esophageal stricture s/p esophageal dilatation (54Fr), presbyesophagus, mild gastritis. Bx- neg for EoE, neg SB Bx. H/O food impaction status post endoscopic disimpaction 04/15/2016 Past Medical History:  Diagnosis Date  . Allergy   . Anxiety   . Arthritis   . Asthma   . Blood transfusion without reported diagnosis   . Bronchitis    last had back in 2017   Sees no pulmonary md  . COPD (chronic obstructive pulmonary disease) ([place])   . Depression   . Diverticulosis    last flare up was in Jan. 2018  Was on course of antibiotics for this  . Esophagus disorder    narrowing of esophagus.  Had to have dilatation 09/2016  . Family history of adverse reaction to anesthesia    son had severe N/V with his knee surgery  . GERD (gastroesophageal reflux disease)   . Headache   . History  of Clostridioides difficile colitis   . History of colon polyps   . IBS (irritable bowel syndrome)   . Sleep apnea    no CPAP- unsure if true diagnosis or not    Past Surgical History:  Procedure Laterality Date  . ANTERIOR CERVICAL DECOMP/DISCECTOMY FUSION N/A 01/01/2017   Procedure: Cervical four- five and Cervical six- seven Anterior  cervical discectomy with fusion and plate fixation;  Surgeon: Kevan Ny Ditty, MD;  Location: [place];  Service: Neurosurgery;  Laterality: N/A;  C4-5 and C6-7 Anterior cervical discectomy with fusion and plate fixation  . BACK SURGERY     x 2    @ [facility]        Last one 09/2015  . CARDIAC CATHETERIZATION     He thinks.   Not really sure where at, thought it was here @ [facility], but its been a LONG time ?2000  . COLONOSCOPY  10/22/2017   Colonic polyp status post polypectomy. Moderate predominantly sigmoid diverticulosis.   . COLONOSCOPY  2019   Dr Orlena Sheldon  . ESOPHAGOGASTRODUODENOSCOPY  06/08/2016   Distal esophageal stricture status post esophageal dilatation. Presbtesophagus, also suggestive of motility disorder. Mild gastritis.   Marland Kitchen NASAL SINUS SURGERY     HAS HAD 2 OR 3.   LAST ONE 2011, IN [place]  (LEFT NASAL PASSAGE IS NARROWER THAN RIGHT  . UPPER GASTROINTESTINAL ENDOSCOPY      Family History  Problem Relation Age of Onset  . Heart disease Mother   . Stroke Father   . Colon cancer Neg Hx   . Esophageal cancer Neg Hx   . Stomach cancer Neg Hx   . Diabetes Neg Hx   . Rectal cancer Neg Hx     Social History   Tobacco Use  . Smoking status: Former Smoker    Packs/day: 1.00    Years: 30.00    Pack years: 30.00    Types: Cigarettes    Quit date: 05/12/2000    Years since quitting: 19.2  . Smokeless tobacco: Never Used  Substance Use Topics  . Alcohol use: No    Alcohol/week: 0.0 standard drinks  . Drug use: Not Currently    Current Outpatient Medications  Medication Sig Dispense Refill  . ALBUTEROL IN Inhale 2 puffs into the lungs 2 (two) times daily as needed.    . ALPRAZolam (XANAX) 1 MG tablet Take 1 mg by mouth 2 (two) times daily as needed for anxiety.     Marland Kitchen ammonium lactate (LAC-HYDRIN) 12 % lotion 1 application daily.    . famotidine (PEPCID) 20 MG tablet Take 20 mg by mouth daily.    . montelukast (SINGULAIR) 10 MG tablet Take 1 tablet (10 mg total) by  mouth at bedtime. 30 tablet 5  . omeprazole (PRILOSEC) 20 MG capsule Take 20 mg by mouth 2 (two) times daily.    . SYMBICORT 160-4.5 MCG/ACT inhaler Inhale 2 puffs into the lungs 1-2 times daily 1 Inhaler 5   No current facility-administered medications for this visit.     Allergies  Allergen Reactions  . Iodinated Diagnostic Agents     Other reaction(s): NO ALLERGY  . Latex     Other reaction(s): NO ALLERGY    Review of Systems:  Constitutional: Denies fever, chills, diaphoresis, appetite change and fatigue.  HEENT: Has multiple allergies.Marland Kitchen   Respiratory: Denies SOB, DOE, cough, chest tightness,  and wheezing.   Cardiovascular: Denies chest pain, palpitations and leg swelling.  Genitourinary: Denies dysuria, urgency, frequency,  hematuria, flank pain and difficulty urinating.  Musculoskeletal: Has myalgias, back pain, joint swelling, arthralgias and gait problem.  Skin: No rash.  Neurological: Denies dizziness, seizures, syncope, weakness, light-headedness, numbness and headaches.  Hematological: Denies adenopathy. Easy bruising, personal or family bleeding history  Psychiatric/Behavioral: Has anxiety or depression, insomnia.     Physical Exam:    BP 106/74   Pulse (!) 110   Temp 98.1 F (36.7 C)   Ht 5\' 11"  (1.803 m)   Wt 256 lb (116.1 kg)   SpO2 97%   BMI 35.70 kg/m  Filed Weights   08/07/19 0942  Weight: 256 lb (116.1 kg)   Constitutional:  Well-developed, in no acute distress. Psychiatric: Normal mood and affect. Behavior is normal. HEENT: Not examined, wearing mask. Neck supple.  Cardiovascular: Normal rate, regular rhythm. No edema Pulmonary/chest: Effort normal and breath sounds normal. No wheezing, rales or rhonchi. Abdominal: Soft, nondistended. Nontender. Bowel sounds active throughout. There are no masses palpable. No hepatomegaly. Rectal:  defered Neurological: Alert and oriented to person place and time. Skin: Skin is warm and dry. No rashes noted.   Multiple bruises.  Data Reviewed: I have personally reviewed following labs and imaging studies  CBC: CBC Latest Ref Rng & Units 12/28/2016  WBC 4.0 - 10.5 K/uL 7.8  Hemoglobin 13.0 - 17.0 g/dL 16.7  Hematocrit 39.0 - 52.0 % 49.6  Platelets 150 - 400 K/uL 224    CMP: CMP Latest Ref Rng & Units 12/28/2016  Glucose 65 - 99 mg/dL 85  BUN 6 - 20 mg/dL 8  Creatinine 0.61 - 1.24 mg/dL 0.93  Sodium 135 - 145 mmol/L 140  Potassium 3.5 - 5.1 mmol/L 3.8  Chloride 101 - 111 mmol/L 105  CO2 22 - 32 mmol/L 25  Calcium 8.9 - 10.3 mg/dL 9.8  25 minutes spent with the patient today. Greater than 50% was spent in counseling and coordination of care with the patient     Carmell Austria, MD 08/07/2019, 10:18 AM  Cc: Nicoletta Dress, MD

## 2019-08-08 DIAGNOSIS — M47816 Spondylosis without myelopathy or radiculopathy, lumbar region: Secondary | ICD-10-CM | POA: Diagnosis not present

## 2019-08-08 DIAGNOSIS — M545 Low back pain: Secondary | ICD-10-CM | POA: Diagnosis not present

## 2019-08-12 ENCOUNTER — Telehealth: Payer: Self-pay | Admitting: Gastroenterology

## 2019-08-12 NOTE — Telephone Encounter (Signed)
If Dr. Delena Bali can arrange for pain consultation. Patient's wife Jacqlyn Larsen works for Dr. Delena Bali. Also if he is interested, lets get another GI opinion from tertiary care center like [facility] (I would prefer that, 2 minds are always better than 1).  If yes, please arrange.  Thx  RG

## 2019-08-12 NOTE — Telephone Encounter (Signed)
Pt states Dr. Lyndel Safe wanted pt to have MRI done of back to see if the pain in his lower stomach may be caused by issues with his back. States he had the MRI done and nothing had changed since his surgery. Wants to know what Dr. Lyndel Safe recommends now. Please advise.

## 2019-08-13 ENCOUNTER — Telehealth: Payer: Self-pay | Admitting: Gastroenterology

## 2019-08-13 NOTE — Telephone Encounter (Signed)
Pt would like to know if he needs to continue taking both meds omeprazole and famotidine. Pls call him .

## 2019-08-13 NOTE — Telephone Encounter (Signed)
Please advise 

## 2019-08-14 NOTE — Telephone Encounter (Signed)
He is specifically more hepatobiliary Any 1st available They are all very good.  RG

## 2019-08-14 NOTE — Telephone Encounter (Signed)
Referral sent to [facility] -awaiting response of appt date/time;

## 2019-08-14 NOTE — Telephone Encounter (Signed)
Forwarded information to Dr. Ardelle Anton called and spoke with Apolonio Schneiders  -informed of need for pain consult-order faxed to [phone] as given by Apolonio Schneiders to be faxed-  Please advise if there is a specific provider this patient needs to be referred to within the [facility] system-?Dr. Jerene Pitch?

## 2019-08-14 NOTE — Telephone Encounter (Signed)
He can Omeprazole in the morning and famotidine at bedtime for now Thx RG

## 2019-08-15 ENCOUNTER — Telehealth: Payer: Self-pay | Admitting: Gastroenterology

## 2019-08-15 NOTE — Telephone Encounter (Signed)
See note below and advise. 

## 2019-08-15 NOTE — Telephone Encounter (Signed)
I have called and instructed patient, patient voiced understanding.

## 2019-08-15 NOTE — Telephone Encounter (Signed)
November is fine. They are all excellent. RG

## 2019-08-15 NOTE — Telephone Encounter (Signed)
Pt would like to know whether there are any MDs Dr. Lyndel Safe can recommend for a second opinion.  He stated that [facility] cannot see him until November.

## 2019-08-18 ENCOUNTER — Telehealth: Payer: Self-pay | Admitting: Gastroenterology

## 2019-08-18 NOTE — Telephone Encounter (Signed)
Per additional documentation concerning this patient-Pt called to inform that [facility] was able to get him in for an appt on 08/29/19.

## 2019-08-18 NOTE — Telephone Encounter (Signed)
Pt called to inform that [facility] was able to get him in for an appt on 08/29/19-please see additional documentation concerning this patient-

## 2019-08-18 NOTE — Telephone Encounter (Signed)
Pt called to inform that [facility] was able to get him in for an appt on 08/29/19.

## 2019-08-22 DIAGNOSIS — Z23 Encounter for immunization: Secondary | ICD-10-CM | POA: Diagnosis not present

## 2019-08-22 LAB — STREP PNEUMONIAE 23 SEROTYPES IGG
Pneumo Ab Type 1*: 8.1 ug/mL (ref >1.3)
Pneumo Ab Type 12 (12F)*: 1.5 ug/mL (ref >1.3)
Pneumo Ab Type 14*: 3.2 ug/mL (ref >1.3)
Pneumo Ab Type 17 (17F)*: 6.1 ug/mL (ref >1.3)
Pneumo Ab Type 19 (19F)*: 7.5 ug/mL (ref >1.3)
Pneumo Ab Type 2*: 11.5 ug/mL (ref >1.3)
Pneumo Ab Type 20*: 14.4 ug/mL (ref >1.3)
Pneumo Ab Type 22 (22F)*: 4.2 ug/mL (ref >1.3)
Pneumo Ab Type 23 (23F)*: 1.2 ug/mL — ABNORMAL LOW (ref >1.3)
Pneumo Ab Type 26 (6B)*: 3.5 ug/mL (ref >1.3)
Pneumo Ab Type 3*: 24.3 ug/mL (ref >1.3)
Pneumo Ab Type 34 (10A)*: 14.4 ug/mL (ref >1.3)
Pneumo Ab Type 4*: 3.2 ug/mL (ref >1.3)
Pneumo Ab Type 43 (11A)*: 4.2 ug/mL (ref >1.3)
Pneumo Ab Type 5*: 69.2 ug/mL (ref >1.3)
Pneumo Ab Type 51 (7F)*: 2.5 ug/mL (ref >1.3)
Pneumo Ab Type 54 (15B)*: 5.4 ug/mL (ref >1.3)
Pneumo Ab Type 56 (18C)*: 3.3 ug/mL (ref >1.3)
Pneumo Ab Type 57 (19A)*: 12.8 ug/mL (ref >1.3)
Pneumo Ab Type 68 (9V)*: >21.2 ug/mL (ref >1.3)
Pneumo Ab Type 70 (33F)*: 12.9 ug/mL (ref >1.3)
Pneumo Ab Type 8*: >25.3 ug/mL (ref >1.3)
Pneumo Ab Type 9 (9N)*: 7.6 ug/mL (ref >1.3)

## 2019-08-24 ENCOUNTER — Telehealth: Payer: Self-pay | Admitting: Internal Medicine

## 2019-08-24 NOTE — Telephone Encounter (Signed)
Tonight onset of suprapubic pain - constant. No fever. Some difficulty with urination. No hematuria. Some decreased urine output.  He will try ibuprofen - if unable to tolerate pain would need to go to ED  Chart reviewed - having constipation and abd pain issues - has been referred to [place]

## 2019-08-25 ENCOUNTER — Encounter: Payer: Self-pay | Admitting: Gastroenterology

## 2019-08-25 DIAGNOSIS — R109 Unspecified abdominal pain: Secondary | ICD-10-CM | POA: Diagnosis not present

## 2019-08-25 DIAGNOSIS — K5792 Diverticulitis of intestine, part unspecified, without perforation or abscess without bleeding: Secondary | ICD-10-CM | POA: Diagnosis not present

## 2019-08-25 DIAGNOSIS — K5730 Diverticulosis of large intestine without perforation or abscess without bleeding: Secondary | ICD-10-CM | POA: Diagnosis not present

## 2019-08-25 DIAGNOSIS — R1032 Left lower quadrant pain: Secondary | ICD-10-CM | POA: Diagnosis not present

## 2019-08-26 NOTE — Telephone Encounter (Signed)
Thanks for taking care of him RG 

## 2019-08-28 ENCOUNTER — Telehealth: Payer: Self-pay | Admitting: Gastroenterology

## 2019-08-28 NOTE — Telephone Encounter (Signed)
Pt has questions regarding diverticulitis and liquid diet and requested a call back.

## 2019-08-28 NOTE — Telephone Encounter (Signed)
Left message for patient to call back to the office;  

## 2019-08-29 DIAGNOSIS — Z8719 Personal history of other diseases of the digestive system: Secondary | ICD-10-CM | POA: Diagnosis not present

## 2019-08-29 DIAGNOSIS — Z86010 Personal history of colonic polyps: Secondary | ICD-10-CM | POA: Diagnosis not present

## 2019-08-29 DIAGNOSIS — R194 Change in bowel habit: Secondary | ICD-10-CM | POA: Diagnosis not present

## 2019-08-29 DIAGNOSIS — R109 Unspecified abdominal pain: Secondary | ICD-10-CM | POA: Diagnosis not present

## 2019-08-29 DIAGNOSIS — G8929 Other chronic pain: Secondary | ICD-10-CM | POA: Diagnosis not present

## 2019-09-03 ENCOUNTER — Telehealth: Payer: Self-pay | Admitting: *Deleted

## 2019-09-03 NOTE — Telephone Encounter (Signed)
Please inform patient that we can use a low dose of steroids instead of an antibiotic to help with the inflammation associated with his lung issue.  He can use prednisone 10 mg daily for 10 days and keep in contact with [place] noting his response to this approach.

## 2019-09-03 NOTE — Telephone Encounter (Signed)
Brandon Gallagher wants to know if he can increase his Symbicort to 3 times per day. He states that he is developing bronchitis and his PCP is hesitant to provide antibiotics because of his history of C-diff. He is experiencing a productive cough and shortness of breath. Please advise.

## 2019-09-04 ENCOUNTER — Other Ambulatory Visit: Payer: Self-pay | Admitting: *Deleted

## 2019-09-04 MED ORDER — PREDNISONE 10 MG PO TABS: ORAL_TABLET | ORAL | 0 refills | Status: DC

## 2019-09-04 NOTE — Telephone Encounter (Signed)
Rx sent 

## 2019-09-04 NOTE — Telephone Encounter (Signed)
Patient informed. 

## 2019-09-05 ENCOUNTER — Other Ambulatory Visit: Payer: Self-pay | Admitting: Gastroenterology

## 2019-09-05 NOTE — Telephone Encounter (Signed)
Left message for patient to call back to the office;  

## 2019-09-05 NOTE — Telephone Encounter (Signed)
Patient returned call to the office-patient reports he went to the ER and he had been dx 'd with diverticulitis-patient reports he is having pains again since he ate meatloaf last night- Patient requests to have a refill on the medications that [facility] had prescribed to him because the symptoms lessened while he was on the medication but are returning since he has taken all of them and is now out  Patient reports he will call the pharmacy to initiate a refill request for Dr. Lyndel Safe Patient advised to call back to the office at [phone] should questions/concerns arise;  Patient verbalized understanding of information/instructions;

## 2019-09-08 ENCOUNTER — Telehealth: Payer: Self-pay | Admitting: Gastroenterology

## 2019-09-08 DIAGNOSIS — K5732 Diverticulitis of large intestine without perforation or abscess without bleeding: Secondary | ICD-10-CM | POA: Diagnosis not present

## 2019-09-08 NOTE — Telephone Encounter (Signed)
Please review previous message. 

## 2019-09-08 NOTE — Telephone Encounter (Signed)
Pt calling saying he had some pain and Dr. Lyndel Safe had told him that he thought it might be his back so pt went to get an MRI and there was nothing wrong. He is thinking that something is still wrong and is wanting to talk to Dr. Lyndel Safe about his symptoms and what it could be.

## 2019-09-08 NOTE — Telephone Encounter (Signed)
Pt seen his PCP and he got the medication that is needed. He wants to keep his appt on 11/2 with Dr. Lyndel Safe.

## 2019-09-08 NOTE — Telephone Encounter (Signed)
Called and spoke with patient-patient reports he wants to know if he can be scheduled for a f/u appt -patient has been scheduled for 09/15/2019 at 4:00pm; patient is requesting to know if he can have a refill of the antibiotics that were prescribed by the ER at [facility] -as the pain subsided with RX antibiotics-  Trisha-please obtain the medical records from ER visit at [facility] for Dr. Lyndel Safe to review (2 weeks ago from today)-08/25/2019-for antibiotic refill

## 2019-09-09 NOTE — Telephone Encounter (Signed)
No problems. pleasure to see him RG

## 2019-09-12 ENCOUNTER — Encounter: Payer: Self-pay | Admitting: *Deleted

## 2019-09-15 ENCOUNTER — Other Ambulatory Visit: Payer: Self-pay

## 2019-09-15 ENCOUNTER — Telehealth (INDEPENDENT_AMBULATORY_CARE_PROVIDER_SITE_OTHER): Payer: Medicare Other | Admitting: Gastroenterology

## 2019-09-15 ENCOUNTER — Encounter: Payer: Self-pay | Admitting: Gastroenterology

## 2019-09-15 DIAGNOSIS — K5792 Diverticulitis of intestine, part unspecified, without perforation or abscess without bleeding: Secondary | ICD-10-CM

## 2019-09-15 NOTE — Patient Instructions (Signed)
If you are age 59 or older, your body mass index should be between 23-30. Your There is no height or weight on file to calculate BMI. If this is out of the aforementioned range listed, please consider follow up with your Primary Care Provider.  If you are age 37 or younger, your body mass index should be between 19-25. Your There is no height or weight on file to calculate BMI. If this is out of the aformentioned range listed, please consider follow up with your Primary Care Provider.   Finish current course of Levaquin and Flagyl (5 days left). Can start probiotics.  He likes to take yogurt.  I encouraged him to take it every day.  Follow up if still with problems.   Thank you,  Dr. Jackquline Denmark

## 2019-09-15 NOTE — Progress Notes (Signed)
Chief Complaint: FU  Referring Provider:  Nicoletta Dress, MD      ASSESSMENT AND PLAN;   #1.  Recurrent sigmoid diverticulitis (CT AP with contrast 08/2019 at [facility]).  Treated with Levaquin plus Flagyl  (patient currently on the second course).  No abscess. Neg colon 02/2018 (Dr Melina Copa). FS 06/2019-mod sig div, colonic polyps s/p polypectomy (TAs) and int Hoids.  #2.  IBS with predominant constipation. Occ diarrhea.  Random colonic Bx- lymphocytic colitis on FS 06/2019.  #3. GERD with dysphagia d/t distal eso stricture/presbyesophagus s/p dil 06/2019. H/O food impaction S/P endoscopic disimpaction 04/2016. Eso Bx- neg for EoE. SB Bx neg for celiac  #4. H/O colonic polyps. Next colon due 02/2021.  #5.  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 -Can start probiotics.  He likes to take yogurt.  I encouraged him to take it every day. -If any further problems would consider repeat CT/MRI of the pelvis.  I have discussed various options. -Follow-up if still with problems.    Addendum: Colonoscopy report from Dr. Melina Copa 02/20/2018-4 small polyps measuring 4 to 6 mm s/p  Polypectomy. Bx- TAs.  Repeat in 3 years. CT AP 08/25/2019-acute sigmoid diverticulitis without abscess. CT 05/14/2019: Diverticulosis without diverticulitis, mild hepatic steatosis.  Otherwise normal. Reports sent for scanning. HPI:    Brandon Gallagher is a 58 y.o. male  FU from ED visit on 08/25/2019 to [facility] with left lower quadrant abdominal pain. Had normal CBC, CMP, UA Underwent CT abdomen/pelvis with contrast which showed acute sigmoid diverticulitis without abscess. Given course of Levaquin plus Flagyl for 10 days.  He did get better.  3 days after stopping antibiotics, he started having recurrence of abdominal pain.  Seen by Dr. Elissa Hefty.  Given another course of Levaquin and Flagyl for 10 days.  He is currently 5 days into second course  of antibiotics.  Does feel significantly better.  The abdominal pain is minimal.  He has minimal diarrhea.  Pleased with the progress.   SH-wife Becky works with Dr. Delena Bali  Past GI procedures: -Colonoscopy 10/22/2017 ([facility])-colonic polyps (17) status post polypectomy (Bx- TA), moderate predominantly sigmoid diverticulosis.  Random colonic biopsies-mild lymphocytic colitis.  Refused genetic test. Colonoscopy 2019 (Dr. Hedy Jacob report. -EGD8/2020-distal esophageal stricture S/P dilatation, negative small bowel biopsies, gastric polyps-benign.  06/08/2016-distal esophageal stricture s/p esophageal dilatation (54Fr), presbyesophagus, mild gastritis. Bx- neg for EoE, neg SB Bx. H/O food impaction status post endoscopic disimpaction 04/15/2016 Past Medical History:  Diagnosis Date  . Allergy   . Anxiety   . Arthritis   . Asthma   . Blood transfusion without reported diagnosis   . Bronchitis    last had back in 2017   Sees no pulmonary md  . COPD (chronic obstructive pulmonary disease) ([place])   . Depression   . Diverticulosis    last flare up was in Jan. 2018  Was on course of antibiotics for this  . Esophagus disorder    narrowing of esophagus.  Had to have dilatation 09/2016  . Family history of adverse reaction to anesthesia    son had severe N/V with his knee surgery  . GERD (gastroesophageal reflux disease)   . Headache   . History of Clostridioides difficile colitis   . History of colon polyps   . IBS (irritable bowel syndrome)   . Sleep apnea    no CPAP- unsure if true diagnosis or not  Past Surgical History:  Procedure Laterality Date  . ANTERIOR CERVICAL DECOMP/DISCECTOMY FUSION N/A 01/01/2017   Procedure: Cervical four- five and Cervical six- seven Anterior cervical discectomy with fusion and plate fixation;  Surgeon: Kevan Ny Ditty, MD;  Location: [place];  Service: Neurosurgery;  Laterality: N/A;  C4-5 and C6-7 Anterior cervical discectomy with fusion and plate  fixation  . BACK SURGERY     x 2    @ [facility]        Last one 09/2015  . CARDIAC CATHETERIZATION     He thinks.   Not really sure where at, thought it was here @ [facility], but its been a LONG time ?2000  . COLONOSCOPY  10/22/2017   Colonic polyp status post polypectomy. Moderate predominantly sigmoid diverticulosis.   . COLONOSCOPY  2019   Dr Orlena Sheldon  . ESOPHAGOGASTRODUODENOSCOPY  06/08/2016   Distal esophageal stricture status post esophageal dilatation. Presbtesophagus, also suggestive of motility disorder. Mild gastritis.   Marland Kitchen NASAL SINUS SURGERY     HAS HAD 2 OR 3.   LAST ONE 2011, IN [place]  (LEFT NASAL PASSAGE IS NARROWER THAN RIGHT  . UPPER GASTROINTESTINAL ENDOSCOPY      Family History  Problem Relation Age of Onset  . Heart disease Mother   . Stroke Father   . Colon cancer Neg Hx   . Esophageal cancer Neg Hx   . Stomach cancer Neg Hx   . Diabetes Neg Hx   . Rectal cancer Neg Hx     Social History   Tobacco Use  . Smoking status: Former Smoker    Packs/day: 1.00    Years: 30.00    Pack years: 30.00    Types: Cigarettes    Quit date: 05/12/2000    Years since quitting: 19.3  . Smokeless tobacco: Never Used  Substance Use Topics  . Alcohol use: No    Alcohol/week: 0.0 standard drinks  . Drug use: Not Currently    Current Outpatient Medications  Medication Sig Dispense Refill  . Acetaminophen (TYLENOL PO) Take 1 capsule by mouth as needed. Says it is a gel capsule that he takes    . ALPRAZolam (XANAX) 1 MG tablet Take 1 mg by mouth 2 (two) times daily as needed for anxiety.     Marland Kitchen ammonium lactate (LAC-HYDRIN) 12 % lotion 2 application daily.     Marland Kitchen levofloxacin (LEVAQUIN) 750 MG tablet Take 750 mg by mouth daily.    . metroNIDAZOLE (FLAGYL) 500 MG tablet Take 500 mg by mouth 3 (three) times daily.    . montelukast (SINGULAIR) 10 MG tablet Take 1 tablet (10 mg total) by mouth at bedtime. 30 tablet 5  . omeprazole (PRILOSEC) 20 MG capsule Take 20 mg by mouth 2  (two) times daily.    . SYMBICORT 160-4.5 MCG/ACT inhaler Inhale 2 puffs into the lungs 1-2 times daily 1 Inhaler 5  . Triamcinolone Acetonide (NASACORT AQ NA) Place 1 spray into the nose 2 (two) times daily.     No current facility-administered medications for this visit.     Allergies  Allergen Reactions  . Iodinated Diagnostic Agents     Other reaction(s): NO ALLERGY  . Latex     Other reaction(s): NO ALLERGY    Review of Systems:  neg     Physical Exam:    Not examined since it was a televisit.  Data Reviewed: I have personally reviewed following labs and imaging studies  CBC: CBC Latest Ref Rng &  Units 12/28/2016  WBC 4.0 - 10.5 K/uL 7.8  Hemoglobin 13.0 - 17.0 g/dL 16.7  Hematocrit 39.0 - 52.0 % 49.6  Platelets 150 - 400 K/uL 224    CMP: CMP Latest Ref Rng & Units 12/28/2016  Glucose 65 - 99 mg/dL 85  BUN 6 - 20 mg/dL 8  Creatinine 0.61 - 1.24 mg/dL 0.93  Sodium 135 - 145 mmol/L 140  Potassium 3.5 - 5.1 mmol/L 3.8  Chloride 101 - 111 mmol/L 105  CO2 22 - 32 mmol/L 25  Calcium 8.9 - 10.3 mg/dL 9.8  15 minutes spent with the patient today/.  Reviewing records from [place] than 50% was spent in counseling and coordination of care with the patient  Record review as above.  This service was provided via telemedicine.  The patient was located at home.  The provider was located in office.  The patient did consent to this telephone visit and is aware of possible charges through their insurance for this visit.       Carmell Austria, MD 09/15/2019, 4:30 PM  Cc: Nicoletta Dress, MD

## 2019-09-16 ENCOUNTER — Telehealth: Payer: Self-pay | Admitting: Gastroenterology

## 2019-09-16 DIAGNOSIS — E785 Hyperlipidemia, unspecified: Secondary | ICD-10-CM | POA: Diagnosis not present

## 2019-09-16 DIAGNOSIS — Z136 Encounter for screening for cardiovascular disorders: Secondary | ICD-10-CM | POA: Diagnosis not present

## 2019-09-16 DIAGNOSIS — Z9181 History of falling: Secondary | ICD-10-CM | POA: Diagnosis not present

## 2019-09-16 DIAGNOSIS — Z0000 Encounter for general adult medical examination without abnormal findings: Secondary | ICD-10-CM | POA: Diagnosis not present

## 2019-09-16 NOTE — Telephone Encounter (Signed)
Called and spoke with patient-patient reports he is having abd pain and wants to know if he should go back on the liquid diet while he is having this pain and is on the antibiotics? Has been doing low fiber but wants to know if going to liquid diet will help more   please advise

## 2019-09-16 NOTE — Telephone Encounter (Signed)
Pt states that he has been having a lot of pain from his diverticulitis today and wants to know if he should go on a liquid diet. Pls call him.

## 2019-09-17 NOTE — Telephone Encounter (Signed)
Lets try liquid diet for 24 hours and see if it helps Then can resume solids.  RG

## 2019-09-18 NOTE — Telephone Encounter (Signed)
Called and spoke with patient-informed of MD recommendations; patient is agreeable with plan of care and will follow instructions; Patient advised to call back to the office at [phone] should questions/concerns arise;  Patient verbalized understanding of information/instructions;

## 2019-09-19 ENCOUNTER — Telehealth: Payer: Self-pay | Admitting: Internal Medicine

## 2019-09-19 DIAGNOSIS — R197 Diarrhea, unspecified: Secondary | ICD-10-CM

## 2019-09-19 DIAGNOSIS — R109 Unspecified abdominal pain: Secondary | ICD-10-CM

## 2019-09-19 NOTE — Telephone Encounter (Signed)
Patient having continued problems with pain and suspected diverticulitis.  I asked him to wait until office is open to discuss furthje with Dr. Lyndel Safe  He is finished abx  Wants to do any future Ct at [facility]

## 2019-09-22 ENCOUNTER — Telehealth: Payer: Self-pay | Admitting: Gastroenterology

## 2019-09-22 DIAGNOSIS — K580 Irritable bowel syndrome with diarrhea: Secondary | ICD-10-CM | POA: Diagnosis not present

## 2019-09-22 DIAGNOSIS — R197 Diarrhea, unspecified: Secondary | ICD-10-CM | POA: Diagnosis not present

## 2019-09-22 DIAGNOSIS — R1084 Generalized abdominal pain: Secondary | ICD-10-CM | POA: Diagnosis not present

## 2019-09-22 NOTE — Telephone Encounter (Signed)
Called and spoke with patient- patient reports he is still having diarrhea and the abd pain has progressively worsened; patient has requested the stool samples that Dr. Lyndel Safe has asked the patient to completed -to be ordered at [facility] for transportation convenience- orders have been sent over to [facility] via fax-  Patient has also requested that the CT scan be ordered as he and Dr. Lyndel Safe talked about this and he is requesting it be ordered at [facility] as well-  Please advise if CT scan is appropriate for this patient-with diagnosis for ordering please

## 2019-09-22 NOTE — Telephone Encounter (Signed)
Thx Durwin Nora   Bre, Has he been seen at [facility] for another opinion? RG

## 2019-09-22 NOTE — Telephone Encounter (Signed)
Orders faxed to [phone] as requested;

## 2019-09-22 NOTE — Telephone Encounter (Signed)
Brandon Gallagher with [facility] hosp outpt called to inform that pt dropped stool sample and they do not have the orders. Pt stated that he should have had this done in August. Pls fax orders to [phone] or call Brandon Gallagher.

## 2019-09-22 NOTE — Telephone Encounter (Signed)
Proceed with CT scan Abdo/pelvis with p.o. and IV contrast at [facility] Does he have appointment at [facility]? [place]

## 2019-09-23 ENCOUNTER — Telehealth: Payer: Self-pay | Admitting: *Deleted

## 2019-09-23 MED ORDER — DIPHENHYDRAMINE HCL 25 MG PO CAPS: ORAL_CAPSULE | ORAL | 0 refills | Status: DC

## 2019-09-23 MED ORDER — PREDNISONE 50 MG PO TABS: ORAL_TABLET | ORAL | 0 refills | Status: DC

## 2019-09-23 NOTE — Telephone Encounter (Signed)
Called and spoke with patient-patient reports he has been seen by MD at [place] and they agreed with Dr. Steve Rattler recommendations-patient was given a recommendation to see a pain clinic-has not been seen due to diverticulitis flare-since being on antibiotics pain has decreased but not resolved-unable to get a full nights sleep-reports he has had a few episodes of bowel incontinence-has not been able to try the probiotic but has been using yogurt for a probiotic-diarrhea is not improving even with the use of yogurt-diarrhea happens if the patient sneezes-  Patient reports the first round of antibiotics-the pain resolved=the second round of antibiotics-the pain is still present and worse sometimes more than others  Patient has requested the CT scan be scheduled at [facility] abd/pel with po/IV contrast has been ordered and scheduled at [facility] on 09/26/2019 at 2:30pm arrival time, 3:00pm scan-patient is to be NPO for 2 hours prior to scan-patient is aware of need to pick up prep kit from [facility] and of the appt date/time of CT;   Please advise if further action is needed at this time;

## 2019-09-23 NOTE — Telephone Encounter (Signed)
Pt calling again about below message

## 2019-09-23 NOTE — Telephone Encounter (Signed)
Patient called stating that he is having a hard time with his acid reflux and feeling like the medications he is using are not helping. Patient is wondering if he Dr.Kozlow has any recommendations. Pharmacy is [facility].

## 2019-09-23 NOTE — Telephone Encounter (Signed)
Scheduled Brandon Gallagher to come in office next Thursday.

## 2019-09-23 NOTE — Telephone Encounter (Signed)
See additional charting

## 2019-09-23 NOTE — Telephone Encounter (Signed)
Patient has contrast allergies-pre meds have been sent in to the patient's pharmacy for the patient to have prior to his CT scan;

## 2019-09-23 NOTE — Telephone Encounter (Signed)
Please move up patient's appointment to next week.  I believe he had an appointment in December.  We can address his issue with that appointment.

## 2019-09-25 ENCOUNTER — Encounter: Payer: Self-pay | Admitting: Gastroenterology

## 2019-09-26 ENCOUNTER — Telehealth: Payer: Self-pay | Admitting: Gastroenterology

## 2019-09-26 DIAGNOSIS — R197 Diarrhea, unspecified: Secondary | ICD-10-CM | POA: Diagnosis not present

## 2019-09-26 DIAGNOSIS — K5792 Diverticulitis of intestine, part unspecified, without perforation or abscess without bleeding: Secondary | ICD-10-CM | POA: Diagnosis not present

## 2019-09-26 DIAGNOSIS — K5730 Diverticulosis of large intestine without perforation or abscess without bleeding: Secondary | ICD-10-CM | POA: Diagnosis not present

## 2019-09-26 NOTE — Telephone Encounter (Signed)
Please review previous message. 

## 2019-09-26 NOTE — Telephone Encounter (Signed)
Contacted patient and understands. He is instructed to call us in one week to update [place] about how he is doing. Also said that his labs from [place] came back negative. Faxed medical release already to see about this

## 2019-09-26 NOTE — Telephone Encounter (Signed)
Getting Ct scan done right now and according to [facility] all stool samples were negative. He just wanted to let you know.

## 2019-09-26 NOTE — Telephone Encounter (Signed)
CT placed on Dr. Steve Rattler desk from [facility]

## 2019-09-26 NOTE — Telephone Encounter (Signed)
CT reviewed from 09/26/2019 Much improved diverticulitis  Please inform him. Let us know if he still has problems.

## 2019-09-27 DIAGNOSIS — R42 Dizziness and giddiness: Secondary | ICD-10-CM | POA: Diagnosis not present

## 2019-09-27 DIAGNOSIS — E785 Hyperlipidemia, unspecified: Secondary | ICD-10-CM | POA: Diagnosis not present

## 2019-09-27 DIAGNOSIS — R7301 Impaired fasting glucose: Secondary | ICD-10-CM | POA: Diagnosis not present

## 2019-09-29 ENCOUNTER — Telehealth: Payer: Self-pay | Admitting: Gastroenterology

## 2019-09-29 NOTE — Telephone Encounter (Signed)
Thanks Can we get blood work reports when available RG

## 2019-09-29 NOTE — Telephone Encounter (Signed)
Called and spoke with patient-patient reports he had lab work done for his PCP-just wanted to let Dr. Lyndel Safe know about it-

## 2019-09-29 NOTE — Telephone Encounter (Signed)
Labs placed on Dr. Steve Rattler desk for review

## 2019-09-30 ENCOUNTER — Telehealth: Payer: Self-pay | Admitting: Gastroenterology

## 2019-09-30 DIAGNOSIS — K5792 Diverticulitis of intestine, part unspecified, without perforation or abscess without bleeding: Secondary | ICD-10-CM

## 2019-09-30 DIAGNOSIS — R197 Diarrhea, unspecified: Secondary | ICD-10-CM

## 2019-09-30 NOTE — Telephone Encounter (Signed)
Brandon Gallagher, will you please request these lab results from the patient's PCP for Dr. Lyndel Safe to review? (Dr. Carolanne Grumbling is the PCP)

## 2019-09-30 NOTE — Telephone Encounter (Signed)
MR sent to PCP. Waiting for response

## 2019-09-30 NOTE — Telephone Encounter (Signed)
Please advise 

## 2019-09-30 NOTE — Telephone Encounter (Signed)
Patient is calling saying that the Hemorrhoids are still bothering him asking if there is anything he can use other than wipes and creame. Doctor said no to using yogurt- no dairy- does he want him to use fiber?

## 2019-10-01 ENCOUNTER — Ambulatory Visit (INDEPENDENT_AMBULATORY_CARE_PROVIDER_SITE_OTHER): Payer: Medicare Other | Admitting: Allergy and Immunology

## 2019-10-01 ENCOUNTER — Encounter: Payer: Self-pay | Admitting: Allergy and Immunology

## 2019-10-01 ENCOUNTER — Ambulatory Visit: Payer: Medicare Other | Admitting: Allergy and Immunology

## 2019-10-01 ENCOUNTER — Other Ambulatory Visit: Payer: Self-pay

## 2019-10-01 VITALS — BP 122/80 | HR 76 | Temp 97.8°F | Resp 20

## 2019-10-01 DIAGNOSIS — D801 Nonfamilial hypogammaglobulinemia: Secondary | ICD-10-CM | POA: Diagnosis not present

## 2019-10-01 DIAGNOSIS — B999 Unspecified infectious disease: Secondary | ICD-10-CM | POA: Diagnosis not present

## 2019-10-01 DIAGNOSIS — K219 Gastro-esophageal reflux disease without esophagitis: Secondary | ICD-10-CM

## 2019-10-01 DIAGNOSIS — J324 Chronic pansinusitis: Secondary | ICD-10-CM | POA: Diagnosis not present

## 2019-10-01 DIAGNOSIS — J4540 Moderate persistent asthma, uncomplicated: Secondary | ICD-10-CM | POA: Diagnosis not present

## 2019-10-01 MED ORDER — ALBUTEROL SULFATE HFA 108 (90 BASE) MCG/ACT IN AERS: INHALATION_SPRAY | RESPIRATORY_TRACT | 1 refills | Status: DC

## 2019-10-01 NOTE — Patient Instructions (Addendum)
  1.  Continue to treat and prevent inflammation:   A.  OTC Nasacort-1 spray each nostril 1 time per day  B.  Symbicort 160- 2 inhalations 1-2  times per day     C.  Montelukast 10 mg - 1 tablet 1 time per day  2.  If needed:   A.  Nasal saline  B.  Albuterol HFA-2 inhalations every 4-6 hours  3.  Can treat reflux with the following:   A. Dexilant 60 mg - 1 tablet in AM (samples)  B. Famotidine 40 mg - 1 tablet in PM  4.  Obtain Covid vaccine when available  5.  Return to clinic in 12 weeks or earlier if problem

## 2019-10-01 NOTE — Telephone Encounter (Signed)
Lets retry -fluticasone cream 0.05% generic 30g 1 bid PR x 10 days, 2 refills -Sitz baths BID x 7 days. RG

## 2019-10-01 NOTE — Progress Notes (Signed)
[place]   Follow-up Note  Referring Provider: Nicoletta Dress, MD Primary Provider: Nicoletta Dress, MD Date of Office Visit: 10/01/2019  Subjective:   Brandon Gallagher (DOB: March 02, 1961) is a 58 y.o. 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  His last visit to this clinic was 31 July 2019.  His airway issue is doing great.  He feels as though his breathing is good for both his nose and his chest and he does not need to use a short acting bronchodilator while he continues on anti-inflammatory agents including Nasacort, Symbicort, and montelukast.  His abdominal issue is still a major issue.  He is presently using Entocort and he just went through a treatment for CT scan documented diverticulitis which actually did appear to help him somewhat regarding his chronic GI issues.  He has developed a fair amount of burning and churning which wakes him up at nighttime in his chest and stomach even while using omeprazole 20 mg twice a day and famotidine.  He does not consume caffeine or chocolate.  Allergies as of 10/01/2019      Reactions   Iodinated Diagnostic Agents    Other reaction(s): NO ALLERGY   Latex    Other reaction(s): NO ALLERGY      Medication List    ALPRAZolam 1 MG tablet Commonly known as: XANAX Take 1 mg by mouth 2 (two) times daily as needed for anxiety.   ammonium lactate 12 % lotion Commonly known as: LAC-HYDRIN 2 application daily.   famotidine 40 MG tablet Commonly known as: PEPCID Take 20 mg by mouth 2 (two) times daily.   montelukast 10 MG tablet Commonly known as: SINGULAIR Take 1 tablet (10  mg total) by mouth at bedtime.   NASACORT AQ NA Place 1 spray into the nose 2 (two) times daily.   omeprazole 20 MG capsule Commonly known as: PRILOSEC Take 20 mg by mouth 2 (two) times daily.   Symbicort 160-4.5 MCG/ACT inhaler Generic drug: budesonide-formoterol Inhale 2 puffs into the lungs 1-2 times daily       Past Medical History:  Diagnosis Date  . Allergy   . Anxiety   . Arthritis   . Asthma   . Blood transfusion without reported diagnosis   . Bronchitis    last had back in 2017   Sees no pulmonary md  . COPD (chronic obstructive pulmonary disease) ([place])   . Depression   . Diverticulosis    last flare up was in Jan. 2018  Was on course of antibiotics for this  . Esophagus disorder    narrowing of esophagus.  Had to have dilatation 09/2016  . Family history of adverse reaction to anesthesia    son had severe N/V with his knee surgery  . GERD (gastroesophageal reflux disease)   . Headache   . History of Clostridioides difficile colitis   . History of colon polyps   . IBS (irritable bowel syndrome)   . Sleep apnea    no CPAP- unsure if true diagnosis or not    Past Surgical History:  Procedure Laterality Date  . ANTERIOR CERVICAL DECOMP/DISCECTOMY FUSION N/A 01/01/2017   Procedure: Cervical  four- five and Cervical six- seven Anterior cervical discectomy with fusion and plate fixation;  Surgeon: Kevan Ny Ditty, MD;  Location: [place];  Service: Neurosurgery;  Laterality: N/A;  C4-5 and C6-7 Anterior cervical discectomy with fusion and plate fixation  . BACK SURGERY     x 2    @ [facility]        Last one 09/2015  . CARDIAC CATHETERIZATION     He thinks.   Not really sure where at, thought it was here @ [facility], but its been a LONG time ?2000  . COLONOSCOPY  10/22/2017   Colonic polyp status post polypectomy. Moderate predominantly sigmoid diverticulosis.   . COLONOSCOPY  2019   Dr Orlena Sheldon  . ESOPHAGOGASTRODUODENOSCOPY  06/08/2016   Distal esophageal  stricture status post esophageal dilatation. Presbtesophagus, also suggestive of motility disorder. Mild gastritis.   Marland Kitchen NASAL SINUS SURGERY     HAS HAD 2 OR 3.   LAST ONE 2011, IN [place]  (LEFT NASAL PASSAGE IS NARROWER THAN RIGHT  . UPPER GASTROINTESTINAL ENDOSCOPY      Review of systems negative except as noted in HPI / PMHx or noted below:  Review of Systems  Constitutional: Negative.   HENT: Negative.   Eyes: Negative.   Respiratory: Negative.   Cardiovascular: Negative.   Gastrointestinal: Negative.   Genitourinary: Negative.   Musculoskeletal: Negative.   Skin: Negative.   Neurological: Negative.   Endo/Heme/Allergies: Negative.   Psychiatric/Behavioral: Negative.      Objective:   Vitals:   10/01/19 1515  BP: 122/80  Pulse: 76  Resp: 20  Temp: 97.8 F (36.6 C)  SpO2: 98%          Physical Exam Constitutional:      Appearance: He is not diaphoretic.  HENT:     Head: Normocephalic.     Right Ear: Tympanic membrane, ear canal and external ear normal.     Left Ear: Tympanic membrane, ear canal and external ear normal.     Nose: Nose normal. No mucosal edema or rhinorrhea.     Mouth/Throat:     Pharynx: Uvula midline. No oropharyngeal exudate.  Eyes:     Conjunctiva/sclera: Conjunctivae normal.  Neck:     Thyroid: No thyromegaly.     Trachea: Trachea normal. No tracheal tenderness or tracheal deviation.  Cardiovascular:     Rate and Rhythm: Normal rate and regular rhythm.     Heart sounds: Normal heart sounds, S1 normal and S2 normal. No murmur.  Pulmonary:     Effort: No respiratory distress.     Breath sounds: Normal breath sounds. No stridor. No wheezing or rales.  Lymphadenopathy:     Head:     Right side of head: No tonsillar adenopathy.     Left side of head: No tonsillar adenopathy.     Cervical: No cervical adenopathy.  Skin:    Findings: No erythema or rash.     Nails: There is no clubbing.   Neurological:     Mental Status: He is  alert.     Diagnostics:    Spirometry was performed and demonstrated an FEV1 of 3.33 at 89 % of predicted.  Results of blood tests obtained 15 August 2019 identified very high levels of antibodies directed against multiple serotypes of pneumococcus that occurred as a result of immunization with Pneumovax 4 weeks previously.  Assessment and Plan:   1. Hypogammaglobulinemia ([place])   2. Recurrent infections   3. Asthma, moderate persistent, well-controlled  4. Chronic pansinusitis   5. Gastroesophageal reflux disease, unspecified whether esophagitis present     1.  Continue to treat and prevent inflammation:   A.  OTC Nasacort-1 spray each nostril 1 time per day  B.  Symbicort 160- 2 inhalations 1-2  times per day     C.  Montelukast 10 mg - 1 tablet 1 time per day  2.  If needed:   A.  Nasal saline  B.  Albuterol HFA-2 inhalations every 4-6 hours  3.  Can treat reflux with the following:   A. Dexilant 60 mg - 1 tablet in AM (samples)  B. Famotidine 40 mg - 1 tablet in PM  4.  Obtain Covid vaccine when available  5.  Return to clinic in 12 weeks or earlier if problem  [name] appears to have very good control of his airway issue and will continue to have him use anti-inflammatory agents for his airway.  His hypogammaglobulinemia does not need to be assessed at this point based upon his rather significant immunologic response to pneumococcal vaccination.  His reflux appears to be little bit active and I given him some samples of Dexilant to try in conjunction with famotidine.  His lower GI issue tied up with diverticulitis and inflammatory bowel disease is not really something I can help him with at this point.  He will follow up with Dr. Lyndel Safe concerning this issue.  I will see him back in his clinic in 12 weeks or earlier if there is a problem.  Allena Katz, MD Allergy / Immunology [place]

## 2019-10-02 ENCOUNTER — Encounter: Payer: Self-pay | Admitting: Allergy and Immunology

## 2019-10-02 ENCOUNTER — Ambulatory Visit: Payer: Self-pay | Admitting: Allergy and Immunology

## 2019-10-02 ENCOUNTER — Ambulatory Visit: Payer: Medicare Other | Admitting: Allergy and Immunology

## 2019-10-02 MED ORDER — FLUTICASONE PROPIONATE 0.05 % EX CREA: TOPICAL_CREAM | Freq: Two times a day (BID) | CUTANEOUS | 2 refills | Status: DC

## 2019-10-02 NOTE — Telephone Encounter (Signed)
RX sent to pharmacy; awaiting patient's call back to the office;

## 2019-10-02 NOTE — Telephone Encounter (Signed)
Left message for patient to call back to the office;  

## 2019-10-03 NOTE — Telephone Encounter (Signed)
Spoke with pt and he is aware. 

## 2019-10-03 NOTE — Telephone Encounter (Signed)
Pt returned your call, pls call him again. °

## 2019-10-19 DIAGNOSIS — U071 COVID-19: Secondary | ICD-10-CM | POA: Diagnosis not present

## 2019-10-19 DIAGNOSIS — R6889 Other general symptoms and signs: Secondary | ICD-10-CM | POA: Diagnosis not present

## 2019-10-23 ENCOUNTER — Ambulatory Visit: Payer: Medicare Other | Admitting: Allergy and Immunology

## 2019-10-27 ENCOUNTER — Telehealth: Payer: Self-pay | Admitting: Gastroenterology

## 2019-10-27 DIAGNOSIS — R05 Cough: Secondary | ICD-10-CM | POA: Diagnosis not present

## 2019-10-27 DIAGNOSIS — R197 Diarrhea, unspecified: Secondary | ICD-10-CM | POA: Diagnosis not present

## 2019-10-27 DIAGNOSIS — J1289 Other viral pneumonia: Secondary | ICD-10-CM | POA: Diagnosis not present

## 2019-10-27 DIAGNOSIS — K5730 Diverticulosis of large intestine without perforation or abscess without bleeding: Secondary | ICD-10-CM | POA: Diagnosis not present

## 2019-10-27 DIAGNOSIS — R109 Unspecified abdominal pain: Secondary | ICD-10-CM | POA: Diagnosis not present

## 2019-10-27 DIAGNOSIS — R0902 Hypoxemia: Secondary | ICD-10-CM | POA: Diagnosis not present

## 2019-10-27 DIAGNOSIS — U071 COVID-19: Secondary | ICD-10-CM | POA: Diagnosis not present

## 2019-10-27 NOTE — Telephone Encounter (Signed)
Please review and advise.

## 2019-10-27 NOTE — Telephone Encounter (Signed)
Pt called again stating that he was going to go the ED at [facility]. He would like a call back anyway.

## 2019-10-28 DIAGNOSIS — U071 COVID-19: Secondary | ICD-10-CM | POA: Diagnosis not present

## 2019-10-28 NOTE — Telephone Encounter (Signed)
Please call him and see how he is doing. Has to get over COVID-19.  I hope it is not bad for him and he recovers fast. He could have painful diverticular disease We will get surgical opinion (for painful diverticular disease) once he is better from COVID-19. For now please continue high-fiber diet. He will get antibiotics if he has Covid pneumonia (mostly azithromycin)  Please let me know  RG

## 2019-10-29 DIAGNOSIS — G629 Polyneuropathy, unspecified: Secondary | ICD-10-CM | POA: Diagnosis not present

## 2019-10-29 DIAGNOSIS — J1289 Other viral pneumonia: Secondary | ICD-10-CM | POA: Diagnosis not present

## 2019-10-29 DIAGNOSIS — Z9981 Dependence on supplemental oxygen: Secondary | ICD-10-CM | POA: Diagnosis not present

## 2019-10-29 DIAGNOSIS — K219 Gastro-esophageal reflux disease without esophagitis: Secondary | ICD-10-CM | POA: Diagnosis not present

## 2019-10-29 DIAGNOSIS — M1991 Primary osteoarthritis, unspecified site: Secondary | ICD-10-CM | POA: Diagnosis not present

## 2019-10-29 DIAGNOSIS — U071 COVID-19: Secondary | ICD-10-CM | POA: Diagnosis not present

## 2019-10-29 DIAGNOSIS — K5792 Diverticulitis of intestine, part unspecified, without perforation or abscess without bleeding: Secondary | ICD-10-CM | POA: Diagnosis not present

## 2019-10-29 NOTE — Telephone Encounter (Signed)
Called and spoke with patient-patient informed of MD recommendations; patient is agreeable with plan of care; Patient verbalized understanding of information/instructions;  Patient was advised to call the office at [phone] if questions/concerns arise; patient will call back to the office 14 days post symptoms for surgical referral to be sent;

## 2019-10-30 ENCOUNTER — Inpatient Hospital Stay (HOSPITAL_COMMUNITY)
Admission: AD | Admit: 2019-10-30 | Discharge: 2019-11-05 | DRG: 177 | Disposition: A | Discharge status: Home Health | Payer: Medicare Other | Source: Other Acute Inpatient Hospital | Attending: Internal Medicine | Admitting: Internal Medicine

## 2019-10-30 ENCOUNTER — Encounter (HOSPITAL_COMMUNITY): Payer: Self-pay | Admitting: Family Medicine

## 2019-10-30 DIAGNOSIS — K5792 Diverticulitis of intestine, part unspecified, without perforation or abscess without bleeding: Secondary | ICD-10-CM | POA: Diagnosis present

## 2019-10-30 DIAGNOSIS — R778 Other specified abnormalities of plasma proteins: Secondary | ICD-10-CM | POA: Diagnosis not present

## 2019-10-30 DIAGNOSIS — R918 Other nonspecific abnormal finding of lung field: Secondary | ICD-10-CM | POA: Diagnosis not present

## 2019-10-30 DIAGNOSIS — D802 Selective deficiency of immunoglobulin A [IgA]: Secondary | ICD-10-CM

## 2019-10-30 DIAGNOSIS — Z87891 Personal history of nicotine dependence: Secondary | ICD-10-CM

## 2019-10-30 DIAGNOSIS — R001 Bradycardia, unspecified: Secondary | ICD-10-CM

## 2019-10-30 DIAGNOSIS — Z7951 Long term (current) use of inhaled steroids: Secondary | ICD-10-CM

## 2019-10-30 DIAGNOSIS — E0590 Thyrotoxicosis, unspecified without thyrotoxic crisis or storm: Secondary | ICD-10-CM | POA: Diagnosis present

## 2019-10-30 DIAGNOSIS — Z7401 Bed confinement status: Secondary | ICD-10-CM | POA: Diagnosis not present

## 2019-10-30 DIAGNOSIS — K5790 Diverticulosis of intestine, part unspecified, without perforation or abscess without bleeding: Secondary | ICD-10-CM | POA: Diagnosis present

## 2019-10-30 DIAGNOSIS — F419 Anxiety disorder, unspecified: Secondary | ICD-10-CM | POA: Diagnosis present

## 2019-10-30 DIAGNOSIS — K219 Gastro-esophageal reflux disease without esophagitis: Secondary | ICD-10-CM | POA: Diagnosis not present

## 2019-10-30 DIAGNOSIS — U071 COVID-19: Secondary | ICD-10-CM | POA: Diagnosis not present

## 2019-10-30 DIAGNOSIS — J9601 Acute respiratory failure with hypoxia: Secondary | ICD-10-CM | POA: Diagnosis not present

## 2019-10-30 DIAGNOSIS — Z823 Family history of stroke: Secondary | ICD-10-CM

## 2019-10-30 DIAGNOSIS — Z981 Arthrodesis status: Secondary | ICD-10-CM

## 2019-10-30 DIAGNOSIS — J449 Chronic obstructive pulmonary disease, unspecified: Secondary | ICD-10-CM | POA: Diagnosis present

## 2019-10-30 DIAGNOSIS — J069 Acute upper respiratory infection, unspecified: Secondary | ICD-10-CM | POA: Diagnosis not present

## 2019-10-30 DIAGNOSIS — J1289 Other viral pneumonia: Secondary | ICD-10-CM | POA: Diagnosis not present

## 2019-10-30 DIAGNOSIS — G4730 Sleep apnea, unspecified: Secondary | ICD-10-CM | POA: Diagnosis present

## 2019-10-30 DIAGNOSIS — M2550 Pain in unspecified joint: Secondary | ICD-10-CM | POA: Diagnosis not present

## 2019-10-30 DIAGNOSIS — R0902 Hypoxemia: Secondary | ICD-10-CM | POA: Diagnosis not present

## 2019-10-30 DIAGNOSIS — Z8249 Family history of ischemic heart disease and other diseases of the circulatory system: Secondary | ICD-10-CM

## 2019-10-30 DIAGNOSIS — R0602 Shortness of breath: Secondary | ICD-10-CM | POA: Diagnosis not present

## 2019-10-30 DIAGNOSIS — Z743 Need for continuous supervision: Secondary | ICD-10-CM | POA: Diagnosis not present

## 2019-10-30 HISTORY — DX: Acute respiratory failure with hypoxia: J96.01

## 2019-10-30 HISTORY — DX: Selective deficiency of immunoglobulin a (iga): D80.2

## 2019-10-30 HISTORY — DX: COVID-19: U07.1

## 2019-10-30 HISTORY — DX: Bradycardia, unspecified: R00.1

## 2019-10-30 HISTORY — DX: Acute upper respiratory infection, unspecified: J06.9

## 2019-10-30 LAB — COMPREHENSIVE METABOLIC PANEL
ALT: 36 U/L (ref 0–44)
AST: 40 U/L (ref 15–41)
Albumin: 2.9 g/dL — ABNORMAL LOW (ref 3.5–5.0)
Alkaline Phosphatase: 58 U/L (ref 38–126)
Anion gap: 10 (ref 5–15)
BUN: 18 mg/dL (ref 6–20)
CO2: 22 mmol/L (ref 22–32)
Calcium: 8.8 mg/dL — ABNORMAL LOW (ref 8.9–10.3)
Chloride: 108 mmol/L (ref 98–111)
Creatinine, Ser: 0.9 mg/dL (ref 0.61–1.24)
GFR calc Af Amer: >60 mL/min (ref >60)
GFR calc non Af Amer: >60 mL/min (ref >60)
Glucose, Bld: 166 mg/dL — ABNORMAL HIGH (ref 70–99)
Potassium: 4.5 mmol/L (ref 3.5–5.1)
Sodium: 140 mmol/L (ref 135–145)
Total Bilirubin: 1 mg/dL (ref 0.3–1.2)
Total Protein: 6.4 g/dL — ABNORMAL LOW (ref 6.5–8.1)

## 2019-10-30 LAB — CBC WITH DIFFERENTIAL/PLATELET
Abs Immature Granulocytes: 0.18 10*3/uL — ABNORMAL HIGH (ref 0.00–0.07)
Basophils Absolute: 0 10*3/uL (ref 0.0–0.1)
Basophils Relative: 0 %
Eosinophils Absolute: 0 10*3/uL (ref 0.0–0.5)
Eosinophils Relative: 0 %
HCT: 43.1 % (ref 39.0–52.0)
Hemoglobin: 14.6 g/dL (ref 13.0–17.0)
Immature Granulocytes: 2 %
Lymphocytes Relative: 7 %
Lymphs Abs: 0.6 10*3/uL — ABNORMAL LOW (ref 0.7–4.0)
MCH: 30.9 pg (ref 26.0–34.0)
MCHC: 33.9 g/dL (ref 30.0–36.0)
MCV: 91.1 fL (ref 80.0–100.0)
Monocytes Absolute: 0.3 10*3/uL (ref 0.1–1.0)
Monocytes Relative: 3 %
Neutro Abs: 8.4 10*3/uL — ABNORMAL HIGH (ref 1.7–7.7)
Neutrophils Relative %: 88 %
Platelets: 251 10*3/uL (ref 150–400)
RBC: 4.73 MIL/uL (ref 4.22–5.81)
RDW: 12.6 % (ref 11.5–15.5)
WBC: 9.5 10*3/uL (ref 4.0–10.5)
nRBC: 0 % (ref 0.0–0.2)

## 2019-10-30 LAB — BRAIN NATRIURETIC PEPTIDE: B Natriuretic Peptide: 255.5 pg/mL — ABNORMAL HIGH (ref 0.0–100.0)

## 2019-10-30 LAB — TSH: TSH: 0.308 u[IU]/mL — ABNORMAL LOW (ref 0.350–4.500)

## 2019-10-30 LAB — LACTATE DEHYDROGENASE: LDH: 279 U/L — ABNORMAL HIGH (ref 98–192)

## 2019-10-30 LAB — C-REACTIVE PROTEIN: CRP: 5 mg/dL — ABNORMAL HIGH (ref <1.0)

## 2019-10-30 MED ORDER — PANTOPRAZOLE SODIUM 40 MG PO TBEC
40.0000 mg | DELAYED_RELEASE_TABLET | Freq: Every day | ORAL | Status: DC
  Administered 2019-10-30 – 2019-11-05 (×7): 40 mg via ORAL
  Filled 2019-10-30 (×7): qty 1

## 2019-10-30 MED ORDER — SODIUM CHLORIDE 0.9% FLUSH: 3.0000 mL | INTRAVENOUS | Status: DC | PRN

## 2019-10-30 MED ORDER — ALBUTEROL SULFATE HFA 108 (90 BASE) MCG/ACT IN AERS
2.0000 | INHALATION_SPRAY | RESPIRATORY_TRACT | Status: DC | PRN
  Filled 2019-10-30: qty 6.7

## 2019-10-30 MED ORDER — ACETAMINOPHEN 325 MG PO TABS: 650.0000 mg | ORAL_TABLET | Freq: Four times a day (QID) | ORAL | Status: DC | PRN

## 2019-10-30 MED ORDER — ONDANSETRON HCL 4 MG PO TABS: 4.0000 mg | ORAL_TABLET | Freq: Four times a day (QID) | ORAL | Status: DC | PRN

## 2019-10-30 MED ORDER — DEXAMETHASONE SODIUM PHOSPHATE 10 MG/ML IJ SOLN: 6.0000 mg | INTRAMUSCULAR | Status: DC

## 2019-10-30 MED ORDER — SODIUM CHLORIDE 0.9 % IV SOLN: 250.0000 mL | INTRAVENOUS | Status: DC | PRN

## 2019-10-30 MED ORDER — ALPRAZOLAM 0.5 MG PO TABS
1.0000 mg | ORAL_TABLET | Freq: Two times a day (BID) | ORAL | Status: DC | PRN
  Administered 2019-10-30: 1 mg via ORAL
  Filled 2019-10-30: qty 2

## 2019-10-30 MED ORDER — MONTELUKAST SODIUM 10 MG PO TABS
10.0000 mg | ORAL_TABLET | Freq: Every day | ORAL | Status: DC
  Administered 2019-10-30 – 2019-11-04 (×6): 10 mg via ORAL
  Filled 2019-10-30 (×6): qty 1

## 2019-10-30 MED ORDER — DEXAMETHASONE SODIUM PHOSPHATE 10 MG/ML IJ SOLN
6.0000 mg | INTRAMUSCULAR | Status: DC
  Administered 2019-10-30: 6 mg via INTRAVENOUS
  Filled 2019-10-30: qty 1

## 2019-10-30 MED ORDER — ENOXAPARIN SODIUM 60 MG/0.6ML ~~LOC~~ SOLN
60.0000 mg | SUBCUTANEOUS | Status: DC
  Administered 2019-10-31 – 2019-11-04 (×5): 60 mg via SUBCUTANEOUS
  Filled 2019-10-30 (×6): qty 0.6

## 2019-10-30 MED ORDER — FLUTICASONE PROPIONATE 50 MCG/ACT NA SUSP
1.0000 | Freq: Every day | NASAL | Status: DC
  Administered 2019-10-31 – 2019-11-05 (×6): 1 via NASAL
  Filled 2019-10-30: qty 16

## 2019-10-30 MED ORDER — FLUTICASONE FUROATE-VILANTEROL 200-25 MCG/INH IN AEPB
1.0000 | INHALATION_SPRAY | Freq: Every day | RESPIRATORY_TRACT | Status: DC
  Administered 2019-10-31 – 2019-11-05 (×6): 1 via RESPIRATORY_TRACT
  Filled 2019-10-30: qty 28

## 2019-10-30 MED ORDER — ENOXAPARIN SODIUM 60 MG/0.6ML ~~LOC~~ SOLN
60.0000 mg | SUBCUTANEOUS | Status: DC
  Administered 2019-10-30: 60 mg via SUBCUTANEOUS
  Filled 2019-10-30: qty 0.6

## 2019-10-30 MED ORDER — SODIUM CHLORIDE 0.9% FLUSH
3.0000 mL | Freq: Two times a day (BID) | INTRAVENOUS | Status: DC
  Administered 2019-10-30 – 2019-11-02 (×6): 3 mL via INTRAVENOUS

## 2019-10-30 MED ORDER — SODIUM CHLORIDE 0.9 % IV SOLN
500.0000 mg | INTRAVENOUS | Status: AC
  Administered 2019-10-31 – 2019-11-01 (×2): 500 mg via INTRAVENOUS
  Filled 2019-10-30 (×2): qty 500

## 2019-10-30 MED ORDER — ALBUTEROL SULFATE HFA 108 (90 BASE) MCG/ACT IN AERS
2.0000 | INHALATION_SPRAY | Freq: Four times a day (QID) | RESPIRATORY_TRACT | Status: DC
  Administered 2019-10-31 – 2019-11-05 (×19): 2 via RESPIRATORY_TRACT
  Filled 2019-10-30: qty 6.7

## 2019-10-30 MED ORDER — SODIUM CHLORIDE 0.9 % IV SOLN
2.0000 g | INTRAVENOUS | Status: AC
  Administered 2019-10-31 – 2019-11-04 (×5): 2 g via INTRAVENOUS
  Filled 2019-10-30 (×6): qty 20

## 2019-10-30 MED ORDER — ONDANSETRON HCL 4 MG/2ML IJ SOLN
4.0000 mg | Freq: Four times a day (QID) | INTRAMUSCULAR | Status: DC | PRN
  Administered 2019-11-04: 4 mg via INTRAVENOUS
  Filled 2019-10-30: qty 2

## 2019-10-30 MED ORDER — FAMOTIDINE 20 MG PO TABS
20.0000 mg | ORAL_TABLET | Freq: Every day | ORAL | Status: DC
  Administered 2019-10-30 – 2019-11-05 (×7): 20 mg via ORAL
  Filled 2019-10-30 (×7): qty 1

## 2019-10-30 MED ORDER — HYDROCODONE-ACETAMINOPHEN 5-325 MG PO TABS: 1.0000 | ORAL_TABLET | ORAL | Status: DC | PRN

## 2019-10-30 MED ORDER — ENOXAPARIN SODIUM 40 MG/0.4ML ~~LOC~~ SOLN: 40.0000 mg | SUBCUTANEOUS | Status: DC

## 2019-10-30 NOTE — Progress Notes (Signed)
Pharmacy Note - Remdesivir Dosing  O:  ALT: 39 CXR: bilateral patchy, ground glass infiltrates consistent w/ multi-focal PNA Requiring supplemental O2: 89% on 4L per [place]   A/P:  Patient meets criteria for remdesivir.  Patient received  remdesivir 200 mg IV x 1 at [facility] on 12/17 at 1328  Start remdesivir 100 mg IV daily x 4 days on 12/18 Monitor ALT, clinical progress  Despina Pole, Pharm. D. Clinical Pharmacist 10/30/2019 11:11 PM

## 2019-10-30 NOTE — H&P (Addendum)
History and Physical    Brandon Gallagher EHO:[id number] DOB: 1961-06-03 DOA: 10/30/2019  PCP: Nicoletta Dress, MD   Patient coming from: Home, by way of [facility] ED   Chief Complaint: Cough, SOB, aches, chills, loose stools   HPI: Brandon Gallagher is a 58 y.o. male with medical history significant for IBS, IgA deficiency, recurrent diverticulitis, anxiety, and GERD, presenting to the [facility] emergency department on 10/30/2019 with approximately 12 days of aches, chills, congestion, loose stools, and worsening cough and shortness of breath.  Patient tested positive for the coronavirus on 10/19/2019 (at [place], visible in [place]), was having only mild respiratory symptoms at that time but experiencing abdominal pain similar to his prior experiences with diverticulitis.  He just completed a course of antibiotics with Levaquin and another agent that he does not recall the name of.  The abdominal pain has subsided, nonbloody diarrhea persists, and his main concern now is progression in his shortness of breath.  ED Course: Upon arrival to the ED, patient is found to be bradycardic in the 40s, hypoxic with new 4 L/min supplemental oxygen requirement, and with stable blood pressure.  EKG features sinus bradycardia with rate 46.  Procalcitonin was modestly elevated.  Troponin was undetectable.  CTA chest is negative for PE but concerning for severe bilateral patchy infiltrates.  Patient was started on remdesivir and Decadron, and arrangements were made for transfer to [place] for ongoing evaluation and management.  Review of Systems:  All other systems reviewed and apart from HPI, are negative.  Past Medical History:  Diagnosis Date   Allergy    Anxiety    Arthritis    Asthma    Blood transfusion without reported diagnosis    Bronchitis    last had back in 2017   Sees no pulmonary md   COPD (chronic obstructive pulmonary disease) ([place])     Depression    Diverticulosis    last flare up was in Jan. 2018  Was on course of antibiotics for this   Esophagus disorder    narrowing of esophagus.  Had to have dilatation 09/2016   Family history of adverse reaction to anesthesia    son had severe N/V with his knee surgery   GERD (gastroesophageal reflux disease)    Headache    History of Clostridioides difficile colitis    History of colon polyps    IBS (irritable bowel syndrome)    Sleep apnea    no CPAP- unsure if true diagnosis or not    Past Surgical History:  Procedure Laterality Date   ANTERIOR CERVICAL DECOMP/DISCECTOMY FUSION N/A 01/01/2017   Procedure: Cervical four- five and Cervical six- seven Anterior cervical discectomy with fusion and plate fixation;  Surgeon: Kevan Ny Ditty, MD;  Location: [place];  Service: Neurosurgery;  Laterality: N/A;  C4-5 and C6-7 Anterior cervical discectomy with fusion and plate fixation   BACK SURGERY     x 2    @ [facility]        Last one 09/2015   CARDIAC CATHETERIZATION     He thinks.   Not really sure where at, thought it was here @ [facility], but its been a LONG time ?2000   COLONOSCOPY  10/22/2017   Colonic polyp status post polypectomy. Moderate predominantly sigmoid diverticulosis.    COLONOSCOPY  2019   Dr Orlena Sheldon   ESOPHAGOGASTRODUODENOSCOPY  06/08/2016   Distal esophageal stricture status post esophageal dilatation. Presbtesophagus, also suggestive of  motility disorder. Mild gastritis.    NASAL SINUS SURGERY     HAS HAD 2 OR 3.   LAST ONE 2011, IN [place]  (LEFT NASAL PASSAGE IS NARROWER THAN RIGHT   UPPER GASTROINTESTINAL ENDOSCOPY       reports that he quit smoking about 19 years ago. His smoking use included cigarettes. He has a 30.00 pack-year smoking history. He has never used smokeless tobacco. He reports previous drug use. He reports that he does not drink alcohol.  Allergies  Allergen Reactions   Iodinated Diagnostic Agents     Other  reaction(s): NO ALLERGY   Latex     Other reaction(s): NO ALLERGY    Family History  Problem Relation Age of Onset   Heart disease Mother    Stroke Father    Colon cancer Neg Hx    Esophageal cancer Neg Hx    Stomach cancer Neg Hx    Diabetes Neg Hx    Rectal cancer Neg Hx      Prior to Admission medications   Medication Sig Start Date End Date Taking? Authorizing Provider  albuterol (VENTOLIN HFA) 108 (90 Base) MCG/ACT inhaler Can inhale two puffs every four to six hours as needed for cough or wheeze. 10/01/19   Kozlow, Donnamarie Poag, MD  ALPRAZolam Duanne Moron) 1 MG tablet Take 1 mg by mouth 2 (two) times daily as needed for anxiety.  07/24/15   [provider]  ammonium lactate (LAC-HYDRIN) 12 % lotion 2 application daily.  05/26/19   [provider]  famotidine (PEPCID) 40 MG tablet Take 20 mg by mouth 2 (two) times daily. 09/18/19   [provider]  fluticasone (CUTIVATE) 0.05 % cream Apply topically 2 (two) times daily. to rectum. For 10 days 10/02/19   Jackquline Denmark, MD  montelukast (SINGULAIR) 10 MG tablet Take 1 tablet (10 mg total) by mouth at bedtime. 07/03/19   Kozlow, Donnamarie Poag, MD  omeprazole (PRILOSEC) 20 MG capsule Take 20 mg by mouth 2 (two) times daily. 11/28/16   [provider]  SYMBICORT 160-4.5 MCG/ACT inhaler Inhale 2 puffs into the lungs 1-2 times daily 07/03/19   Kozlow, Donnamarie Poag, MD  Triamcinolone Acetonide (NASACORT AQ NA) Place 1 spray into the nose 2 (two) times daily.    [provider]    Physical Exam: Vitals:   10/30/19 2030  Temp: 98.5 F (36.9 C)  TempSrc: Axillary  Weight: 115.2 kg  Height: 5\' 11"  (1.803 m)    Constitutional: NAD, calm  Eyes: PERTLA, lids and conjunctivae normal ENMT: Mucous membranes are moist. Posterior pharynx clear of any exudate or lesions.   Neck: normal, supple, no masses, no thyromegaly Respiratory: Mild tachypnea, speaking full sentences. No pallor or cyanosis.   Cardiovascular:  Rate ~45 and regular. No extremity edema.   Abdomen: No distension, mild generalized tenderness, soft, no rebound pain or guarding. Bowel sounds active.  Musculoskeletal: no clubbing / cyanosis. No joint deformity upper and lower extremities.   Skin: no significant rashes, lesions, ulcers. Warm, dry, well-perfused. Neurologic: No facial asymmetry. Sensation intact. Moving all extremities.  Psychiatric: Alert and oriented to person, place, and situation. Pleasant, cooperative.      Labs on Admission: I have personally reviewed following labs and imaging studies  CBC: No results for input(s): WBC, NEUTROABS, HGB, HCT, MCV, PLT in the last 168 hours. Basic Metabolic Panel: No results for input(s): NA, K, CL, CO2, GLUCOSE, BUN, CREATININE, CALCIUM, MG, PHOS in the last 168 hours. GFR: CrCl  cannot be calculated (Patient's most recent lab result is older than the maximum 21 days allowed.). Liver Function Tests: No results for input(s): AST, ALT, ALKPHOS, BILITOT, PROT, ALBUMIN in the last 168 hours. No results for input(s): LIPASE, AMYLASE in the last 168 hours. No results for input(s): AMMONIA in the last 168 hours. Coagulation Profile: No results for input(s): INR, PROTIME in the last 168 hours. Cardiac Enzymes: No results for input(s): CKTOTAL, CKMB, CKMBINDEX, TROPONINI in the last 168 hours. BNP (last 3 results) No results for input(s): PROBNP in the last 8760 hours. HbA1C: No results for input(s): HGBA1C in the last 72 hours. CBG: No results for input(s): GLUCAP in the last 168 hours. Lipid Profile: No results for input(s): CHOL, HDL, LDLCALC, TRIG, CHOLHDL, LDLDIRECT in the last 72 hours. Thyroid Function Tests: No results for input(s): TSH, T4TOTAL, FREET4, T3FREE, THYROIDAB in the last 72 hours. Anemia Panel: No results for input(s): VITAMINB12, FOLATE, FERRITIN, TIBC, IRON, RETICCTPCT in the last 72 hours. Urine analysis: No results found for: COLORURINE, APPEARANCEUR,  LABSPEC, PHURINE, GLUCOSEU, HGBUR, BILIRUBINUR, KETONESUR, PROTEINUR, UROBILINOGEN, NITRITE, LEUKOCYTESUR Sepsis Labs: @LABRCNTIP (procalcitonin:4,lacticidven:4) )No results found for this or any previous visit (from the past 240 hour(s)).   Radiological Exams on Admission: No results found.  EKG: Independently reviewed. Sinus bradycardia, rate 46.   Assessment/Plan  1. COVID-19 pneumonia; acute hypoxic respiratory failure  - Pt developed aches, chills, and loose stools ~12/5, diagnosed with COVID on 12/6, now presenting with progressive cough and SOB, and is found to have new 4 Lpm supplemental O2 requirement, severe patchy opacities bilaterally on chest imaging, and modest elevation in procalcitonin  - He was started on Decadron, remdesivir, and supplemental O2 at [place]  - Check sputum culture, check strep pneumo and legionella antigens, trend procalcitonin, start Rocephin and azithromycin, continue Decadron and remdesivir, and continue supplemental O2 as needed    2. Sinus bradycardia  - Rate in 40's on arrival to [facility], asymptomatic  - EKG from [place] reviewed, SB with rate 46  - Troponin was undetectable and no acute ischemic features on EKG or anginal complaints to suggest an ischemic etiology  - Does not appear to be on any of the commonly implicated medications  - Could be related to the viral infection, also case reports suggesting could be from remdesivir  - Will continue cardiac monitoring, check TSH   3. IgA deficiency  - Follows with allergist and managed with Singulair, nasal sprays, and inhalers, will continue   4. GERD - Continue PPI and H2-blocker     DVT prophylaxis: Lovenox  Code Status: Full  Family Communication: Discussed with patient  Consults called: None  Admission status: Inpatient     Vianne Bulls, MD [facility] Pager [phone]  If 7PM-7AM, please contact night-coverage [email or web address] Password [facility]  10/30/2019, 10:34 PM

## 2019-10-31 ENCOUNTER — Encounter (HOSPITAL_COMMUNITY): Payer: Self-pay | Admitting: Family Medicine

## 2019-10-31 ENCOUNTER — Other Ambulatory Visit: Payer: Self-pay

## 2019-10-31 DIAGNOSIS — U071 COVID-19: Principal | ICD-10-CM

## 2019-10-31 DIAGNOSIS — J069 Acute upper respiratory infection, unspecified: Secondary | ICD-10-CM

## 2019-10-31 DIAGNOSIS — D802 Selective deficiency of immunoglobulin A [IgA]: Secondary | ICD-10-CM

## 2019-10-31 DIAGNOSIS — K219 Gastro-esophageal reflux disease without esophagitis: Secondary | ICD-10-CM

## 2019-10-31 LAB — GLUCOSE, CAPILLARY
Glucose-Capillary: 142 mg/dL — ABNORMAL HIGH (ref 70–99)
Glucose-Capillary: 179 mg/dL — ABNORMAL HIGH (ref 70–99)
Glucose-Capillary: 209 mg/dL — ABNORMAL HIGH (ref 70–99)
Glucose-Capillary: 214 mg/dL — ABNORMAL HIGH (ref 70–99)

## 2019-10-31 LAB — FIBRINOGEN: Fibrinogen: 759 mg/dL — ABNORMAL HIGH (ref 210–475)

## 2019-10-31 LAB — T4, FREE: Free T4: 1.16 ng/dL — ABNORMAL HIGH (ref 0.61–1.12)

## 2019-10-31 LAB — D-DIMER, QUANTITATIVE: D-Dimer, Quant: 1.59 ug/mL-FEU — ABNORMAL HIGH (ref 0.00–0.50)

## 2019-10-31 LAB — HIV ANTIBODY (ROUTINE TESTING W REFLEX): HIV Screen 4th Generation wRfx: NONREACTIVE

## 2019-10-31 LAB — ABO/RH: ABO/RH(D): O NEG

## 2019-10-31 LAB — HEPATITIS B SURFACE ANTIGEN: Hepatitis B Surface Ag: NONREACTIVE

## 2019-10-31 LAB — STREP PNEUMONIAE URINARY ANTIGEN: Strep Pneumo Urinary Antigen: NEGATIVE

## 2019-10-31 LAB — PROCALCITONIN: Procalcitonin: 0.12 ng/mL

## 2019-10-31 MED ORDER — INSULIN ASPART 100 UNIT/ML ~~LOC~~ SOLN
0.0000 [IU] | Freq: Three times a day (TID) | SUBCUTANEOUS | Status: DC
  Administered 2019-10-31: 3 [IU] via SUBCUTANEOUS
  Administered 2019-10-31: 1 [IU] via SUBCUTANEOUS
  Administered 2019-10-31 – 2019-11-01 (×2): 2 [IU] via SUBCUTANEOUS
  Administered 2019-11-01: 1 [IU] via SUBCUTANEOUS
  Administered 2019-11-01: 3 [IU] via SUBCUTANEOUS
  Administered 2019-11-02: 1 [IU] via SUBCUTANEOUS
  Administered 2019-11-03: 2 [IU] via SUBCUTANEOUS
  Administered 2019-11-03: 1 [IU] via SUBCUTANEOUS
  Administered 2019-11-04: 2 [IU] via SUBCUTANEOUS
  Administered 2019-11-05: 1 [IU] via SUBCUTANEOUS

## 2019-10-31 MED ORDER — METRONIDAZOLE IN NACL 5-0.79 MG/ML-% IV SOLN
500.0000 mg | Freq: Three times a day (TID) | INTRAVENOUS | Status: DC
  Administered 2019-10-31 – 2019-11-04 (×12): 500 mg via INTRAVENOUS
  Filled 2019-10-31 (×14): qty 100

## 2019-10-31 MED ORDER — HYDROCORTISONE ACETATE 25 MG RE SUPP
25.0000 mg | Freq: Two times a day (BID) | RECTAL | Status: DC
  Administered 2019-11-01: 25 mg via RECTAL
  Filled 2019-10-31 (×5): qty 1

## 2019-10-31 MED ORDER — FUROSEMIDE 10 MG/ML IJ SOLN
40.0000 mg | Freq: Every day | INTRAMUSCULAR | Status: DC
  Administered 2019-10-31 – 2019-11-02 (×3): 40 mg via INTRAVENOUS
  Filled 2019-10-31 (×3): qty 4

## 2019-10-31 MED ORDER — METHYLPREDNISOLONE SODIUM SUCC 125 MG IJ SOLR
60.0000 mg | Freq: Two times a day (BID) | INTRAMUSCULAR | Status: DC
  Administered 2019-10-31 – 2019-11-02 (×5): 60 mg via INTRAVENOUS
  Filled 2019-10-31 (×5): qty 2

## 2019-10-31 NOTE — Progress Notes (Signed)
Received report from transferring facility Brandon Gallagher), nurse report given by [facility].  Patient new admit documentation completed as well as the two person skin check.  Patient VS taken, placed on telemetry, cares addressed.  Hospitalist informed of arrival.

## 2019-10-31 NOTE — Evaluation (Signed)
Physical Therapy Evaluation Patient Details Name: Brandon Gallagher MRN: [id number] DOB: [birth year] Today's Date: 10/31/2019   History of Present Illness  Patient is a 58 y.o. male with PMHx of IBS, IgA deficiency, recurrent diverticulitis , anxiety, GERD who had tested positive  for Covid on 12/6 .presented to [facility] ED on 12/17 with fever, chills, cough and worsening shortness of breath, abdominal pain, had a CT scan of the abdomen on 12/14 . IN ED found to have hypoxia, sinus bradycardia.  Clinical Impression  The patient  Ambulated x 50' on 4 L [place](finger) with SPO2 88%. Patient  Saturation at rest on 4 L 93%. Will continue to monitor SPO2  While ambulating. Patient had been home on a little O2 but tank ran out per pt. Pt admitted with above diagnosis.  Pt currently with functional limitations due to the deficits listed below (see PT Problem List). Pt will benefit from skilled PT to increase their independence and safety with mobility to allow discharge to the venue listed below.       Follow Up Recommendations No PT follow up    Equipment Recommendations  (TBD? SPC)    Recommendations for Other Services   OT    Precautions / Restrictions Precautions Precautions: Fall Precaution Comments: sats, needs his shoes, neuropathy, has hemorrhoids so can't sit long      Mobility  Bed Mobility Overal bed mobility: Independent                Transfers Overall transfer level: Independent                  Ambulation/Gait Ambulation/Gait assistance: Min guard;Supervision Gait Distance (Feet): 50 Feet Assistive device: None Gait Pattern/deviations: Step-through pattern;Decreased stride length Gait velocity: decr   General Gait Details: tending to hold onto objects to steady, did not wear his shoes.  Stairs            Wheelchair Mobility    Modified Rankin (Stroke Patients Only)       Balance Overall balance assessment: No apparent balance deficits (not  formally assessed)                                           Pertinent Vitals/Pain Pain Assessment: Faces Faces Pain Scale: Hurts little more Pain Location: abdomen and hemorrhoids Pain Descriptors / Indicators: Discomfort Pain Intervention(s): Monitored during session;Repositioned    Home Living Family/patient expects to be discharged to:: Private residence Living Arrangements: Children Available Help at Discharge: Family Type of Home: House Home Access: Stairs to enter Entrance Stairs-Rails: None Technical brewer of Steps: 2 Home Layout: One level Home Equipment: None      Prior Function Level of Independence: Independent         Comments: drives, disabled     Hand Dominance   Dominant Hand: Right    Extremity/Trunk Assessment   Upper Extremity Assessment Upper Extremity Assessment: Generalized weakness(reports mild UE neuropathy.)    Lower Extremity Assessment Lower Extremity Assessment: Generalized weakness;RLE deficits/detail;LLE deficits/detail RLE Deficits / Details: deformity of foot- wears shoes that are better for ambualtion RLE Sensation: history of peripheral neuropathy LLE Deficits / Details: same as right LLE Sensation: history of peripheral neuropathy    Cervical / Trunk Assessment Cervical / Trunk Assessment: Normal(slight forward head)  Communication   Communication: No difficulties  Cognition Arousal/Alertness: Awake/alert Behavior During Therapy: WFL for tasks assessed/performed;Anxious  Overall Cognitive Status: Within Functional Limits for tasks assessed                                 General Comments: Expresses concern for current personal challenges of wife +, M-I law has dementia, trying to mange this from hospital.      General Comments General comments (skin integrity, edema, etc.): reports no falls    Exercises     Assessment/Plan    PT Assessment Patient needs continued PT services   PT Problem List Decreased strength;Decreased mobility;Decreased safety awareness;Decreased activity tolerance;Cardiopulmonary status limiting activity;Decreased balance;Decreased knowledge of use of DME       PT Treatment Interventions DME instruction;Therapeutic activities;Gait training;Therapeutic exercise;Functional mobility training;Patient/family education    PT Goals (Current goals can be found in the Care Plan section)  Acute Rehab PT Goals Patient Stated Goal: to get back home PT Goal Formulation: With patient Time For Goal Achievement: 11/14/19 Potential to Achieve Goals: Good    Frequency Min 3X/week   Barriers to discharge        Co-evaluation               AM-PAC PT "6 Clicks" Mobility  Outcome Measure Help needed turning from your back to your side while in a flat bed without using bedrails?: None Help needed moving from lying on your back to sitting on the side of a flat bed without using bedrails?: None Help needed moving to and from a bed to a chair (including a wheelchair)?: A Little Help needed standing up from a chair using your arms (e.g., wheelchair or bedside chair)?: A Little Help needed to walk in hospital room?: A Little Help needed climbing 3-5 steps with a railing? : A Little 6 Click Score: 20    End of Session Equipment Utilized During Treatment: Oxygen Activity Tolerance: Patient limited by fatigue Patient left: in chair;with call bell/phone within reach Nurse Communication: Mobility status PT Visit Diagnosis: Unsteadiness on feet (R26.81);Difficulty in walking, not elsewhere classified (R26.2)    Time: 1029-1101 PT Time Calculation (min) (ACUTE ONLY): 32 min   Charges:   PT Evaluation $PT Eval Moderate Complexity: 1 Mod PT Treatments $Gait Training: 8-22 mins        Tresa Endo PT Acute Rehabilitation Services Pager [phone] Office [phone]   Claretha Cooper 10/31/2019, 12:05 PM

## 2019-10-31 NOTE — Progress Notes (Addendum)
1900 Report from overnight RN  2030 Rounds on pt, he stated he will update his own family   2130 PM medications given, Pt on 5 L [place].  NSR/SB on monitor. Pt uses the urinal. Call bell in reach, Bed alarmed.  1030 IV leaking, removed and new one applied.   [place] on patient, vital signs WNL  1400 Rounds on patient , pt asleep in the bed.  1630 Rounds on patient, vital signs WNL. Pt pulled out IV in sleep.   1715 Pt washed up , new linen and gown. New IV placed.  0712 Report given to oncoming RN

## 2019-10-31 NOTE — Progress Notes (Signed)
PROGRESS NOTE                                                                                                                                                                                                             Patient Demographics:    Brandon Gallagher, is a 58 y.o. male, DOB - 20-Sep-1961, [id number]  Outpatient Primary MD for the patient is Nicoletta Dress, MD   Admit date - 10/30/2019   LOS - 1  No chief complaint on file.      Brief Narrative: Patient is a 59 y.o. male with PMHx of IBS, IgA deficiency, recurrent diverticulitis (claims he just finished a course of antimicrobial therapy approximately 2 weeks back), anxiety, GERD who had tested positive  for Covid on 12/6 (at [facility] of the [facility] care everywhere).presented to [facility] ED on 12/17 with fever, chills, cough and worsening shortness of breath.  He claims that he is started having a recurrent left lower quadrant abdominal pain that is very reminiscent of his diverticulitis on 12/14-and had a CT scan of the abdomen on 12/14 at [facility] that did not show any active diverticulitis. He was found to have acute hypoxic respiratory failure along with sinus bradycardia-subsequently transferred to [facility] for further evaluation and treatment.  Significant events: 12/6>> Covid positive (at [place] of the [facility] care everywhere) 12/14>> recurrent left lower quadrant pain-concern for diverticulitis-negative CT abd [facility] 12/17>> presented at [facility] ED with shortness of breath and cough 1217>> CT angio chest at [facility]-negative for PE, but severe bilateral patchy/groundglass infiltrates.   Subjective:    Brandon Gallagher today feels essentially unchanged-anywhere from 3 to 5 L of oxygen.  He seems comfortable.    Assessment  & Plan :   Acute Hypoxic Resp Failure due to Covid 19 Viral pneumonia and concurrent bacterial  pneumonia (per H&P procalcitonin elevated at [facility]): Remains stable-requiring anywhere from 3 to 5 L of oxygen.  Continue steroids and remdesivir with empiric Rocephin/Zithromax.  Did contemplate Actemra infusion-however patient has a history of recurrent diverticulitis-and just finished a antimicrobial course 10-14 days back.  He now has started developing left lower quadrant pain since this past Monday-which per patient is very reminiscent of his usual diverticulitis.  Although he does not have any diverticulitis on CT on 12/14-given tenderness present on exam-I am  holding off on Actemra-given risk of bowel perforation.  Fever: afebrile  O2 requirements:  SpO2: 94 % O2 Flow Rate (L/min): 5 L/min   COVID-19 Labs: Recent Labs    10/30/19 2155  DDIMER 1.59*  LDH 279*  CRP 5.0*       Component Value Date/Time   BNP 255.5 (H) 10/30/2019 2155    Recent Labs  Lab 10/30/19 2155  PROCALCITON 0.12    No results found for: SARSCOV2NAA   COVID-19 Medications: Steroids: 12/17>> Remdesivir: 12/18>>  Other medications: Diuretics:Euvolemic-however start IV Lasix daily to maintain negative balance. Antibiotics: Rocephin: 12/17>> Zithromax: 12/17>> Insulin: Monitor CBGs closely on SSI while on steroids.  A1c pending.  Prone/Incentive Spirometry: encouraged patient to lie prone for 3-4 hours at a time for a total of 16 hours a day, and to encourage incentive spirometry use 3-4/hour.  DVT Prophylaxis  :  Lovenox   Sinus bradycardia: Asymptomatic-check TSH-?  From OSA.  Ambulate with nursing staff to see chronotropic response.  Recurrent diverticulitis: He has started having LLQ pain since this past Monday-although CT abdomen at [facility] was negative for diverticulitis-given that he has tenderness reproducible on exam (although belly is very soft)-we will add Flagyl to his antibiotic regimen.  Per patient-he follows with Dr. Etter Sjogren is advised surgery/colectomy  given the recurrent nature of his diverticulitis.  Rocephin: 12/17>> Flagyl: 12/18>>  GERD: Continue PPI/H2 blocker  IgA deficiency: Follows with allergist-reviewed outpatient notes-looks like patient was not felt to require immunoglobulin infusion-due to significant antibody response to the Pneumovax vaccine.  He does have a history of recurrent sinus and pulmonary infections.  Obesity: Estimated body mass index is 35.43 kg/m as calculated from the following:   Height as of this encounter: 5\' 11"  (1.803 m).   Weight as of this encounter: 115.2 kg.    Consults  :  None  Procedures  :  None  ABG: No results found for: PHART, PCO2ART, PO2ART, HCO3, TCO2, ACIDBASEDEF, O2SAT  Vent Settings: N/A  Condition - Extremely Guarded  Family Communication  :  Spouse updated over the phone  Code Status :  Full Code  Diet :  Diet Order            Diet Heart Room service appropriate? Yes; Fluid consistency: Thin  Diet effective now              Disposition Plan  :  Remain hospitalized  Barriers to discharge: Hypoxia requiring O2 supplementation/complete 5 days of IV Remdesivir  Antimicorbials  :    Anti-infectives (From admission, onward)   Start     Dose/Rate Route Frequency Ordered Stop   10/31/19 1100  azithromycin (ZITHROMAX) 500 mg in sodium chloride 0.9 % 250 mL IVPB     500 mg 250 mL/hr over 60 Minutes Intravenous Every 24 hours 10/30/19 2242 11/05/19 1059   10/31/19 1000  cefTRIAXone (ROCEPHIN) 2 g in sodium chloride 0.9 % 100 mL IVPB     2 g 200 mL/hr over 30 Minutes Intravenous Every 24 hours 10/30/19 2242 11/05/19 0959      Inpatient Medications  Scheduled Meds: . albuterol  2 puff Inhalation Q6H  . dexamethasone (DECADRON) injection  6 mg Intravenous Q24H  . enoxaparin (LOVENOX) injection  60 mg Subcutaneous Q24H  . famotidine  20 mg Oral Daily  . fluticasone  1 spray Each Nare Daily  . fluticasone furoate-vilanterol  1 puff Inhalation Daily  .  montelukast  10 mg Oral QHS  . pantoprazole  40  mg Oral Daily  . sodium chloride flush  3 mL Intravenous Q12H   Continuous Infusions: . sodium chloride    . azithromycin    . cefTRIAXone (ROCEPHIN)  IV     PRN Meds:.sodium chloride, acetaminophen, ALPRAZolam, HYDROcodone-acetaminophen, ondansetron **OR** ondansetron (ZOFRAN) IV, sodium chloride flush   Time Spent in minutes  35   See all Orders from today for further details  Oren Binet M.D on 10/31/2019 at 7:08 AM  To page go to [email or web address] - use universal password  [facility] -  Office  [phone]    Objective:   Vitals:   10/31/19 0025 10/31/19 0027 10/31/19 0516 10/31/19 0522  BP:  115/66 112/71 112/71  Pulse:  (!) 46  65  Resp:  18  20  Temp: 98.1 F (36.7 C) 98.1 F (36.7 C) 98.5 F (36.9 C) 98.5 F (36.9 C)  TempSrc:  Oral Oral Oral  SpO2:  93% 92% 94%  Weight:      Height:        Wt Readings from Last 3 Encounters:  10/30/19 115.2 kg  08/07/19 116.1 kg  07/10/19 116.1 kg     Intake/Output Summary (Last 24 hours) at 10/31/2019 0708 Last data filed at 10/31/2019 0100 Gross per 24 hour  Intake 0 ml  Output --  Net 0 ml     Physical Exam Gen Exam:Alert awake-not in any distress HEENT:atraumatic, normocephalic Chest: B/L clear to auscultation anteriorly-fine rales bilaterally CVS:S1S2 regular Abdomen:soft-mild LLQ tenderness without any rebound, non distended Extremities:no edema Neurology: Non focal Skin: no rash   Data Review:    CBC Recent Labs  Lab 10/30/19 2155  WBC 9.5  HGB 14.6  HCT 43.1  PLT 251  MCV 91.1  MCH 30.9  MCHC 33.9  RDW 12.6  LYMPHSABS 0.6*  MONOABS 0.3  EOSABS 0.0  BASOSABS 0.0    Chemistries  Recent Labs  Lab 10/30/19 2155  NA 140  K 4.5  CL 108  CO2 22  GLUCOSE 166*  BUN 18  CREATININE 0.90  CALCIUM 8.8*  AST 40  ALT 36  ALKPHOS 58  BILITOT 1.0    ------------------------------------------------------------------------------------------------------------------ No results for input(s): CHOL, HDL, LDLCALC, TRIG, CHOLHDL, LDLDIRECT in the last 72 hours.  No results found for: HGBA1C ------------------------------------------------------------------------------------------------------------------ Recent Labs    10/30/19 2240  TSH 0.308*   ------------------------------------------------------------------------------------------------------------------ No results for input(s): VITAMINB12, FOLATE, FERRITIN, TIBC, IRON, RETICCTPCT in the last 72 hours.  Coagulation profile No results for input(s): INR, PROTIME in the last 168 hours.  Recent Labs    10/30/19 2155  DDIMER 1.59*    Cardiac Enzymes No results for input(s): CKMB, TROPONINI, MYOGLOBIN in the last 168 hours.  Invalid input(s): CK ------------------------------------------------------------------------------------------------------------------    Component Value Date/Time   BNP 255.5 (H) 10/30/2019 2155    Micro Results No results found for this or any previous visit (from the past 240 hour(s)).  Radiology Reports No results found.

## 2019-10-31 NOTE — Plan of Care (Signed)
Discussed appropriate handwashing

## 2019-11-01 LAB — C-REACTIVE PROTEIN: CRP: 2.6 mg/dL — ABNORMAL HIGH (ref <1.0)

## 2019-11-01 LAB — CBC WITH DIFFERENTIAL/PLATELET
Abs Immature Granulocytes: 0.47 10*3/uL — ABNORMAL HIGH (ref 0.00–0.07)
Basophils Absolute: 0.1 10*3/uL (ref 0.0–0.1)
Basophils Relative: 1 %
Eosinophils Absolute: 0 10*3/uL (ref 0.0–0.5)
Eosinophils Relative: 0 %
HCT: 42.3 % (ref 39.0–52.0)
Hemoglobin: 14.4 g/dL (ref 13.0–17.0)
Immature Granulocytes: 4 %
Lymphocytes Relative: 7 %
Lymphs Abs: 0.9 10*3/uL (ref 0.7–4.0)
MCH: 30.6 pg (ref 26.0–34.0)
MCHC: 34 g/dL (ref 30.0–36.0)
MCV: 90 fL (ref 80.0–100.0)
Monocytes Absolute: 0.7 10*3/uL (ref 0.1–1.0)
Monocytes Relative: 5 %
Neutro Abs: 10.3 10*3/uL — ABNORMAL HIGH (ref 1.7–7.7)
Neutrophils Relative %: 83 %
Platelets: 302 10*3/uL (ref 150–400)
RBC: 4.7 MIL/uL (ref 4.22–5.81)
RDW: 12.4 % (ref 11.5–15.5)
WBC: 12.4 10*3/uL — ABNORMAL HIGH (ref 4.0–10.5)
nRBC: 0 % (ref 0.0–0.2)

## 2019-11-01 LAB — COMPREHENSIVE METABOLIC PANEL
ALT: 32 U/L (ref 0–44)
AST: 26 U/L (ref 15–41)
Albumin: 2.6 g/dL — ABNORMAL LOW (ref 3.5–5.0)
Alkaline Phosphatase: 54 U/L (ref 38–126)
Anion gap: 10 (ref 5–15)
BUN: 22 mg/dL — ABNORMAL HIGH (ref 6–20)
CO2: 26 mmol/L (ref 22–32)
Calcium: 8.7 mg/dL — ABNORMAL LOW (ref 8.9–10.3)
Chloride: 102 mmol/L (ref 98–111)
Creatinine, Ser: 0.95 mg/dL (ref 0.61–1.24)
GFR calc Af Amer: >60 mL/min (ref >60)
GFR calc non Af Amer: >60 mL/min (ref >60)
Glucose, Bld: 179 mg/dL — ABNORMAL HIGH (ref 70–99)
Potassium: 4 mmol/L (ref 3.5–5.1)
Sodium: 138 mmol/L (ref 135–145)
Total Bilirubin: 0.9 mg/dL (ref 0.3–1.2)
Total Protein: 5.9 g/dL — ABNORMAL LOW (ref 6.5–8.1)

## 2019-11-01 LAB — HEMOGLOBIN A1C
Hgb A1c MFr Bld: 6.6 % — ABNORMAL HIGH (ref 4.8–5.6)
Mean Plasma Glucose: 142.72 mg/dL

## 2019-11-01 LAB — GLUCOSE, CAPILLARY
Glucose-Capillary: 131 mg/dL — ABNORMAL HIGH (ref 70–99)
Glucose-Capillary: 245 mg/dL — ABNORMAL HIGH (ref 70–99)
Glucose-Capillary: 193 mg/dL — ABNORMAL HIGH (ref 70–99)

## 2019-11-01 LAB — MAGNESIUM: Magnesium: 2.1 mg/dL (ref 1.7–2.4)

## 2019-11-01 LAB — D-DIMER, QUANTITATIVE: D-Dimer, Quant: 1.16 ug/mL-FEU — ABNORMAL HIGH (ref 0.00–0.50)

## 2019-11-01 LAB — FERRITIN: Ferritin: 502 ng/mL — ABNORMAL HIGH (ref 24–336)

## 2019-11-01 LAB — PHOSPHORUS: Phosphorus: 3.4 mg/dL (ref 2.5–4.6)

## 2019-11-01 LAB — PROCALCITONIN: Procalcitonin: <0.1 ng/mL

## 2019-11-01 MED ORDER — SODIUM CHLORIDE 0.9 % IV SOLN
100.0000 mg | Freq: Every day | INTRAVENOUS | Status: AC
  Administered 2019-11-01 – 2019-11-04 (×4): 100 mg via INTRAVENOUS
  Filled 2019-11-01 (×4): qty 20

## 2019-11-01 NOTE — Progress Notes (Signed)
PROGRESS NOTE                                                                                                                                                                                                             Patient Demographics:    Brandon Gallagher, is a 58 y.o. male, DOB - 05/02/1961, [id number]  Outpatient Primary MD for the patient is Brandon Dress, MD   Admit date - 10/30/2019   LOS - 2  No chief complaint on file.      Brief Narrative: Patient is a 58 y.o. male with PMHx of IBS, IgA deficiency, recurrent diverticulitis (claims he just finished a course of antimicrobial therapy approximately 2 weeks back), anxiety, GERD who had tested positive  for Covid on 12/6 (at [facility] the [facility] care everywhere).presented to [facility] ED on 12/17 with fever, chills, cough and worsening shortness of breath.  He was found to have acute hypoxic respiratory failure along with sinus bradycardia-subsequently transferred to [facility] for further evaluation and treatment.   He claims that he is started having  recurrent left lower quadrant abdominal pain that is very reminiscent of his diverticulitis on 12/14-and had a CT scan of the abdomen on 12/14 at [facility] that did not show any active diverticulitis.   Significant events: 12/6>> Covid positive (at [place] of the [facility] care everywhere) 12/14>> recurrent left lower quadrant pain-concern for diverticulitis-negative CT abd [facility] 12/17>> presented at [facility] ED with shortness of breath and cough 1217>> CT angio chest at [facility]-negative for PE, but severe bilateral patchy/groundglass infiltrates.   Subjective:    Brandon Gallagher feels that his shortness of breath is since may be a bit better.  He thinks he has less pain in his left lower quadrant of his abdomen-but on palpation-he continues to have tenderness.   Assessment  & Plan :    Acute Hypoxic Resp Failure due to Covid 19 Viral pneumonia and concurrent bacterial pneumonia (per H&P procalcitonin elevated at [facility]): Remains essentially the same compared to yesterday-requiring around 5 L of oxygen.  Continue steroids/remdesivir and empiric Rocephin-suspect can stop Zithromax after 12/19 dose.    Did contemplate Actemra infusion-however patient has a history of recurrent diverticulitis-and just finished a antimicrobial course 10-14 days back.  He now has started developing left lower quadrant pain since  this past Monday-which per patient is very reminiscent of his usual diverticulitis.  Although he does not have any diverticulitis on CT on 12/14-given tenderness present on exam-I am holding off on Actemra-given risk of bowel perforation.  Fever: afebrile  O2 requirements:  SpO2: 90 % O2 Flow Rate (L/min): 5 L/min   COVID-19 Labs: Recent Labs    10/30/19 2155 11/01/19 0155  DDIMER 1.59* 1.16*  FERRITIN  --  502*  LDH 279*  --   CRP 5.0* 2.6*       Component Value Date/Time   BNP 255.5 (H) 10/30/2019 2155    Recent Labs  Lab 10/30/19 2155 11/01/19 0155  PROCALCITON 0.12 <0.10    No results found for: SARSCOV2NAA   COVID-19 Medications: Steroids: 12/17>> Remdesivir: 12/18>>  Other medications: Diuretics:Euvolemic-continue IV Lasix to maintain negative balance -1.1 L so far) Antibiotics: Rocephin: 12/17>> Zithromax: 12/17>>12/19 Insulin: Monitor CBGs closely on SSI while on steroids.  A1c 6.6-May need initiation of metformin on discharge  Prone/Incentive Spirometry: encouraged patient to lie prone for 3-4 hours at a time for a total of 16 hours a day, and to encourage incentive spirometry use 3-4/hour.  DVT Prophylaxis  :  Lovenox   Sinus bradycardia: Asymptomatic-suspect-from undiagnosed OSA.  Thyroid panel suggestive of mild hyperthyroidism.  Mild hyperthyroidism: Does not seem to have major symptoms-suspect can be monitored.  Avoid  beta-blocker given issues with bradycardia.  Recurrent diverticulitis: He has started having LLQ pain since this past Monday-although CT abdomen at [facility] was negative for diverticulitis-he continues to have LLQ tenderness on exam.  Since LLQ tenderness persists-we will downgrade his diet to full liquid today.  Continue Rocephin/Flagyl.  If he continues to have LLQ pain-he may need repeat imaging at some point.    Per patient-he follows with Brandon Gallagher is advised surgery/colectomy given the recurrent nature of his diverticulitis.  Rocephin: 12/17>> Flagyl: 12/18>>  GERD: Continue PPI/H2 blocker  IgA deficiency: Follows with allergist-reviewed outpatient notes-looks like patient was not felt to require immunoglobulin infusion-due to significant antibody response to the Pneumovax vaccine.  He does have a history of recurrent sinus and pulmonary infections.  Obesity: Estimated body mass index is 35.43 kg/m as calculated from the following:   Height as of this encounter: 5\' 11"  (1.803 m).   Weight as of this encounter: 115.2 kg.    Consults  :  None  Procedures  :  None  ABG: No results found for: PHART, PCO2ART, PO2ART, HCO3, TCO2, ACIDBASEDEF, O2SAT  Vent Settings: N/A  Condition - Extremely Guarded  Family Communication  :  Spouse updated over the phone on 12/19  Code Status :  Full Code  Diet :  Diet Order            Diet full liquid Room service appropriate? Yes; Fluid consistency: Thin  Diet effective now              Disposition Plan  :  Remain hospitalized  Barriers to discharge: Hypoxia requiring O2 supplementation/complete 5 days of IV Remdesivir  Antimicorbials  :    Anti-infectives (From admission, onward)   Start     Dose/Rate Route Frequency Ordered Stop   10/31/19 1100  azithromycin (ZITHROMAX) 500 mg in sodium chloride 0.9 % 250 mL IVPB     500 mg 250 mL/hr over 60 Minutes Intravenous Every 24 hours 10/30/19 2242 11/05/19 1059   10/31/19  1100  metroNIDAZOLE (FLAGYL) IVPB 500 mg     500 mg 100 mL/hr over  60 Minutes Intravenous Every 8 hours 10/31/19 1029     10/31/19 1000  cefTRIAXone (ROCEPHIN) 2 g in sodium chloride 0.9 % 100 mL IVPB     2 g 200 mL/hr over 30 Minutes Intravenous Every 24 hours 10/30/19 2242 11/05/19 0959      Inpatient Medications  Scheduled Meds: . albuterol  2 puff Inhalation Q6H  . enoxaparin (LOVENOX) injection  60 mg Subcutaneous Q24H  . famotidine  20 mg Oral Daily  . fluticasone  1 spray Each Nare Daily  . fluticasone furoate-vilanterol  1 puff Inhalation Daily  . furosemide  40 mg Intravenous Daily  . hydrocortisone  25 mg Rectal BID  . insulin aspart  0-9 Units Subcutaneous TID WC  . methylPREDNISolone (SOLU-MEDROL) injection  60 mg Intravenous BID  . montelukast  10 mg Oral QHS  . pantoprazole  40 mg Oral Daily  . sodium chloride flush  3 mL Intravenous Q12H   Continuous Infusions: . sodium chloride    . azithromycin 500 mg (10/31/19 1247)  . cefTRIAXone (ROCEPHIN)  IV 2 g (11/01/19 0814)  . metronidazole Stopped (11/01/19 0532)   PRN Meds:.sodium chloride, acetaminophen, ALPRAZolam, HYDROcodone-acetaminophen, ondansetron **OR** ondansetron (ZOFRAN) IV, sodium chloride flush   Time Spent in minutes  35   See all Orders from today for further details  Oren Binet M.D on 11/01/2019 at 10:51 AM  To page go to [email or web address] - use universal password  [facility] -  Office  [phone]    Objective:   Vitals:   10/31/19 2033 10/31/19 2305 11/01/19 0427 11/01/19 0738  BP: 124/67 (!) 139/52 120/72 130/75  Pulse: 73  62 (!) 55  Resp: (!) 23 (!) 25 14 (!) 22  Temp: 97.7 F (36.5 C) 98.7 F (37.1 C) 98 F (36.7 C) 98.2 F (36.8 C)  TempSrc: Oral Oral Oral Oral  SpO2: 91%   90%  Weight:      Height:        Wt Readings from Last 3 Encounters:  10/30/19 115.2 kg  08/07/19 116.1 kg  07/10/19 116.1 kg     Intake/Output Summary (Last 24 hours) at 11/01/2019  1051 Last data filed at 11/01/2019 1034 Gross per 24 hour  Intake 1163.51 ml  Output 2300 ml  Net -1136.49 ml     Physical Exam Gen Exam:Alert awake-not in any distress HEENT:atraumatic, normocephalic Chest: B/L clear to auscultation anteriorly-fine rales bilaterally CVS:S1S2 regular Abdomen:soft-mild LLQ tenderness without any rebound, non distended Extremities:no edema Neurology: Non focal Skin: no rash   Data Review:    CBC Recent Labs  Lab 10/30/19 2155 11/01/19 0155  WBC 9.5 12.4*  HGB 14.6 14.4  HCT 43.1 42.3  PLT 251 302  MCV 91.1 90.0  MCH 30.9 30.6  MCHC 33.9 34.0  RDW 12.6 12.4  LYMPHSABS 0.6* 0.9  MONOABS 0.3 0.7  EOSABS 0.0 0.0  BASOSABS 0.0 0.1    Chemistries  Recent Labs  Lab 10/30/19 2155 11/01/19 0155  NA 140 138  K 4.5 4.0  CL 108 102  CO2 22 26  GLUCOSE 166* 179*  BUN 18 22*  CREATININE 0.90 0.95  CALCIUM 8.8* 8.7*  MG  --  2.1  AST 40 26  ALT 36 32  ALKPHOS 58 54  BILITOT 1.0 0.9   ------------------------------------------------------------------------------------------------------------------ No results for input(s): CHOL, HDL, LDLCALC, TRIG, CHOLHDL, LDLDIRECT in the last 72 hours.  Lab Results  Component Value Date   HGBA1C 6.6 (H) 11/01/2019   ------------------------------------------------------------------------------------------------------------------ Recent Labs  10/30/19 2240  TSH 0.308*   ------------------------------------------------------------------------------------------------------------------ Recent Labs    11/01/19 0155  FERRITIN 502*    Coagulation profile No results for input(s): INR, PROTIME in the last 168 hours.  Recent Labs    10/30/19 2155 11/01/19 0155  DDIMER 1.59* 1.16*    Cardiac Enzymes No results for input(s): CKMB, TROPONINI, MYOGLOBIN in the last 168 hours.  Invalid input(s): CK  ------------------------------------------------------------------------------------------------------------------    Component Value Date/Time   BNP 255.5 (H) 10/30/2019 2155    Micro Results No results found for this or any previous visit (from the past 240 hour(s)).  Radiology Reports No results found.

## 2019-11-01 NOTE — Plan of Care (Signed)
Currently on 5 to 6 liters O2 [place] with spo2 88-93%.  SOB with exertion, but calm and resting well in recliner. BM to bedside commode, good po intake. IV lasix given this am with good results. Bilateral breath sounds still diminished. Acupella and IS in use with encouragement. IV abx resumed. Low grade temp of 98.8 po.   Problem: Education: Goal: Knowledge of risk factors and measures for prevention of condition will improve Outcome: Progressing   Problem: Coping: Goal: Psychosocial and spiritual needs will be supported Outcome: Progressing   Problem: Respiratory: Goal: Will maintain a patent airway Outcome: Progressing Goal: Complications related to the disease process, condition or treatment will be avoided or minimized Outcome: Progressing

## 2019-11-01 NOTE — Progress Notes (Addendum)
1900 Report from overnight RN   1920 Rounds on patient, shift assessment completed. 4L [place]. NSR on monitor. Urinal in reach. Call bell in reach. Bed alarmed. Pt stated he calls and updates his own family and no need to call them.  2115 PM medications given, no complaints at this time  2300 Rounds on pt, pt is resting in bed  0115 Rounds on pt, pt is asleep  0310 Rounds on pt, pt repositioned in the bed.   0500 AM medication given.  0700 Report given to oncoming RN

## 2019-11-02 LAB — CBC WITH DIFFERENTIAL/PLATELET
Abs Immature Granulocytes: 0.73 10*3/uL — ABNORMAL HIGH (ref 0.00–0.07)
Basophils Absolute: 0.1 10*3/uL (ref 0.0–0.1)
Basophils Relative: 1 %
Eosinophils Absolute: 0 10*3/uL (ref 0.0–0.5)
Eosinophils Relative: 0 %
HCT: 45.1 % (ref 39.0–52.0)
Hemoglobin: 14.9 g/dL (ref 13.0–17.0)
Immature Granulocytes: 6 %
Lymphocytes Relative: 8 %
Lymphs Abs: 0.9 10*3/uL (ref 0.7–4.0)
MCH: 30.2 pg (ref 26.0–34.0)
MCHC: 33 g/dL (ref 30.0–36.0)
MCV: 91.3 fL (ref 80.0–100.0)
Monocytes Absolute: 0.8 10*3/uL (ref 0.1–1.0)
Monocytes Relative: 7 %
Neutro Abs: 9.3 10*3/uL — ABNORMAL HIGH (ref 1.7–7.7)
Neutrophils Relative %: 78 %
Platelets: 351 10*3/uL (ref 150–400)
RBC: 4.94 MIL/uL (ref 4.22–5.81)
RDW: 12.4 % (ref 11.5–15.5)
WBC: 11.8 10*3/uL — ABNORMAL HIGH (ref 4.0–10.5)
nRBC: 0 % (ref 0.0–0.2)

## 2019-11-02 LAB — COMPREHENSIVE METABOLIC PANEL
ALT: 32 U/L (ref 0–44)
AST: 26 U/L (ref 15–41)
Albumin: 2.7 g/dL — ABNORMAL LOW (ref 3.5–5.0)
Alkaline Phosphatase: 57 U/L (ref 38–126)
Anion gap: 10 (ref 5–15)
BUN: 22 mg/dL — ABNORMAL HIGH (ref 6–20)
CO2: 28 mmol/L (ref 22–32)
Calcium: 9.1 mg/dL (ref 8.9–10.3)
Chloride: 102 mmol/L (ref 98–111)
Creatinine, Ser: 1.01 mg/dL (ref 0.61–1.24)
GFR calc Af Amer: >60 mL/min (ref >60)
GFR calc non Af Amer: >60 mL/min (ref >60)
Glucose, Bld: 116 mg/dL — ABNORMAL HIGH (ref 70–99)
Potassium: 4.6 mmol/L (ref 3.5–5.1)
Sodium: 140 mmol/L (ref 135–145)
Total Bilirubin: 0.8 mg/dL (ref 0.3–1.2)
Total Protein: 6 g/dL — ABNORMAL LOW (ref 6.5–8.1)

## 2019-11-02 LAB — C-REACTIVE PROTEIN: CRP: 1.1 mg/dL — ABNORMAL HIGH (ref <1.0)

## 2019-11-02 LAB — D-DIMER, QUANTITATIVE: D-Dimer, Quant: 1.07 ug/mL-FEU — ABNORMAL HIGH (ref 0.00–0.50)

## 2019-11-02 LAB — GLUCOSE, CAPILLARY
Glucose-Capillary: 110 mg/dL — ABNORMAL HIGH (ref 70–99)
Glucose-Capillary: 137 mg/dL — ABNORMAL HIGH (ref 70–99)
Glucose-Capillary: 116 mg/dL — ABNORMAL HIGH (ref 70–99)

## 2019-11-02 LAB — PHOSPHORUS: Phosphorus: 3.7 mg/dL (ref 2.5–4.6)

## 2019-11-02 LAB — MAGNESIUM: Magnesium: 2.1 mg/dL (ref 1.7–2.4)

## 2019-11-02 LAB — PROCALCITONIN: Procalcitonin: <0.1 ng/mL

## 2019-11-02 LAB — FERRITIN: Ferritin: 436 ng/mL — ABNORMAL HIGH (ref 24–336)

## 2019-11-02 MED ORDER — HYDROCODONE-ACETAMINOPHEN 5-325 MG PO TABS: 1.0000 | ORAL_TABLET | Freq: Four times a day (QID) | ORAL | Status: DC | PRN

## 2019-11-02 MED ORDER — METHYLPREDNISOLONE SODIUM SUCC 40 MG IJ SOLR
40.0000 mg | Freq: Every day | INTRAMUSCULAR | Status: DC
  Administered 2019-11-03 – 2019-11-04 (×2): 40 mg via INTRAVENOUS
  Filled 2019-11-02 (×2): qty 1

## 2019-11-02 NOTE — Progress Notes (Signed)
Pt resting quietly today, decreased to 3 liters [place] with sat 97% currently. 4th dose of remdesivir given today. No complaint of pain. Talking to family on phone.

## 2019-11-02 NOTE — Progress Notes (Signed)
Occupational Therapy Evaluation Patient Details Name: Brandon Gallagher MRN: [id number] DOB: Mar 11, 1961 Today's Date: 11/02/2019    History of Present Illness Patient is a 58 y.o. male with PMHx of IBS, IgA deficiency, recurrent diverticulitis , anxiety, GERD who had tested positive  for Covid on 12/6 .presented to [facility] ED on 12/17 with fever, chills, cough and worsening shortness of breath, abdominal pain, had a CT scan of the abdomen on 12/14 . IN ED found to have hypoxia, sinus bradycardia.   Clinical Impression   PTA pt lived with his wife, independent in all ADL, IADL, and mobility tasks. Pt does not ambulate with an assistive device and reports several falls in the last 6 months. Pt does not use oxygen at home and is currently on 4L [place]. Pt currently independent to min assist for self-care and functional transfer tasks. Pt able to ambulate to/from bathroom and complete toileting as well as hygiene tasks at the sink. SpO2 decreased to 88% following task with quick return back to 90s following seated rest break. 2/4 DOE. Pt reports anxiety related to shortness of breath. Educated and provided pt with handouts regarding relaxation strategies and energy conservation techniques. Pt demonstrates decreased strength, endurance, balance, standing tolerance, and activity tolerance impacting ability to complete self-care and functional transfer tasks. Recommend skilled OT services to address above deficits in order to promote function and prevent further decline.     Follow Up Recommendations  No OT follow up    Equipment Recommendations  3 in 1 bedside commode(for use in shower)    Recommendations for Other Services       Precautions / Restrictions Precautions Precautions: Fall Restrictions Weight Bearing Restrictions: No      Mobility Bed Mobility               General bed mobility comments: Pt seated in bedside chair upon OT arrival  Transfers Overall transfer level: Needs  assistance Equipment used: None Transfers: Sit to/from Stand Sit to Stand: Supervision              Balance Overall balance assessment: Mild deficits observed, not formally tested                                         ADL either performed or assessed with clinical judgement   ADL Overall ADL's : Needs assistance/impaired Eating/Feeding: Independent;Sitting   Grooming: Supervision/safety;Standing   Upper Body Bathing: Supervision/ safety;Set up;Sitting   Lower Body Bathing: Minimal assistance;Sit to/from stand;Sitting/lateral leans   Upper Body Dressing : Set up;Supervision/safety;Sitting   Lower Body Dressing: Minimal assistance;Supervision/safety;Sit to/from stand;Sitting/lateral leans   Toilet Transfer: Min guard;Supervision/safety;Ambulation;Regular Museum/gallery exhibitions officer and Hygiene: Min guard;Supervision/safety;Sitting/lateral lean;Sit to/from stand       Functional mobility during ADLs: Min guard General ADL Comments: Pt able to ambulate to/from bathroom and complete hygiene tasks at the sink     Vision Baseline Vision/History: Wears glasses Wears Glasses: Reading only       Perception     Praxis      Pertinent Vitals/Pain Pain Assessment: 0-10 Pain Score: 2  Pain Location: Abdomen Pain Descriptors / Indicators: Sore Pain Intervention(s): Monitored during session     Hand Dominance Right   Extremity/Trunk Assessment Upper Extremity Assessment Upper Extremity Assessment: Generalized weakness   Lower Extremity Assessment Lower Extremity Assessment: Defer to PT evaluation       Communication  Communication Communication: No difficulties   Cognition Arousal/Alertness: Awake/alert Behavior During Therapy: WFL for tasks assessed/performed;Anxious Overall Cognitive Status: Within Functional Limits for tasks assessed                                     General Comments  Pt on 4L [place]. SpO2  decreased to 88% following toileting and hygiene tasks. Quick return back to 90s with seated rest break. Educated and provided pt with handouts regarding energy conservation and relaxation strategies.    Exercises Exercises: Other exercises Other Exercises Other Exercises: Incentive spirometer x 10 with min cues on technique. Pulling 1079mL. Other Exercises: Flutter valve x 10   Shoulder Instructions      Home Living Family/patient expects to be discharged to:: Private residence Living Arrangements: Children Available Help at Discharge: Family Type of Home: House Home Access: Stairs to enter Technical brewer of Steps: 2 Entrance Stairs-Rails: None Home Layout: One level     Bathroom Shower/Tub: Occupational psychologist: Standard     Home Equipment: None          Prior Functioning/Environment Level of Independence: Independent        Comments: Pt independent with ADLs, IADLs, and mobility. Pt still drives. Pt reports several falls in the last 6 months. Pt does not use oxygen at home.        OT Problem List: Decreased strength;Decreased activity tolerance;Impaired balance (sitting and/or standing);Decreased knowledge of use of DME or AE;Cardiopulmonary status limiting activity      OT Treatment/Interventions: Self-care/ADL training;Therapeutic exercise;Neuromuscular education;Energy conservation;DME and/or AE instruction;Therapeutic activities;Patient/family education;Balance training    OT Goals(Current goals can be found in the care plan section) Acute Rehab OT Goals Patient Stated Goal: To go home Time For Goal Achievement: 11/16/19 Potential to Achieve Goals: Good ADL Goals Pt Will Perform Grooming: Independently;standing Pt Will Perform Lower Body Bathing: with modified independence;sit to/from stand Pt Will Perform Lower Body Dressing: with modified independence;sit to/from stand Pt Will Transfer to Toilet: with modified  independence;ambulating Pt Will Perform Toileting - Clothing Manipulation and hygiene: with modified independence;sit to/from stand Additional ADL Goal #1: Pt to recall and verbalize 3 energy conservation strategies with 0 verbal cues. Additional ADL Goal #2: Pt to recall and verbalize 3 relaxation strategies with 0 verbal cues.  OT Frequency: Min 2X/week   Barriers to D/C:            Co-evaluation              AM-PAC OT "6 Clicks" Daily Activity     Outcome Measure Help from another person eating meals?: None Help from another person taking care of personal grooming?: A Little Help from another person toileting, which includes using toliet, bedpan, or urinal?: A Little Help from another person bathing (including washing, rinsing, drying)?: A Little Help from another person to put on and taking off regular upper body clothing?: A Little Help from another person to put on and taking off regular lower body clothing?: A Little 6 Click Score: 19   End of Session Equipment Utilized During Treatment: Oxygen Nurse Communication: Mobility status  Activity Tolerance: Patient tolerated treatment well Patient left: in chair;with call bell/phone within reach  OT Visit Diagnosis: Unsteadiness on feet (R26.81);Muscle weakness (generalized) (M62.81)                Time: 8413-2440 OT Time Calculation (min): 42 min Charges:  OT General Charges $OT Visit: 1 Visit OT Evaluation $OT Eval Moderate Complexity: 1 Mod OT Treatments $Self Care/Home Management : 8-22 mins $Therapeutic Activity: 8-22 mins  Mauri Brooklyn OTR/L [phone]   Mauri Brooklyn 11/02/2019, 3:11 PM

## 2019-11-02 NOTE — Progress Notes (Addendum)
PROGRESS NOTE                                                                                                                                                                                                             Patient Demographics:    Brandon Gallagher, is a 58 y.o. male, DOB - 07/18/1961, UXL:[id number]  Outpatient Primary MD for the patient is Nicoletta Dress, MD   Admit date - 10/30/2019   LOS - 3  No chief complaint on file.      Brief Narrative: Patient is a 58 y.o. male with PMHx of IBS, IgA deficiency, recurrent diverticulitis (claims he just finished a course of antimicrobial therapy approximately 2 weeks back), anxiety, GERD who had tested positive  for Covid on 12/6 (at [facility] of the [facility] care everywhere).presented to [facility] ED on 12/17 with fever, chills, cough and worsening shortness of breath.  He was found to have acute hypoxic respiratory failure along with sinus bradycardia-subsequently transferred to [facility] for further evaluation and treatment.   He claims that he is started having  recurrent left lower quadrant abdominal pain that is very reminiscent of his diverticulitis on 12/14-and had a CT scan of the abdomen on 12/14 at [facility] that did not show any active diverticulitis.   Significant events: 12/6>> Covid positive (at [place] of the [facility] care everywhere) 12/14>> recurrent left lower quadrant pain-concern for diverticulitis-negative CT abd [facility] 12/17>> presented at [facility] ED with shortness of breath and cough 1217>> CT angio chest at [facility]-negative for PE, but severe bilateral patchy/groundglass infiltrates.   Subjective:    Brandon Gallagher feels that his shortness of breath is since may be a bit better.  He thinks he has less pain in his left lower quadrant of his abdomen-but on palpation-he continues to have tenderness.   Assessment  & Plan :    Acute Hypoxic Resp Failure due to Covid 19 Viral pneumonia and concurrent bacterial pneumonia (per H&P procalcitonin elevated at [facility]): Remains essentially the same compared to yesterday-requiring around 5 L of oxygen.  Continue steroids/remdesivir, clinically improving will start tapering of steroids.  Fever: afebrile  O2 requirements:  SpO2: 92 % O2 Flow Rate (L/min): 4 L/min   COVID-19 Labs: Recent Labs    10/30/19 2155 11/01/19 0155 11/02/19 0349  DDIMER 1.59* 1.16*  1.07*  FERRITIN  --  502* 436*  LDH 279*  --   --   CRP 5.0* 2.6* 1.1*       Component Value Date/Time   BNP 255.5 (H) 10/30/2019 2155    Recent Labs  Lab 10/30/19 2155 11/01/19 0155 11/02/19 0349  PROCALCITON 0.12 <0.10 <0.10    No results found for: [id number]   COVID-19 Medications: Steroids: 12/17>> Remdesivir: 12/18>>  Prone/Incentive Spirometry: encouraged patient to lie prone for 3-4 hours at a time for a total of 16 hours a day, and to encourage incentive spirometry use 3-4/hour.   Sinus bradycardia: Asymptomatic-suspect-from undiagnosed OSA.  Outpatient PCP follow-up and outpatient sleep study recommended.  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  Recurrent diverticulitis: He has started having LLQ pain since this past Monday-although [facility] abdomen at [facility] was negative for diverticulitis-he continues to have LLQ tenderness on exam.  Since LLQ tenderness persists-we will downgrade his diet to full liquid today.  Continue Rocephin/Flagyl.  If he continues to have LLQ pain-he may need repeat imaging at some point.    Per patient-he follows with Dr. Etter Sjogren is advised surgery/colectomy given the recurrent nature of his diverticulitis.  Rocephin: 12/17>> Flagyl: 12/18>>  GERD: Continue PPI/H2 blocker  IgA deficiency: Follows with allergist-reviewed  outpatient notes-looks like patient was not felt to require immunoglobulin infusion-due to significant antibody response to the Pneumovax vaccine.  He does have a history of recurrent sinus and pulmonary infections.  Obesity: BMI 35, follow up PCP.   Consults  :  None  Procedures  :  None  Condition - Extremely Guarded  Family Communication  :  Previous MD updated - Spouse updated over the phone on 12/19  Code Status :  Full Code  Diet :  Diet Order            Diet full liquid Room service appropriate? Yes; Fluid consistency: Thin  Diet effective now              Disposition Plan  :  Remain hospitalized  Barriers to discharge: Hypoxia requiring O2 supplementation/complete 5 days of IV Remdesivir  Antimicorbials  :    Anti-infectives (From admission, onward)   Start     Dose/Rate Route Frequency Ordered Stop   11/01/19 1300  remdesivir 100 mg in sodium chloride 0.9 % 100 mL IVPB     100 mg 200 mL/hr over 30 Minutes Intravenous Daily 11/01/19 1201 11/05/19 0959   10/31/19 1100  azithromycin (ZITHROMAX) 500 mg in sodium chloride 0.9 % 250 mL IVPB     500 mg 250 mL/hr over 60 Minutes Intravenous Every 24 hours 10/30/19 2242 11/01/19 1325   10/31/19 1100  metroNIDAZOLE (FLAGYL) IVPB 500 mg     500 mg 100 mL/hr over 60 Minutes Intravenous Every 8 hours 10/31/19 1029     10/31/19 1000  cefTRIAXone (ROCEPHIN) 2 g in sodium chloride 0.9 % 100 mL IVPB     2 g 200 mL/hr over 30 Minutes Intravenous Every 24 hours 10/30/19 2242 11/05/19 0959      DVT Prophylaxis  :  Lovenox   Inpatient Medications  Scheduled Meds: . albuterol  2 puff Inhalation Q6H  . enoxaparin (LOVENOX) injection  60 mg Subcutaneous Q24H  . famotidine  20 mg Oral Daily  . fluticasone  1 spray Each Nare Daily  . fluticasone furoate-vilanterol  1 puff Inhalation Daily  . furosemide  40 mg Intravenous Daily  . hydrocortisone  25 mg Rectal BID  . insulin aspart  0-9 Units Subcutaneous TID WC  .  methylPREDNISolone (SOLU-MEDROL) injection  60 mg Intravenous BID  . montelukast  10 mg Oral QHS  . pantoprazole  40 mg Oral Daily  . sodium chloride flush  3 mL Intravenous Q12H   Continuous Infusions: . sodium chloride    . cefTRIAXone (ROCEPHIN)  IV 2 g (11/02/19 0913)  . metronidazole 500 mg (11/02/19 0504)  . remdesivir 100 mg in NS 100 mL 100 mg (11/02/19 1026)   PRN Meds:.sodium chloride, acetaminophen, ALPRAZolam, HYDROcodone-acetaminophen, ondansetron **OR** ondansetron (ZOFRAN) IV, sodium chloride flush   Time Spent in minutes  35   See all Orders from today for further details  Lala Lund M.D on 11/02/2019 at 11:00 AM  To page go to [email or web address] - use universal password  [facility] -  Office  [phone]    Objective:   Vitals:   11/01/19 1923 11/01/19 2341 11/02/19 0347 11/02/19 0745  BP: 119/60 (!) 139/99 127/78 124/79  Pulse: (!) 58 (!) 59 (!) 51 (!) 51  Resp:  17 20 19   Temp: 98.3 F (36.8 C) 98.4 F (36.9 C) 97.9 F (36.6 C) 98.8 F (37.1 C)  TempSrc: Oral Oral Oral Oral  SpO2:  93% 94% 92%  Weight:      Height:        Wt Readings from Last 3 Encounters:  10/30/19 115.2 kg  08/07/19 116.1 kg  07/10/19 116.1 kg     Intake/Output Summary (Last 24 hours) at 11/02/2019 1100 Last data filed at 11/02/2019 0504 Gross per 24 hour  Intake 1343.89 ml  Output 650 ml  Net 693.89 ml     Physical Exam  Awake Alert,  No new F.N deficits, Normal affect [place].AT,PERRAL Supple Neck,No JVD, No cervical lymphadenopathy appriciated.  Symmetrical Chest wall movement, Good air movement bilaterally, CTAB RRR,No Gallops, Rubs or new Murmurs, No Parasternal Heave +ve B.Sounds, Abd Soft, No tenderness, No organomegaly appriciated, No rebound - guarding or rigidity. No Cyanosis, Clubbing or edema, No new Rash or bruise    Data Review:    CBC Recent Labs  Lab 10/30/19 2155 11/01/19 0155 11/02/19 0349  WBC 9.5 12.4* 11.8*  HGB 14.6 14.4 14.9   HCT 43.1 42.3 45.1  PLT 251 302 351  MCV 91.1 90.0 91.3  MCH 30.9 30.6 30.2  MCHC 33.9 34.0 33.0  RDW 12.6 12.4 12.4  LYMPHSABS 0.6* 0.9 0.9  MONOABS 0.3 0.7 0.8  EOSABS 0.0 0.0 0.0  BASOSABS 0.0 0.1 0.1    Chemistries  Recent Labs  Lab 10/30/19 2155 11/01/19 0155 11/02/19 0349  NA 140 138 140  K 4.5 4.0 4.6  CL 108 102 102  CO2 22 26 28   GLUCOSE 166* 179* 116*  BUN 18 22* 22*  CREATININE 0.90 0.95 1.01  CALCIUM 8.8* 8.7* 9.1  MG  --  2.1 2.1  AST 40 26 26  ALT 36 32 32  ALKPHOS 58 54 57  BILITOT 1.0 0.9 0.8   ------------------------------------------------------------------------------------------------------------------ No results for input(s): CHOL, HDL, LDLCALC, TRIG, CHOLHDL, LDLDIRECT in the last 72 hours.  Lab Results  Component Value Date   HGBA1C 6.6 (H) 11/01/2019   ------------------------------------------------------------------------------------------------------------------ Recent Labs    10/30/19 2240  TSH 0.308*   ------------------------------------------------------------------------------------------------------------------ Recent Labs    11/01/19 0155 11/02/19 0349  FERRITIN 502* 436*    Coagulation profile No  results for input(s): INR, PROTIME in the last 168 hours.  Recent Labs    11/01/19 0155 11/02/19 0349  DDIMER 1.16* 1.07*    Cardiac Enzymes No results for input(s): CKMB, TROPONINI, MYOGLOBIN in the last 168 hours.  Invalid input(s): CK ------------------------------------------------------------------------------------------------------------------    Component Value Date/Time   BNP 255.5 (H) 10/30/2019 2155    Micro Results No results found for this or any previous visit (from the past 240 hour(s)).  Radiology Reports No results found.

## 2019-11-02 NOTE — Progress Notes (Addendum)
1900 Report from day RN  1950 Vitals and shift assessment completed.Pt on [place] 3 L. Off the tele monitor per MD order. Pt uses urinal. Call bell in reach. Bed alarmed. Pt states he updates his own family and no need to call them.  2145 PM medications given   2320 Rounds on pt, pt resting in bed  0200 Rounds on pt, pt asleep   0420 Rounds and vitals completed on patient.  0600 Rounds on pt, pt resting in bed  0700 Report given to oncoming RN

## 2019-11-03 LAB — COMPREHENSIVE METABOLIC PANEL
ALT: 30 U/L (ref 0–44)
AST: 27 U/L (ref 15–41)
Albumin: 2.7 g/dL — ABNORMAL LOW (ref 3.5–5.0)
Alkaline Phosphatase: 55 U/L (ref 38–126)
Anion gap: 10 (ref 5–15)
BUN: 15 mg/dL (ref 6–20)
CO2: 26 mmol/L (ref 22–32)
Calcium: 8.7 mg/dL — ABNORMAL LOW (ref 8.9–10.3)
Chloride: 101 mmol/L (ref 98–111)
Creatinine, Ser: 0.91 mg/dL (ref 0.61–1.24)
GFR calc Af Amer: >60 mL/min (ref >60)
GFR calc non Af Amer: >60 mL/min (ref >60)
Glucose, Bld: 68 mg/dL — ABNORMAL LOW (ref 70–99)
Potassium: 4 mmol/L (ref 3.5–5.1)
Sodium: 137 mmol/L (ref 135–145)
Total Bilirubin: 0.8 mg/dL (ref 0.3–1.2)
Total Protein: 5.7 g/dL — ABNORMAL LOW (ref 6.5–8.1)

## 2019-11-03 LAB — CBC WITH DIFFERENTIAL/PLATELET
Abs Immature Granulocytes: 0.62 10*3/uL — ABNORMAL HIGH (ref 0.00–0.07)
Basophils Absolute: 0.1 10*3/uL (ref 0.0–0.1)
Basophils Relative: 1 %
Eosinophils Absolute: 0.1 10*3/uL (ref 0.0–0.5)
Eosinophils Relative: 1 %
HCT: 44.2 % (ref 39.0–52.0)
Hemoglobin: 14.7 g/dL (ref 13.0–17.0)
Immature Granulocytes: 8 %
Lymphocytes Relative: 12 %
Lymphs Abs: 1 10*3/uL (ref 0.7–4.0)
MCH: 30.4 pg (ref 26.0–34.0)
MCHC: 33.3 g/dL (ref 30.0–36.0)
MCV: 91.3 fL (ref 80.0–100.0)
Monocytes Absolute: 0.6 10*3/uL (ref 0.1–1.0)
Monocytes Relative: 8 %
Neutro Abs: 5.8 10*3/uL (ref 1.7–7.7)
Neutrophils Relative %: 70 %
Platelets: 310 10*3/uL (ref 150–400)
RBC: 4.84 MIL/uL (ref 4.22–5.81)
RDW: 12.6 % (ref 11.5–15.5)
WBC: 8.1 10*3/uL (ref 4.0–10.5)
nRBC: 0 % (ref 0.0–0.2)

## 2019-11-03 LAB — PHOSPHORUS: Phosphorus: 2.9 mg/dL (ref 2.5–4.6)

## 2019-11-03 LAB — GLUCOSE, CAPILLARY
Glucose-Capillary: 93 mg/dL (ref 70–99)
Glucose-Capillary: 123 mg/dL — ABNORMAL HIGH (ref 70–99)
Glucose-Capillary: 175 mg/dL — ABNORMAL HIGH (ref 70–99)

## 2019-11-03 LAB — FERRITIN: Ferritin: 459 ng/mL — ABNORMAL HIGH (ref 24–336)

## 2019-11-03 LAB — MAGNESIUM: Magnesium: 1.9 mg/dL (ref 1.7–2.4)

## 2019-11-03 LAB — D-DIMER, QUANTITATIVE: D-Dimer, Quant: 1.37 ug/mL-FEU — ABNORMAL HIGH (ref 0.00–0.50)

## 2019-11-03 LAB — C-REACTIVE PROTEIN: CRP: 1.4 mg/dL — ABNORMAL HIGH (ref <1.0)

## 2019-11-03 MED ORDER — METHIMAZOLE 5 MG PO TABS
5.0000 mg | ORAL_TABLET | Freq: Two times a day (BID) | ORAL | Status: DC
  Administered 2019-11-03 – 2019-11-05 (×4): 5 mg via ORAL
  Filled 2019-11-03 (×6): qty 1

## 2019-11-03 NOTE — Progress Notes (Signed)
PROGRESS NOTE                                                                                                                                                                                                             Patient Demographics:    Brandon Gallagher, is a 58 y.o. male, DOB - 1961-03-08, DEY:[id number]  Outpatient Primary MD for the patient is Nicoletta Dress, MD   Admit date - 10/30/2019   LOS - 4  No chief complaint on file.      Brief Narrative: Patient is a 58 y.o. male with PMHx of IBS, IgA deficiency, recurrent diverticulitis (claims he just finished a course of antimicrobial therapy approximately 2 weeks back), anxiety, GERD who had tested positive  for Covid on 12/6 (at [facility] the [facility] care everywhere).presented to [facility] ED on 12/17 with fever, chills, cough and worsening shortness of breath.  He was found to have acute hypoxic respiratory failure along with sinus bradycardia-subsequently transferred to [facility] for further evaluation and treatment.   He claims that he is started having  recurrent left lower quadrant abdominal pain that is very reminiscent of his diverticulitis on 12/14-and had a CT scan of the abdomen on 12/14 at [facility] that did not show any active diverticulitis.   Significant events: 12/6>> Covid positive (at [place] of the [facility] care everywhere) 12/14>> recurrent left lower quadrant pain-concern for diverticulitis-negative CT abd [facility] 12/17>> presented at [facility] ED with shortness of breath and cough 1217>> CT angio chest at [facility]-negative for PE, but severe bilateral patchy/groundglass infiltrates.   Subjective:   Patient in bed, appears comfortable, denies any headache, no fever, no chest pain or pressure, no shortness of breath , no abdominal pain. No focal weakness.   Assessment  & Plan :   Acute Hypoxic Resp Failure due to  Covid 19 Viral pneumonia and concurrent bacterial pneumonia (per H&P procalcitonin elevated at [facility]): Remains essentially the same compared to yesterday-requiring around 5 L of oxygen.  Continue steroids/remdesivir, clinically improving continue to taper steroids.  Fever: afebrile  O2 requirements:  SpO2: 93 % O2 Flow Rate (L/min): 3 L/min   COVID-19 Labs: Recent Labs    11/01/19 0155 11/02/19 0349 11/03/19 0323  DDIMER 1.16* 1.07* 1.37*  FERRITIN 502* 436* 459*  CRP 2.6* 1.1* 1.4*  Component Value Date/Time   BNP 255.5 (H) 10/30/2019 2155    Recent Labs  Lab 10/30/19 2155 11/01/19 0155 11/02/19 0349  PROCALCITON 0.12 <0.10 <0.10    No results found for: SARSCOV2NAA   COVID-19 Medications: Steroids: 12/17>> Remdesivir: 12/18>>  Prone/Incentive Spirometry: encouraged patient to lie prone for 3-4 hours at a time for a total of 16 hours a day, and to encourage incentive spirometry use 3-4/hour.   Sinus bradycardia: Asymptomatic-suspect-from undiagnosed OSA.  Outpatient PCP follow-up and outpatient sleep study recommended.  Lab Results  Component Value Date   TSH 0.308 (L) 10/30/2019  ' Results for ELEK, HOLDERNESS" (MRN [id number]) as of 11/03/2019 12:34  Ref. Range 10/30/2019 22:40  TSH Latest Ref Range: 0.350 - 4.500 uIU/mL 0.308 (L)  T4,Free(Direct) Latest Ref Range: 0.61 - 1.12 ng/dL 1.16 (H)     Mild hyperthyroidism: Free T4 is low, will start on low-dose methimazole.  Recurrent diverticulitis: He has started having LLQ pain since this past Monday-although CT abdomen at [facility] was negative for diverticulitis-he continues to have LLQ tenderness on exam.  Since LLQ tenderness persists-we will downgrade his diet to full liquid today.  Continue Rocephin/Flagyl.  If he continues to have LLQ pain-he may need repeat imaging at some point.    Per patient-he follows with Dr. Etter Sjogren is advised surgery/colectomy given the recurrent  nature of his diverticulitis.  Rocephin: 12/17>> Flagyl: 12/18>>  GERD: Continue PPI/H2 blocker  IgA deficiency: Follows with allergist-reviewed outpatient notes-looks like patient was not felt to require immunoglobulin infusion-due to significant antibody response to the Pneumovax vaccine.  He does have a history of recurrent sinus and pulmonary infections.  Obesity: BMI 35, follow up PCP.   Consults  :  None  Procedures  :  None  Condition - Extremely Guarded  Family Communication  :  Previous MD updated - Spouse updated over the phone on 12/19  Code Status :  Full Code  Diet :  Diet Order            DIET SOFT Room service appropriate? Yes; Fluid consistency: Thin  Diet effective now              Disposition Plan  :  Remain hospitalized  Barriers to discharge: Hypoxia requiring O2 supplementation/complete 5 days of IV Remdesivir  Antimicorbials  :    Anti-infectives (From admission, onward)   Start     Dose/Rate Route Frequency Ordered Stop   11/01/19 1300  remdesivir 100 mg in sodium chloride 0.9 % 100 mL IVPB     100 mg 200 mL/hr over 30 Minutes Intravenous Daily 11/01/19 1201 11/05/19 0959   10/31/19 1100  azithromycin (ZITHROMAX) 500 mg in sodium chloride 0.9 % 250 mL IVPB     500 mg 250 mL/hr over 60 Minutes Intravenous Every 24 hours 10/30/19 2242 11/01/19 1325   10/31/19 1100  metroNIDAZOLE (FLAGYL) IVPB 500 mg     500 mg 100 mL/hr over 60 Minutes Intravenous Every 8 hours 10/31/19 1029     10/31/19 1000  cefTRIAXone (ROCEPHIN) 2 g in sodium chloride 0.9 % 100 mL IVPB     2 g 200 mL/hr over 30 Minutes Intravenous Every 24 hours 10/30/19 2242 11/05/19 0959      DVT Prophylaxis  :  Lovenox   Inpatient Medications  Scheduled Meds: . albuterol  2 puff Inhalation Q6H  . enoxaparin (LOVENOX) injection  60 mg Subcutaneous Q24H  . famotidine  20 mg Oral Daily  . fluticasone  1 spray Each Nare Daily  . fluticasone furoate-vilanterol  1 puff Inhalation  Daily  . insulin aspart  0-9 Units Subcutaneous TID WC  . methylPREDNISolone (SOLU-MEDROL) injection  40 mg Intravenous Daily  . montelukast  10 mg Oral QHS  . pantoprazole  40 mg Oral Daily   Continuous Infusions: . cefTRIAXone (ROCEPHIN)  IV 2 g (11/03/19 1208)  . metronidazole Stopped (11/03/19 0528)  . remdesivir 100 mg in NS 100 mL 100 mg (11/03/19 1117)   PRN Meds:.acetaminophen, ALPRAZolam, HYDROcodone-acetaminophen, [DISCONTINUED] ondansetron **OR** ondansetron (ZOFRAN) IV   Time Spent in minutes  35   See all Orders from today for further details  Lala Lund M.D on 11/03/2019 at 12:35 PM  To page go to [email or web address] - use universal password  [facility] -  Office  [phone]    Objective:   Vitals:   11/03/19 0100 11/03/19 0428 11/03/19 0816 11/03/19 1217  BP: 119/65 124/78 140/81   Pulse: 63 61 68   Resp: 18     Temp: 98.6 F (37 C) 97.8 F (36.6 C) 99.6 F (37.6 C) 98.4 F (36.9 C)  TempSrc: Axillary Axillary Oral Oral  SpO2: 96% 94% 93%   Weight:      Height:        Wt Readings from Last 3 Encounters:  10/30/19 115.2 kg  08/07/19 116.1 kg  07/10/19 116.1 kg     Intake/Output Summary (Last 24 hours) at 11/03/2019 1235 Last data filed at 11/03/2019 0700 Gross per 24 hour  Intake 990.03 ml  Output 225 ml  Net 765.03 ml     Physical Exam  Awake Alert, Oriented X 3, No new F.N deficits, Normal affect [place].AT,PERRAL Supple Neck,No JVD, No cervical lymphadenopathy appriciated.  Symmetrical Chest wall movement, Good air movement bilaterally, CTAB RRR,No Gallops, Rubs or new Murmurs, No Parasternal Heave +ve B.Sounds, Abd Soft, No tenderness, No organomegaly appriciated, No rebound - guarding or rigidity. No Cyanosis, Clubbing or edema, No new Rash or bruise    Data Review:    CBC Recent Labs  Lab 10/30/19 2155 11/01/19 0155 11/02/19 0349 11/03/19 0323  WBC 9.5 12.4* 11.8* 8.1  HGB 14.6 14.4 14.9 14.7  HCT 43.1 42.3 45.1  44.2  PLT 251 302 351 310  MCV 91.1 90.0 91.3 91.3  MCH 30.9 30.6 30.2 30.4  MCHC 33.9 34.0 33.0 33.3  RDW 12.6 12.4 12.4 12.6  LYMPHSABS 0.6* 0.9 0.9 1.0  MONOABS 0.3 0.7 0.8 0.6  EOSABS 0.0 0.0 0.0 0.1  BASOSABS 0.0 0.1 0.1 0.1    Chemistries  Recent Labs  Lab 10/30/19 2155 11/01/19 0155 11/02/19 0349 11/03/19 0323  NA 140 138 140 137  K 4.5 4.0 4.6 4.0  CL 108 102 102 101  CO2 22 26 28 26   GLUCOSE 166* 179* 116* 68*  BUN 18 22* 22* 15  CREATININE 0.90 0.95 1.01 0.91  CALCIUM 8.8* 8.7* 9.1 8.7*  MG  --  2.1 2.1 1.9  AST 40 26 26 27   ALT 36 32 32 30  ALKPHOS 58 54 57 55  BILITOT 1.0 0.9 0.8 0.8   ------------------------------------------------------------------------------------------------------------------ No results for input(s): CHOL, HDL, LDLCALC, TRIG, CHOLHDL, LDLDIRECT in the last 72 hours.  Lab Results  Component Value Date   HGBA1C 6.6 (H) 11/01/2019   ------------------------------------------------------------------------------------------------------------------ No results for input(s): TSH, T4TOTAL, T3FREE, THYROIDAB in the last 72 hours.  Invalid input(s): FREET3 ------------------------------------------------------------------------------------------------------------------ Recent Labs    11/02/19 0349 11/03/19 0323  FERRITIN 436* 459*  Coagulation profile No results for input(s): INR, PROTIME in the last 168 hours.  Recent Labs    11/02/19 0349 11/03/19 0323  DDIMER 1.07* 1.37*    Cardiac Enzymes No results for input(s): CKMB, TROPONINI, MYOGLOBIN in the last 168 hours.  Invalid input(s): CK ------------------------------------------------------------------------------------------------------------------    Component Value Date/Time   BNP 255.5 (H) 10/30/2019 2155    Micro Results No results found for this or any previous visit (from the past 240 hour(s)).  Radiology Reports No results found.

## 2019-11-03 NOTE — Progress Notes (Signed)
PT Cancellation Note  Patient Details Name: Brandon Gallagher MRN: [id number] DOB: 08/28/1961   Cancelled Treatment:    Reason Eval/Treat Not Completed: Other (comment), patient not available x 2 when PT attempted.    Claretha Cooper 11/03/2019, 4:44 PM

## 2019-11-04 LAB — COMPREHENSIVE METABOLIC PANEL
ALT: 29 U/L (ref 0–44)
AST: 27 U/L (ref 15–41)
Albumin: 3.1 g/dL — ABNORMAL LOW (ref 3.5–5.0)
Alkaline Phosphatase: 59 U/L (ref 38–126)
Anion gap: 14 (ref 5–15)
BUN: 14 mg/dL (ref 6–20)
CO2: 23 mmol/L (ref 22–32)
Calcium: 9.2 mg/dL (ref 8.9–10.3)
Chloride: 100 mmol/L (ref 98–111)
Creatinine, Ser: 1.06 mg/dL (ref 0.61–1.24)
GFR calc Af Amer: >60 mL/min (ref >60)
GFR calc non Af Amer: >60 mL/min (ref >60)
Glucose, Bld: 75 mg/dL (ref 70–99)
Potassium: 3.7 mmol/L (ref 3.5–5.1)
Sodium: 137 mmol/L (ref 135–145)
Total Bilirubin: 0.8 mg/dL (ref 0.3–1.2)
Total Protein: 6.7 g/dL (ref 6.5–8.1)

## 2019-11-04 LAB — MAGNESIUM: Magnesium: 2.2 mg/dL (ref 1.7–2.4)

## 2019-11-04 LAB — CBC WITH DIFFERENTIAL/PLATELET
Abs Immature Granulocytes: 0.74 10*3/uL — ABNORMAL HIGH (ref 0.00–0.07)
Basophils Absolute: 0 10*3/uL (ref 0.0–0.1)
Basophils Relative: 0 %
Eosinophils Absolute: 0 10*3/uL (ref 0.0–0.5)
Eosinophils Relative: 0 %
HCT: 50.5 % (ref 39.0–52.0)
Hemoglobin: 16.6 g/dL (ref 13.0–17.0)
Immature Granulocytes: 7 %
Lymphocytes Relative: 9 %
Lymphs Abs: 0.9 10*3/uL (ref 0.7–4.0)
MCH: 30.2 pg (ref 26.0–34.0)
MCHC: 32.9 g/dL (ref 30.0–36.0)
MCV: 91.8 fL (ref 80.0–100.0)
Monocytes Absolute: 0.7 10*3/uL (ref 0.1–1.0)
Monocytes Relative: 7 %
Neutro Abs: 8 10*3/uL — ABNORMAL HIGH (ref 1.7–7.7)
Neutrophils Relative %: 77 %
Platelets: 372 10*3/uL (ref 150–400)
RBC: 5.5 MIL/uL (ref 4.22–5.81)
RDW: 12.5 % (ref 11.5–15.5)
WBC: 10.5 10*3/uL (ref 4.0–10.5)
nRBC: 0 % (ref 0.0–0.2)

## 2019-11-04 LAB — C-REACTIVE PROTEIN: CRP: 6 mg/dL — ABNORMAL HIGH (ref <1.0)

## 2019-11-04 LAB — FERRITIN: Ferritin: 536 ng/mL — ABNORMAL HIGH (ref 24–336)

## 2019-11-04 LAB — GLUCOSE, CAPILLARY
Glucose-Capillary: 87 mg/dL (ref 70–99)
Glucose-Capillary: 167 mg/dL — ABNORMAL HIGH (ref 70–99)
Glucose-Capillary: 143 mg/dL — ABNORMAL HIGH (ref 70–99)
Glucose-Capillary: 139 mg/dL — ABNORMAL HIGH (ref 70–99)

## 2019-11-04 LAB — PHOSPHORUS: Phosphorus: 2.8 mg/dL (ref 2.5–4.6)

## 2019-11-04 LAB — D-DIMER, QUANTITATIVE: D-Dimer, Quant: 0.94 ug/mL-FEU — ABNORMAL HIGH (ref 0.00–0.50)

## 2019-11-04 MED ORDER — METHYLPREDNISOLONE SODIUM SUCC 40 MG IJ SOLR: 30.0000 mg | Freq: Every day | INTRAMUSCULAR | Status: DC

## 2019-11-04 MED ORDER — METRONIDAZOLE 500 MG PO TABS
500.0000 mg | ORAL_TABLET | Freq: Three times a day (TID) | ORAL | Status: DC
  Administered 2019-11-04 – 2019-11-05 (×4): 500 mg via ORAL
  Filled 2019-11-04 (×7): qty 1

## 2019-11-04 NOTE — Progress Notes (Signed)
pulm SATURATION QUALIFICATIONS: (This note is used to comply with regulatory documentation for home oxygen)  Patient Saturations on Room Air at Rest = 87%  Patient Saturations on Room Air while Ambulating = 83%  Patient Saturations on 3 Liters of oxygen while Ambulating = 90%  Please briefly explain why patient needs home oxygen:to maintain saturation > 88% while ambulating. [place] Pager [phone] Office [phone]

## 2019-11-04 NOTE — Progress Notes (Signed)
PROGRESS NOTE                                                                                                                                                                                                             Patient Demographics:    Brandon Gallagher, is a 58 y.o. male, DOB - 02/03/1961, FYB:[id number]  Outpatient Primary MD for the patient is Brandon Dress, MD   Admit date - 10/30/2019   LOS - 5  No chief complaint on file.      Brief Narrative: Patient is a 58 y.o. male with PMHx of IBS, IgA deficiency, recurrent diverticulitis (claims he just finished a course of antimicrobial therapy approximately 2 weeks back), anxiety, GERD who had tested positive  for Covid on 12/6 (at [facility] the [facility] care everywhere).presented to [facility] ED on 12/17 with fever, chills, cough and worsening shortness of breath.  He was found to have acute hypoxic respiratory failure along with sinus bradycardia-subsequently transferred to [facility] for further evaluation and treatment.   He claims that he is started having  recurrent left lower quadrant abdominal pain that is very reminiscent of his diverticulitis on 12/14-and had a CT scan of the abdomen on 12/14 at [facility] that did not show any active diverticulitis.   Significant events: 12/6>> Covid positive (at [place] of the [facility] care everywhere) 12/14>> recurrent left lower quadrant pain-concern for diverticulitis-negative CT abd [facility] 12/17>> presented at [facility] ED with shortness of breath and cough 1217>> CT angio chest at [facility]-negative for PE, but severe bilateral patchy/groundglass infiltrates.   Subjective:   Patient in bed, appears comfortable, denies any headache, no fever, no chest pain or pressure, no shortness of breath , no abdominal pain. No focal weakness.   Assessment  & Plan :   Acute Hypoxic Resp Failure due to  Covid 19 Viral pneumonia and concurrent bacterial pneumonia (per H&P procalcitonin elevated at [facility]): Remains essentially the same compared to yesterday-requiring around 5 L of oxygen.  Continue steroids/remdesivir, clinically improving continue to taper steroids.  Fever: afebrile  O2 requirements:  SpO2: 90 % O2 Flow Rate (L/min): 2 L/min   COVID-19 Labs: Recent Labs    11/02/19 0349 11/03/19 0323 11/04/19 0200  DDIMER 1.07* 1.37* 0.94*  FERRITIN 436* 459* 536*  CRP 1.1* 1.4* 6.0*  Component Value Date/Time   BNP 255.5 (H) 10/30/2019 2155    Recent Labs  Lab 10/30/19 2155 11/01/19 0155 11/02/19 0349  PROCALCITON 0.12 <0.10 <0.10    No results found for: SARSCOV2NAA   COVID-19 Medications: Steroids: 12/17>> Remdesivir: 12/18>>  Prone/Incentive Spirometry: encouraged patient to lie prone for 3-4 hours at a time for a total of 16 hours a day, and to encourage incentive spirometry use 3-4/hour.   Sinus bradycardia: Asymptomatic-suspect-from undiagnosed OSA.  Outpatient PCP follow-up and outpatient sleep study recommended.  Lab Results  Component Value Date   TSH 0.308 (L) 10/30/2019  ' Results for Brandon Gallagher" (MRN [id number]) as of 11/03/2019 12:34  Ref. Range 10/30/2019 22:40  TSH Latest Ref Range: 0.350 - 4.500 uIU/mL 0.308 (L)  T4,Free(Direct) Latest Ref Range: 0.61 - 1.12 ng/dL 1.16 (H)     Mild hyperthyroidism: Free T4 is low, will start on low-dose methimazole.  Recurrent diverticulitis: He had mild clinical diagnosis of diverticulitis this admission, imaging and exam stable, will transition to oral antibiotics and monitor, completely symptom-free now, once discharged follow-up with his GI physician..    Per patient-he follows with Brandon Gallagher is advised surgery/colectomy given the recurrent nature of his diverticulitis.  Rocephin: 12/17>> Flagyl: 12/18>>  GERD: Continue PPI/H2 blocker  IgA deficiency: Follows with  allergist-reviewed outpatient notes-looks like patient was not felt to require immunoglobulin infusion-due to significant antibody response to the Pneumovax vaccine.  He does have a history of recurrent sinus and pulmonary infections.  Obesity: BMI 35, follow up PCP.   Consults  :  None  Procedures  :  None  Condition -fair  Family Communication  :  Previous MD updated - Spouse updated over the phone on 12/19  Code Status :  Full Code  Diet :  Diet Order            DIET SOFT Room service appropriate? Yes; Fluid consistency: Thin  Diet effective now              Disposition Plan  : Home in 1 to 2 days  Barriers to discharge: Hypoxia requiring O2 supplementation/complete 5 days of IV Remdesivir  Antimicorbials  :    Anti-infectives (From admission, onward)   Start     Dose/Rate Route Frequency Ordered Stop   11/01/19 1300  remdesivir 100 mg in sodium chloride 0.9 % 100 mL IVPB     100 mg 200 mL/hr over 30 Minutes Intravenous Daily 11/01/19 1201 11/05/19 0959   10/31/19 1100  azithromycin (ZITHROMAX) 500 mg in sodium chloride 0.9 % 250 mL IVPB     500 mg 250 mL/hr over 60 Minutes Intravenous Every 24 hours 10/30/19 2242 11/01/19 1325   10/31/19 1100  metroNIDAZOLE (FLAGYL) IVPB 500 mg     500 mg 100 mL/hr over 60 Minutes Intravenous Every 8 hours 10/31/19 1029     10/31/19 1000  cefTRIAXone (ROCEPHIN) 2 g in sodium chloride 0.9 % 100 mL IVPB     2 g 200 mL/hr over 30 Minutes Intravenous Every 24 hours 10/30/19 2242 11/04/19 1032      DVT Prophylaxis  :  Lovenox   Inpatient Medications  Scheduled Meds: . albuterol  2 puff Inhalation Q6H  . enoxaparin (LOVENOX) injection  60 mg Subcutaneous Q24H  . famotidine  20 mg Oral Daily  . fluticasone  1 spray Each Nare Daily  . fluticasone furoate-vilanterol  1 puff Inhalation Daily  . insulin aspart  0-9 Units Subcutaneous TID WC  .  methimazole  5 mg Oral BID  . methylPREDNISolone (SOLU-MEDROL) injection  40 mg  Intravenous Daily  . montelukast  10 mg Oral QHS  . pantoprazole  40 mg Oral Daily   Continuous Infusions: . metronidazole 500 mg (11/04/19 0759)  . remdesivir 100 mg in NS 100 mL 100 mg (11/04/19 1123)   PRN Meds:.acetaminophen, ALPRAZolam, HYDROcodone-acetaminophen, [DISCONTINUED] ondansetron **OR** ondansetron (ZOFRAN) IV   Time Spent in minutes  35   See all Orders from today for further details  Lala Lund M.D on 11/04/2019 at 11:26 AM  To page go to [email or web address] - use universal password  [facility] -  Office  [phone]    Objective:   Vitals:   11/03/19 1646 11/03/19 2116 11/04/19 0555 11/04/19 0800  BP: 112/79 130/77 121/68 110/70  Pulse: 83 61 80   Resp: (!) 21 17 16 18   Temp: 97.7 F (36.5 C) 98.8 F (37.1 C) 98.5 F (36.9 C)   TempSrc: Oral Oral Oral   SpO2: 98% 92% 90%   Weight:      Height:        Wt Readings from Last 3 Encounters:  10/30/19 115.2 kg  08/07/19 116.1 kg  07/10/19 116.1 kg     Intake/Output Summary (Last 24 hours) at 11/04/2019 1126 Last data filed at 11/03/2019 2331 Gross per 24 hour  Intake 550 ml  Output 3500 ml  Net -2950 ml     Physical Exam  Awake Alert,   No new F.N deficits, Normal affect [place].AT,PERRAL Supple Neck,No JVD, No cervical lymphadenopathy appriciated.  Symmetrical Chest wall movement, Good air movement bilaterally, CTAB RRR,No Gallops, Rubs or new Murmurs, No Parasternal Heave +ve B.Sounds, Abd Soft, No tenderness, No organomegaly appriciated, No rebound - guarding or rigidity. No Cyanosis, Clubbing or edema, No new Rash or bruise     Data Review:    CBC Recent Labs  Lab 10/30/19 2155 11/01/19 0155 11/02/19 0349 11/03/19 0323 11/04/19 0200  WBC 9.5 12.4* 11.8* 8.1 10.5  HGB 14.6 14.4 14.9 14.7 16.6  HCT 43.1 42.3 45.1 44.2 50.5  PLT 251 302 351 310 372  MCV 91.1 90.0 91.3 91.3 91.8  MCH 30.9 30.6 30.2 30.4 30.2  MCHC 33.9 34.0 33.0 33.3 32.9  RDW 12.6 12.4 12.4 12.6 12.5   LYMPHSABS 0.6* 0.9 0.9 1.0 0.9  MONOABS 0.3 0.7 0.8 0.6 0.7  EOSABS 0.0 0.0 0.0 0.1 0.0  BASOSABS 0.0 0.1 0.1 0.1 0.0    Chemistries  Recent Labs  Lab 10/30/19 2155 11/01/19 0155 11/02/19 0349 11/03/19 0323 11/04/19 0200  NA 140 138 140 137 137  K 4.5 4.0 4.6 4.0 3.7  CL 108 102 102 101 100  CO2 22 26 28 26 23   GLUCOSE 166* 179* 116* 68* 75  BUN 18 22* 22* 15 14  CREATININE 0.90 0.95 1.01 0.91 1.06  CALCIUM 8.8* 8.7* 9.1 8.7* 9.2  MG  --  2.1 2.1 1.9 2.2  AST 40 26 26 27 27   ALT 36 32 32 30 29  ALKPHOS 58 54 57 55 59  BILITOT 1.0 0.9 0.8 0.8 0.8   ------------------------------------------------------------------------------------------------------------------ No results for input(s): CHOL, HDL, LDLCALC, TRIG, CHOLHDL, LDLDIRECT in the last 72 hours.  Lab Results  Component Value Date   HGBA1C 6.6 (H) 11/01/2019   ------------------------------------------------------------------------------------------------------------------ No results for input(s): TSH, T4TOTAL, T3FREE, THYROIDAB in the last 72 hours.  Invalid input(s): FREET3 ------------------------------------------------------------------------------------------------------------------ Recent Labs    11/03/19 0323 11/04/19 0200  FERRITIN 459* 536*  Coagulation profile No results for input(s): INR, PROTIME in the last 168 hours.  Recent Labs    11/03/19 0323 11/04/19 0200  DDIMER 1.37* 0.94*    Cardiac Enzymes No results for input(s): CKMB, TROPONINI, MYOGLOBIN in the last 168 hours.  Invalid input(s): CK ------------------------------------------------------------------------------------------------------------------    Component Value Date/Time   BNP 255.5 (H) 10/30/2019 2155    Micro Results No results found for this or any previous visit (from the past 240 hour(s)).  Radiology Reports No results found.

## 2019-11-04 NOTE — Progress Notes (Signed)
Physical Therapy Treatment Patient Details Name: Brandon Gallagher MRN: [id number] DOB: Jun 16, 1961 Today's Date: 11/04/2019    History of Present Illness Patient is a 58 y.o. male with PMHx of IBS, IgA deficiency, recurrent diverticulitis , anxiety, GERD who had tested positive  for Covid on 12/6 .presented to [facility] ED on 12/17 with fever, chills, cough and worsening shortness of breath, abdominal pain, had a CT scan of the abdomen on 12/14 . IN ED found to have hypoxia, sinus bradycardia.    PT Comments    The patient  Demonstrates decreased balance during ambulation and currently should use RW. Patient has H/O neuropathy. Patient ambulated x 100' on 3 L with SPO2 92%, desautration on RA.  Monitored on finger, ear picks up better but does not stay intact.  Patient asking for transportation assistance to go home. MSW notified.   Patient was receiving HHPT PTA., recommend to continue due to impaired balance.   Follow Up Recommendations  Home health PT;Supervision/Assistance - 24 hour     Equipment Recommendations  None recommended by PT(may want to consider rollator  soon)    Recommendations for Other Services       Precautions / Restrictions Precautions Precautions: Fall Precaution Comments: sats, needs his shoes, neuropathy,    Mobility  Bed Mobility               General bed mobility comments: in recliner  Transfers Overall transfer level: Needs assistance Equipment used: Rolling walker (2 wheeled) Transfers: Sit to/from Stand Sit to Stand: Min guard         General transfer comment: patient impulsive, decreased control to stand, just shoots up quickly, then off balance, steady assit. Improved on second time stood from recliner.  Ambulation/Gait Ambulation/Gait assistance: Min guard;Min assist Gait Distance (Feet): 100 Feet(then 50) Assistive device: Rolling walker (2 wheeled) Gait Pattern/deviations: Step-to pattern;Step-through pattern;Staggering  left;Staggering right Gait velocity: decr Gait velocity interpretation: <1.8 ft/sec, indicate of risk for recurrent falls General Gait Details: gait unsteady, decreased control of feet at times, narrow base, has neuropathy   Stairs             Wheelchair Mobility    Modified Rankin (Stroke Patients Only)       Balance Overall balance assessment: Needs assistance   Sitting balance-Leahy Scale: Good     Standing balance support: Bilateral upper extremity supported;During functional activity Standing balance-Leahy Scale: Poor Standing balance comment: needs support                            Cognition Arousal/Alertness: Awake/alert Behavior During Therapy: WFL for tasks assessed/performed;Anxious;Impulsive                                   General Comments: patient is  tremulous, moves quickly      Exercises      General Comments        Pertinent Vitals/Pain Pain Assessment: No/denies pain    Home Living                      Prior Function            PT Goals (current goals can now be found in the care plan section) Progress towards PT goals: Progressing toward goals    Frequency    Min 3X/week      PT Plan Current plan  remains appropriate    Co-evaluation              AM-PAC PT "6 Clicks" Mobility   Outcome Measure  Help needed turning from your back to your side while in a flat bed without using bedrails?: None Help needed moving from lying on your back to sitting on the side of a flat bed without using bedrails?: None Help needed moving to and from a bed to a chair (including a wheelchair)?: A Little Help needed standing up from a chair using your arms (e.g., wheelchair or bedside chair)?: A Little Help needed to walk in hospital room?: A Little Help needed climbing 3-5 steps with a railing? : A Lot 6 Click Score: 19    End of Session Equipment Utilized During Treatment: Oxygen Activity  Tolerance: Patient tolerated treatment well Patient left: in chair;with call bell/phone within reach Nurse Communication: Mobility status PT Visit Diagnosis: Unsteadiness on feet (R26.81);Difficulty in walking, not elsewhere classified (R26.2)     Time: 1121-6244 PT Time Calculation (min) (ACUTE ONLY): 43 min  Charges:  $Gait Training: 23-37 mins $Self Care/Home Management: [place] Pager [phone] Office [phone]    Claretha Cooper 11/04/2019, 5:02 PM

## 2019-11-04 NOTE — Progress Notes (Addendum)
Patient seen and assessed. Patient found sitting in bedside chair. No acute distress noted. Assessment completed via computerized charting per [place]. Informed patient of upcoming transfer to room 317. Verbalized understanding. Inquiry made by staff nurse if telephone calls need to be placed for updates to family regarding patient condition. Patient declines request.   Unit to Unit report: 11/04/19 @ 2131: Attempt to call 3rd floor to given staff nurse [phone]). unsuccessful. Will try later  Unit to Unit report: 11/04/19 @ 2155:Second attempt to call 3 rd floor to give report successful. Will transfer patient upstair in bed

## 2019-11-04 NOTE — Progress Notes (Signed)
Patient set up in chair most of the day reading a book and watching tv, no significant changes. Still desats on exertion but goes back in low 90s at rest. Will continue to monitor.

## 2019-11-04 NOTE — Progress Notes (Signed)
Late Entry for 11/03/19 @ 2120: Patient seen and assessed. Physical assessment completed via computerized charting per [facility] policy. No acute distress noted. 200 ml urine emptied from urinal. Staff nurse ask if there are any family members that need to be called for updates patient states there are no family members to update. Side rails up x 2. Bed in lowest position and locked. Call light in reach. Continue to monitor

## 2019-11-05 LAB — D-DIMER, QUANTITATIVE: D-Dimer, Quant: 0.46 ug/mL-FEU (ref 0.00–0.50)

## 2019-11-05 LAB — CBC WITH DIFFERENTIAL/PLATELET
Abs Immature Granulocytes: 0.6 10*3/uL — ABNORMAL HIGH (ref 0.00–0.07)
Basophils Absolute: 0.1 10*3/uL (ref 0.0–0.1)
Basophils Relative: 1 %
Eosinophils Absolute: 0 10*3/uL (ref 0.0–0.5)
Eosinophils Relative: 0 %
HCT: 48.1 % (ref 39.0–52.0)
Hemoglobin: 15.8 g/dL (ref 13.0–17.0)
Immature Granulocytes: 6 %
Lymphocytes Relative: 7 %
Lymphs Abs: 0.8 10*3/uL (ref 0.7–4.0)
MCH: 30 pg (ref 26.0–34.0)
MCHC: 32.8 g/dL (ref 30.0–36.0)
MCV: 91.3 fL (ref 80.0–100.0)
Monocytes Absolute: 0.7 10*3/uL (ref 0.1–1.0)
Monocytes Relative: 6 %
Neutro Abs: 8.7 10*3/uL — ABNORMAL HIGH (ref 1.7–7.7)
Neutrophils Relative %: 80 %
Platelets: 354 10*3/uL (ref 150–400)
RBC: 5.27 MIL/uL (ref 4.22–5.81)
RDW: 12.6 % (ref 11.5–15.5)
WBC: 10.9 10*3/uL — ABNORMAL HIGH (ref 4.0–10.5)
nRBC: 0 % (ref 0.0–0.2)

## 2019-11-05 LAB — COMPREHENSIVE METABOLIC PANEL
ALT: 29 U/L (ref 0–44)
AST: 22 U/L (ref 15–41)
Albumin: 2.9 g/dL — ABNORMAL LOW (ref 3.5–5.0)
Alkaline Phosphatase: 59 U/L (ref 38–126)
Anion gap: 10 (ref 5–15)
BUN: 17 mg/dL (ref 6–20)
CO2: 25 mmol/L (ref 22–32)
Calcium: 9.2 mg/dL (ref 8.9–10.3)
Chloride: 101 mmol/L (ref 98–111)
Creatinine, Ser: 1 mg/dL (ref 0.61–1.24)
GFR calc Af Amer: >60 mL/min (ref >60)
GFR calc non Af Amer: >60 mL/min (ref >60)
Glucose, Bld: 107 mg/dL — ABNORMAL HIGH (ref 70–99)
Potassium: 5 mmol/L (ref 3.5–5.1)
Sodium: 136 mmol/L (ref 135–145)
Total Bilirubin: 0.7 mg/dL (ref 0.3–1.2)
Total Protein: 6.3 g/dL — ABNORMAL LOW (ref 6.5–8.1)

## 2019-11-05 LAB — C-REACTIVE PROTEIN: CRP: 2.9 mg/dL — ABNORMAL HIGH (ref <1.0)

## 2019-11-05 LAB — MAGNESIUM: Magnesium: 2.2 mg/dL (ref 1.7–2.4)

## 2019-11-05 LAB — GLUCOSE, CAPILLARY
Glucose-Capillary: 76 mg/dL (ref 70–99)
Glucose-Capillary: 140 mg/dL — ABNORMAL HIGH (ref 70–99)

## 2019-11-05 LAB — LEGIONELLA PNEUMOPHILA SEROGP 1 UR AG: L. pneumophila Serogp 1 Ur Ag: NEGATIVE

## 2019-11-05 LAB — PHOSPHORUS: Phosphorus: 3.1 mg/dL (ref 2.5–4.6)

## 2019-11-05 LAB — FERRITIN: Ferritin: 461 ng/mL — ABNORMAL HIGH (ref 24–336)

## 2019-11-05 MED ORDER — METHIMAZOLE 5 MG PO TABS: 5.0000 mg | ORAL_TABLET | Freq: Two times a day (BID) | ORAL | 0 refills | Status: DC

## 2019-11-05 MED ORDER — METHYLPREDNISOLONE 4 MG PO TBPK: ORAL_TABLET | ORAL | 0 refills | Status: DC

## 2019-11-05 MED ORDER — METHYLPREDNISOLONE SODIUM SUCC 40 MG IJ SOLR
10.0000 mg | Freq: Every day | INTRAMUSCULAR | Status: DC
  Administered 2019-11-05: 10 mg via INTRAVENOUS
  Filled 2019-11-05: qty 1

## 2019-11-05 MED ORDER — METRONIDAZOLE 500 MG PO TABS: 500.0000 mg | ORAL_TABLET | Freq: Three times a day (TID) | ORAL | 0 refills | Status: AC

## 2019-11-05 MED ORDER — FUROSEMIDE 10 MG/ML IJ SOLN
20.0000 mg | Freq: Once | INTRAMUSCULAR | Status: AC
  Administered 2019-11-05: 20 mg via INTRAVENOUS
  Filled 2019-11-05: qty 2

## 2019-11-05 NOTE — Discharge Instructions (Signed)
Follow with Primary MD Nicoletta Dress, MD in 7 days   Get CBC, CMP, 2 view Chest X ray -  checked next visit within 1 week by Primary MD    Activity: As tolerated with Full fall precautions use walker/cane & assistance as needed  Disposition Home    Diet: Heart Healthy    Special Instructions: If you have smoked or chewed Tobacco  in the last 2 yrs please stop smoking, stop any regular Alcohol  and or any Recreational drug use.  On your next visit with your primary care physician please Get Medicines reviewed and adjusted.  Please request your Prim.MD to go over all Hospital Tests and Procedure/Radiological results at the follow up, please get all Hospital records sent to your Prim MD by signing hospital release before you go home.  If you experience worsening of your admission symptoms, develop shortness of breath, life threatening emergency, suicidal or homicidal thoughts you must seek medical attention immediately by calling 911 or calling your MD immediately  if symptoms less severe.        Person Under Monitoring Name: Brandon Gallagher  Location: [place] [address]   Infection Prevention Recommendations for Individuals Confirmed to have, or Being Evaluated for, 2019 Novel Coronavirus (COVID-19) Infection Who Receive Care at Home  Individuals who are confirmed to have, or are being evaluated for, COVID-19 should follow the prevention steps below until a healthcare provider or local or state health department says they can return to normal activities.  Stay home except to get medical care You should restrict activities outside your home, except for getting medical care. Do not go to work, school, or public areas, and do not use public transportation or taxis.  Call ahead before visiting your doctor Before your medical appointment, call the healthcare provider and tell them that you have, or are being evaluated for, COVID-19 infection. This will  help the healthcare providers office take steps to keep other people from getting infected. Ask your healthcare provider to call the local or state health department.  Monitor your symptoms Seek prompt medical attention if your illness is worsening (e.g., difficulty breathing). Before going to your medical appointment, call the healthcare provider and tell them that you have, or are being evaluated for, COVID-19 infection. Ask your healthcare provider to call the local or state health department.  Wear a facemask You should wear a facemask that covers your nose and mouth when you are in the same room with other people and when you visit a healthcare provider. People who live with or visit you should also wear a facemask while they are in the same room with you.  Separate yourself from other people in your home As much as possible, you should stay in a different room from other people in your home. Also, you should use a separate bathroom, if available.  Avoid sharing household items You should not share dishes, drinking glasses, cups, eating utensils, towels, bedding, or other items with other people in your home. After using these items, you should wash them thoroughly with soap and water.  Cover your coughs and sneezes Cover your mouth and nose with a tissue when you cough or sneeze, or you can cough or sneeze into your sleeve. Throw used tissues in a lined trash can, and immediately wash your hands with soap and water for at least 20 seconds or use an alcohol-based hand rub.  Wash your [facility] your hands often and thoroughly  with soap and water for at least 20 seconds. You can use an alcohol-based hand sanitizer if soap and water are not available and if your hands are not visibly dirty. Avoid touching your eyes, nose, and mouth with unwashed hands.   Prevention Steps for Caregivers and Household Members of Individuals Confirmed to have, or Being Evaluated for, COVID-19  Infection Being Cared for in the Home  If you live with, or provide care at home for, a person confirmed to have, or being evaluated for, COVID-19 infection please follow these guidelines to prevent infection:  Follow healthcare providers instructions Make sure that you understand and can help the patient follow any healthcare provider instructions for all care.  Provide for the patients basic needs You should help the patient with basic needs in the home and provide support for getting groceries, prescriptions, and other personal needs.  Monitor the patients symptoms If they are getting sicker, call his or her medical provider and tell them that the patient has, or is being evaluated for, COVID-19 infection. This will help the healthcare providers office take steps to keep other people from getting infected. Ask the healthcare provider to call the local or state health department.  Limit the number of people who have contact with the patient  If possible, have only one caregiver for the patient.  Other household members should stay in another home or place of residence. If this is not possible, they should stay  in another room, or be separated from the patient as much as possible. Use a separate bathroom, if available.  Restrict visitors who do not have an essential need to be in the home.  Keep older adults, very young children, and other sick people away from the patient Keep older adults, very young children, and those who have compromised immune systems or chronic health conditions away from the patient. This includes people with chronic heart, lung, or kidney conditions, diabetes, and cancer.  Ensure good ventilation Make sure that shared spaces in the home have good air flow, such as from an air conditioner or an opened window, weather permitting.  Wash your hands often  Wash your hands often and thoroughly with soap and water for at least 20 seconds. You can use an  alcohol based hand sanitizer if soap and water are not available and if your hands are not visibly dirty.  Avoid touching your eyes, nose, and mouth with unwashed hands.  Use disposable paper towels to dry your hands. If not available, use dedicated cloth towels and replace them when they become wet.  Wear a facemask and gloves  Wear a disposable facemask at all times in the room and gloves when you touch or have contact with the patients blood, body fluids, and/or secretions or excretions, such as sweat, saliva, sputum, nasal mucus, vomit, urine, or feces.  Ensure the mask fits over your nose and mouth tightly, and do not touch it during use.  Throw out disposable facemasks and gloves after using them. Do not reuse.  Wash your hands immediately after removing your facemask and gloves.  If your personal clothing becomes contaminated, carefully remove clothing and launder. Wash your hands after handling contaminated clothing.  Place all used disposable facemasks, gloves, and other waste in a lined container before disposing them with other household waste.  Remove gloves and wash your hands immediately after handling these items.  Do not share dishes, glasses, or other household items with the patient  Avoid sharing household items. You  should not share dishes, drinking glasses, cups, eating utensils, towels, bedding, or other items with a patient who is confirmed to have, or being evaluated for, COVID-19 infection.  After the person uses these items, you should wash them thoroughly with soap and water.  Wash laundry thoroughly  Immediately remove and wash clothes or bedding that have blood, body fluids, and/or secretions or excretions, such as sweat, saliva, sputum, nasal mucus, vomit, urine, or feces, on them.  Wear gloves when handling laundry from the patient.  Read and follow directions on labels of laundry or clothing items and detergent. In general, wash and dry with the  warmest temperatures recommended on the label.  Clean all areas the individual has used often  Clean all touchable surfaces, such as counters, tabletops, doorknobs, bathroom fixtures, toilets, phones, keyboards, tablets, and bedside tables, every day. Also, clean any surfaces that may have blood, body fluids, and/or secretions or excretions on them.  Wear gloves when cleaning surfaces the patient has come in contact with.  Use a diluted bleach solution (e.g., dilute bleach with 1 part bleach and 10 parts water) or a household disinfectant with a label that says EPA-registered for coronaviruses. To make a bleach solution at home, add 1 tablespoon of bleach to 1 quart (4 cups) of water. For a larger supply, add  cup of bleach to 1 gallon (16 cups) of water.  Read labels of cleaning products and follow recommendations provided on product labels. Labels contain instructions for safe and effective use of the cleaning product including precautions you should take when applying the product, such as wearing gloves or eye protection and making sure you have good ventilation during use of the product.  Remove gloves and wash hands immediately after cleaning.  Monitor yourself for signs and symptoms of illness Caregivers and household members are considered close contacts, should monitor their health, and will be asked to limit movement outside of the home to the extent possible. Follow the monitoring steps for close contacts listed on the symptom monitoring form.   ? If you have additional questions, contact your local health department or call the epidemiologist on call at [phone] (available 24/7). ? This guidance is subject to change. For the most up-to-date guidance from [facility], please refer to their website: [email or web address]

## 2019-11-05 NOTE — Discharge Summary (Signed)
Brandon Gallagher AYT:[id number] DOB: 1961/08/18 DOA: 10/30/2019  PCP: Brandon Dress, MD  Admit date: 10/30/2019  Discharge date: 11/05/2019  Admitted From: Home   Disposition:  Home   Recommendations for Outpatient Follow-up:   Follow up with PCP in 1-2 weeks  PCP Please obtain BMP/CBC, 2 view CXR in 1week,  (see Discharge instructions)   PCP Please follow up on the following pending results: Follow TSH, free T4 and T3.  Outpatient endocrine follow-up.   Home Health: PT   Equipment/Devices: 2lit o2 PRN  Consultations: None  Discharge Condition: Stable    CODE STATUS: Full    Diet Recommendation: Heart Healthy   Chief complaint.  Shortness of breath  Brief history of present illness from the day of admission and additional interim summary    Patient is a 58 y.o. male with PMHx of IBS, IgA deficiency, recurrent diverticulitis (claims he just finished a course of antimicrobial therapy approximately 2 weeks back), anxiety, GERD who had tested positive  for Covid on 12/6 (at [facility] the [facility] care everywhere).presented to [facility] ED on 12/17 with fever, chills, cough and worsening shortness of breath.  He was found to have acute hypoxic respiratory failure along with sinus bradycardia-subsequently transferred to [facility] for further evaluation and treatment.   He claims that he is started having  recurrent left lower quadrant abdominal pain that is very reminiscent of his diverticulitis on 12/14-and had a CT scan of the abdomen on 12/14 at [facility] that did not show any active diverticulitis.   Significant events: 12/6>> Covid positive (at [place] of the [facility] care everywhere) 12/14>> recurrent left lower quadrant pain-concern for diverticulitis-negative CT  abd [facility] 12/17>> presented at [facility] ED with shortness of breath and cough 1217>> CT angio chest at [facility]-negative for PE, but severe bilateral patchy/groundglass infiltrates.                                                                 Hospital Course    Acute Hypoxic Resp Failure due to Covid 19 Viral pneumonia and concurrent bacterial pneumonia (per H&P procalcitonin elevated at [facility]): he had moderate disease in initially was on about 5 L nasal cannula oxygen, he was treated with IV steroids and remdesivir with good improvement, he is now down to 1 L nasal cannula oxygen at rest and 2 L on ambulation and symptom-free, will get steroid taper at home orally and be discharged, follow with PCP.    Recent Labs  Lab 10/30/19 2155 11/01/19 0155 11/02/19 0349 11/03/19 0323 11/04/19 0200 11/05/19 0005  CRP 5.0* 2.6* 1.1* 1.4* 6.0* 2.9*  DDIMER 1.59* 1.16* 1.07* 1.37* 0.94* 0.46  FERRITIN  --  502* 436* 459* 536* 461*  BNP 255.5*  --   --   --   --   --  PROCALCITON 0.12 <0.10 <0.10  --   --   --     Hepatic Function Latest Ref Rng & Units 11/05/2019 11/04/2019 11/03/2019  Total Protein 6.5 - 8.1 g/dL 6.3(L) 6.7 5.7(L)  Albumin 3.5 - 5.0 g/dL 2.9(L) 3.1(L) 2.7(L)  AST 15 - 41 U/L 22 27 27   ALT 0 - 44 U/L 29 29 30   Alk Phosphatase 38 - 126 U/L 59 59 55  Total Bilirubin 0.3 - 1.2 mg/dL 0.7 0.8 0.8     Mild hyperthyroidism: Free T4 is low, will start on low-dose methimazole.   Results for Brandon Gallagher" (MRN [id number]) as of 11/03/2019 12:34  Ref. Range 10/30/2019 22:40  TSH Latest Ref Range: 0.350 - 4.500 uIU/mL 0.308 (L)  T4,Free(Direct) Latest Ref Range: 0.61 - 1.12 ng/dL 1.16 (H)     Recurrent diverticulitis: He had mild clinical diagnosis of diverticulitis this admission, imaging and exam stable, now stable and symptom-free on oral Flagyl which I will give him for 5 more days, once discharged follow-up with his GI physician. Per patient-he follows  with Brandon Gallagher is advised surgery/colectomy given the recurrent nature of his diverticulitis.   GERD: Continue PPI/H2 blocker  IgA deficiency: Follows with allergist-reviewed outpatient notes-looks like patient was not felt to require immunoglobulin infusion-due to significant antibody response to the Pneumovax vaccine.  He does have a history of recurrent sinus and pulmonary infections.  Obesity: BMI 35, follow up PCP.   Discharge diagnosis     Principal Problem:   Acute respiratory disease due to COVID-19 virus Active Problems:   GERD (gastroesophageal reflux disease)   Acute respiratory failure with hypoxia (HCC)   IgA deficiency (HCC)   Sinus bradycardia by electrocardiogram    Discharge instructions    Discharge Instructions    Diet - low sodium heart healthy   Complete by: As directed    Discharge instructions   Complete by: As directed    Follow with Primary MD Brandon Dress, MD in 7 days   Get CBC, CMP, 2 view Chest X ray -  checked next visit within 1 week by Primary MD    Activity: As tolerated with Full fall precautions use walker/cane & assistance as needed  Disposition Home    Diet: Heart Healthy    Special Instructions: If you have smoked or chewed Tobacco  in the last 2 yrs please stop smoking, stop any regular Alcohol  and or any Recreational drug use.  On your next visit with your primary care physician please Get Medicines reviewed and adjusted.  Please request your Prim.MD to go over all Hospital Tests and Procedure/Radiological results at the follow up, please get all Hospital records sent to your Prim MD by signing hospital release before you go home.  If you experience worsening of your admission symptoms, develop shortness of breath, life threatening emergency, suicidal or homicidal thoughts you must seek medical attention immediately by calling 911 or calling your MD immediately  if symptoms less severe.   Increase activity slowly    Complete by: As directed    MyChart COVID-19 home monitoring program   Complete by: Nov 05, 2019    Is the patient willing to use the [place] for home monitoring?: Yes   Temperature monitoring   Complete by: Nov 05, 2019    After how many days would you like to receive a notification of this patient's flowsheet entries?: 1      Discharge Medications   Allergies as of  11/05/2019      Reactions   Iodinated Diagnostic Agents    Other reaction(s): NO ALLERGY   Latex    Other reaction(s): NO ALLERGY      Medication List    TAKE these medications   albuterol 108 (90 Base) MCG/ACT inhaler Commonly known as: VENTOLIN HFA Can inhale two puffs every four to six hours as needed for cough or wheeze.   ALPRAZolam 1 MG tablet Commonly known as: XANAX Take 1 mg by mouth 3 (three) times daily as needed for anxiety.   ammonium lactate 12 % lotion Commonly known as: LAC-HYDRIN Apply 1 application topically 2 (two) times daily.   Dexilant 60 MG capsule Generic drug: dexlansoprazole Take 60 mg by mouth daily. Sample med   famotidine 40 MG tablet Commonly known as: PEPCID Take 20 mg by mouth daily.   fluticasone 0.05 % cream Commonly known as: CUTIVATE Apply topically 2 (two) times daily. to rectum. For 10 days   methimazole 5 MG tablet Commonly known as: TAPAZOLE Take 1 tablet (5 mg total) by mouth 2 (two) times daily.   methylPREDNISolone 4 MG Tbpk tablet Commonly known as: MEDROL DOSEPAK follow package directions   metroNIDAZOLE 500 MG tablet Commonly known as: Flagyl Take 1 tablet (500 mg total) by mouth 3 (three) times daily for 5 days.   montelukast 10 MG tablet Commonly known as: SINGULAIR Take 1 tablet (10 mg total) by mouth at bedtime.   NASACORT AQ NA Place 1 spray into the nose 2 (two) times daily.   omeprazole 20 MG capsule Commonly known as: PRILOSEC Take 20 mg by mouth 2 (two) times daily.   Symbicort 160-4.5 MCG/ACT inhaler Generic drug:  budesonide-formoterol Inhale 2 puffs into the lungs 1-2 times daily            Durable Medical Equipment  (From admission, onward)         Start     Ordered   11/05/19 0757  For home use only DME oxygen  Once    Question Answer Comment  Length of Need 6 Months   Mode or (Route) Nasal cannula   Liters per Minute 2   Frequency Continuous (stationary and portable oxygen unit needed)   Oxygen conserving device Yes   Oxygen delivery system Gas      11/05/19 0756          Follow-up Information    Brandon Dress, MD. Schedule an appointment as soon as possible for a visit in 1 week(s).   Specialty: Internal Medicine Why: Also follow-up with your cardiologist and gastroenterologist within 2 weeks Contact information: [place] [address] [phone]           Major procedures and Radiology Reports - PLEASE review detailed and final reports thoroughly  -       No results found.  Micro Results     No results found for this or any previous visit (from the past 240 hour(s)).  Today   Subjective    Carlie Corpus today has no headache,no chest abdominal pain,no new weakness tingling or numbness, feels much better wants to go home today.     Objective   Blood pressure 129/70, pulse 64, temperature 97.9 F (36.6 C), temperature source Oral, resp. rate 18, height 5' 11"  (1.803 m), weight 115.2 kg, SpO2 95 %.   Intake/Output Summary (Last 24 hours) at 11/05/2019 0936 Last data filed at 11/05/2019 0553 Gross per 24 hour  Intake  840 ml  Output 2095 ml  Net -1255 ml    Exam  Awake Alert, Oriented x 3, No new F.N deficits, Normal affect [place].AT,PERRAL Supple Neck,No JVD, No cervical lymphadenopathy appriciated.  Symmetrical Chest wall movement, Good air movement bilaterally, CTAB RRR,No Gallops,Rubs or new Murmurs, No Parasternal Heave +ve B.Sounds, Abd Soft, Non tender, No organomegaly appriciated, No rebound -guarding or  rigidity. No Cyanosis, Clubbing or edema, No new Rash or bruise   Data Review   CBC w Diff:  Lab Results  Component Value Date   WBC 10.9 (H) 11/05/2019   HGB 15.8 11/05/2019   HCT 48.1 11/05/2019   PLT 354 11/05/2019   LYMPHOPCT 7 11/05/2019   MONOPCT 6 11/05/2019   EOSPCT 0 11/05/2019   BASOPCT 1 11/05/2019    CMP:  Lab Results  Component Value Date   NA 136 11/05/2019   K 5.0 11/05/2019   CL 101 11/05/2019   CO2 25 11/05/2019   BUN 17 11/05/2019   CREATININE 1.00 11/05/2019   PROT 6.3 (L) 11/05/2019   ALBUMIN 2.9 (L) 11/05/2019   BILITOT 0.7 11/05/2019   ALKPHOS 59 11/05/2019   AST 22 11/05/2019   ALT 29 11/05/2019  .   Total Time in preparing paper work, data evaluation and todays exam - 40 minutes  Lala Lund M.D on 11/05/2019 at 9:36 AM  [facility]   Office  [phone]

## 2019-11-05 NOTE — Care Management Important Message (Signed)
Important Message  Patient Details  Name: MACEDONIO SCALLON MRN: [id number] Date of Birth: 1961/08/25   Medicare Important Message Given:  Yes - Important Message mailed due to current National Emergency  Verbal consent obtained due to current National Emergency  Relationship to patient: Spouse/Significant Other Contact Name: Xai Frerking Call Date: 11/05/19  Time: 1237 Phone: [phone] Outcome: Spoke with contact Important Message mailed to: Patient address on file    Delorse Lek 11/05/2019, 12:37 PM

## 2019-11-05 NOTE — TOC Transition Note (Signed)
Transition of Care [facility]) - CM/SW Discharge Note   Patient Details  Name: Brandon Gallagher MRN: [id number] Date of Birth: 08-Sep-1961  [facility]) CM/SW Contact:  Weston Anna, LCSW Phone Number: 11/05/2019, 12:55 PM   Clinical Narrative:     Patient set to discharge home today with o2 services being provided by [place] is waiting for confirmation that o2 has been delivered to patients home.   CSW was unable to locate [facility] agency to accept patient. Received denials from the follow agencies: [facility], [facility], [facility], [facility], [facility] and [facility]. CSW notified patient who stated he felt comfortable returning home and would follow up with his PCP if needed.   Final next level of care: Home/Self Care Barriers to Discharge: No Barriers Identified   Patient Goals and CMS Choice        Discharge Placement                       Discharge Plan and Services                DME Arranged: Oxygen DME Agency: [facility] Date DME Agency Contacted: 11/05/19 Time DME Agency Contacted: [phone] Representative spoke with at DME Agency: Magda Paganini [facility] Arranged: NA          Social Determinants of Health ([place]) Interventions     Readmission Risk Interventions No flowsheet data found.

## 2019-11-10 ENCOUNTER — Telehealth: Payer: Self-pay | Admitting: Gastroenterology

## 2019-11-10 ENCOUNTER — Other Ambulatory Visit: Payer: Self-pay | Admitting: Allergy and Immunology

## 2019-11-10 DIAGNOSIS — J4540 Moderate persistent asthma, uncomplicated: Secondary | ICD-10-CM

## 2019-11-10 MED ORDER — MONTELUKAST SODIUM 10 MG PO TABS: 10.0000 mg | ORAL_TABLET | Freq: Every day | ORAL | 5 refills | Status: DC

## 2019-11-10 NOTE — Telephone Encounter (Signed)
Patient notified that Dr. Lyndel Safe plans on referring to a surgeon for "painful diverticular diease" once he recovers from [place].  All questions answered.

## 2019-11-10 NOTE — Telephone Encounter (Signed)
[name] would like refills of SINGULAIR sent to [facility].

## 2019-11-10 NOTE — Telephone Encounter (Signed)
Refills sent in

## 2019-11-11 ENCOUNTER — Ambulatory Visit (INDEPENDENT_AMBULATORY_CARE_PROVIDER_SITE_OTHER): Payer: Medicare Other | Admitting: Podiatry

## 2019-11-11 ENCOUNTER — Other Ambulatory Visit: Payer: Self-pay | Admitting: Podiatry

## 2019-11-11 ENCOUNTER — Telehealth: Payer: Self-pay

## 2019-11-11 ENCOUNTER — Telehealth: Payer: Self-pay | Admitting: Gastroenterology

## 2019-11-11 DIAGNOSIS — Z5329 Procedure and treatment not carried out because of patient's decision for other reasons: Secondary | ICD-10-CM

## 2019-11-11 DIAGNOSIS — M79671 Pain in right foot: Secondary | ICD-10-CM

## 2019-11-11 NOTE — Telephone Encounter (Signed)
Patient called. He can't get anyone on the phone in [place]. He has questions about his appointment and if it can be a tele-visit. Please call to advise Lollie Marrow gave him your name Thanks Nira Conn

## 2019-11-11 NOTE — Telephone Encounter (Signed)
See alternate note  

## 2019-11-11 NOTE — Telephone Encounter (Signed)
Pt inquired "what to put in the hot bath" that Dr. Lyndel Safe had recommended.

## 2019-11-11 NOTE — Progress Notes (Signed)
No show, no call.

## 2019-11-11 NOTE — Telephone Encounter (Signed)
Brandon Gallagher upset that he is still not scheduled for his surgeon appointment. States he wants this appointment as soon as possible. Wanted to speak to a manager to complain about how long the process has been to get scheduled with surgeon. It looks like he had been admitted at the hospital and was positive for COVID. I explained that he needs to recover before the surgeon will see him. He still wanted a message sent to Dr Lyndel Safe asking for this appointment to be scheduled as soon as possible. He believe that the surgeon closer to where he lives might be able to see him sooner but he was unsure of the name of that surgeon or practice.

## 2019-11-11 NOTE — Telephone Encounter (Signed)
Dr Lyndel Safe please advise timing of appt with surgeon.

## 2019-11-12 ENCOUNTER — Telehealth: Payer: Self-pay | Admitting: Gastroenterology

## 2019-11-12 DIAGNOSIS — J1289 Other viral pneumonia: Secondary | ICD-10-CM | POA: Diagnosis not present

## 2019-11-12 DIAGNOSIS — T8789 Other complications of amputation stump: Secondary | ICD-10-CM | POA: Diagnosis not present

## 2019-11-12 DIAGNOSIS — J189 Pneumonia, unspecified organism: Secondary | ICD-10-CM | POA: Diagnosis not present

## 2019-11-12 DIAGNOSIS — K649 Unspecified hemorrhoids: Secondary | ICD-10-CM

## 2019-11-12 DIAGNOSIS — J9601 Acute respiratory failure with hypoxia: Secondary | ICD-10-CM | POA: Diagnosis not present

## 2019-11-12 DIAGNOSIS — K5792 Diverticulitis of intestine, part unspecified, without perforation or abscess without bleeding: Secondary | ICD-10-CM | POA: Diagnosis not present

## 2019-11-12 DIAGNOSIS — U071 COVID-19: Secondary | ICD-10-CM | POA: Diagnosis not present

## 2019-11-12 MED ORDER — HYDROCORTISONE ACETATE 25 MG RE SUPP: 25.0000 mg | Freq: Two times a day (BID) | RECTAL | 1 refills | Status: DC | PRN

## 2019-11-12 NOTE — Telephone Encounter (Signed)
Left message for patient to call back to the office;  

## 2019-11-12 NOTE — Telephone Encounter (Signed)
Please review previous message for information

## 2019-11-12 NOTE — Telephone Encounter (Signed)
Looks like he is having painful diverticular disease.  Please make appointment with -Dr. Blaine Hamper or Dr Coralie Keens in [place] for possible consideration for left hemicolectomy (if indicated).  As soon as available. -Would like to hold off on antibiotics since she has multiple courses previously. -For the time being do low fiber diet until pain is better, then lets do high-fiber diet once pain gets better. -Check CBC, CMP, CRP. -If he is having diarrhea, need to check stool studies for GI pathogens as well.  RG

## 2019-11-12 NOTE — Telephone Encounter (Addendum)
Called and spoke with patient= -still having abd pain "just staying in the spot where the diverticulitis is";  -has more strength and energy since having Covid and "feels more like himself";  -wanting to know which surgeon he is needing to be referred to, preferably in [place];  Educated patient concerning sitz bath and increased fluids; -requesting to know what kind of diet should he be eating (high fiber, low fiber, liquids?) Please advise of next step in patient's plan of care;

## 2019-11-12 NOTE — Telephone Encounter (Signed)
Patient called. Return call made. Patient having complaints of pain from hemorrhoids. Patient having Fluticasone cream only being used on the perineum and not inside (not been placing to first knuckle). Sitz bathes difficult as he is a fall risk but trying with his wife around. Having LLQ abdominal discomfort that he says feels as if it could be diverticulitis but not clear if it is (has recently been treated with ABx x2 but no new imaging since his COVID19 infection and admission. He is asking for his referral to colorectal surgery.  I have asked the patient this AM to use his Fluticasone up to the first knuckle and also the outside perineum. I am going to send Anusol suppositories to be used 1-2 times daily for the next week and then QHS for the next 2-weeks. He will hold the Fluticasone during this time. I am not clear if this is diverticulitis, but I do think that it may guide things to have an appointment to talk with patient further and if symptoms reminiscent or seem diverticular in nature then consider a CT scan +/- another round of antibiotics but this would be 3 courses of antibiotics and it looks like more recent CT in November had shown improvement from what I can read in Telephone notes  Looks like patients was to be referred to [facility] in a Telephone note for surgery, but not clear who the surgeon was that I can find quickly.  We will begin Anusol and I'll send this message to Dr. Lyndel Safe and his team to reach out to patient later this AM to further clarify and get referral moving (although telephone messaging had suggested things may have already begun with the referral process).  Query CT scan needs if LLQ is so significant, but seems hemorrhoidal pain is worse currently.  Justice Britain, MD [facility] Office # [phone]

## 2019-11-13 NOTE — Telephone Encounter (Signed)
Referral has been faxed to Dr. Noberto Retort in [place]-awaiting response at this time;

## 2019-11-14 HISTORY — PX: OTHER SURGICAL HISTORY: SHX169

## 2019-11-17 NOTE — Telephone Encounter (Signed)
Staff message sent to [facility] for referral appt date/time;-awaiting response;

## 2019-11-17 NOTE — Telephone Encounter (Signed)
Called and spoke with patient-patient reports the sx's office has already made an appt for the middle of Jan- Patient reports: -sitz baths "helping a little while I am in the tub" -eating lots of fiber -stools are still loose (1-2 times per day BM's) -hemorrhoidal creams NOT helping  Please advise

## 2019-11-18 ENCOUNTER — Telehealth (INDEPENDENT_AMBULATORY_CARE_PROVIDER_SITE_OTHER): Payer: Medicare Other | Admitting: Gastroenterology

## 2019-11-18 ENCOUNTER — Other Ambulatory Visit: Payer: Self-pay

## 2019-11-18 VITALS — Ht 71.0 in | Wt 242.0 lb

## 2019-11-18 DIAGNOSIS — K649 Unspecified hemorrhoids: Secondary | ICD-10-CM | POA: Diagnosis not present

## 2019-11-18 DIAGNOSIS — R109 Unspecified abdominal pain: Secondary | ICD-10-CM | POA: Diagnosis not present

## 2019-11-18 NOTE — Patient Instructions (Signed)
If you are age 60 or older, your body mass index should be between 23-30. Your Body mass index is 33.75 kg/m. If this is out of the aforementioned range listed, please consider follow up with your Primary Care Provider.  If you are age 9 or younger, your body mass index should be between 19-25. Your Body mass index is 33.75 kg/m. If this is out of the aformentioned range listed, please consider follow up with your Primary Care Provider.   Keep appointment with Dr. Glynda Jaeger.   Increase water intake  High Fiber Diet.   Thank you,  Dr. Jackquline Denmark

## 2019-11-18 NOTE — Progress Notes (Signed)
With recurrent diverticulitis.

## 2019-11-18 NOTE — Progress Notes (Signed)
Chief Complaint: FU  Referring Provider:  Nicoletta Dress, MD      ASSESSMENT AND PLAN;   #1.  Chronic LLQ pain likely d/t painful diverticular disease. Neg colon 02/2018 (Dr Melina Copa), neg CT AP 10/2019. FS 06/2019 small colon polyps (TAs) s/p polypectomy, mod sig div, small int hemorrhoids. Random colonic Bx did show lymphocytic colitis.  Diarrhea completely resolved.  #2.  IBS with predom constipation. Occ diarrhea.  Random colon Bx previously- lymphocytic colitis.  Currently with constipation.  #3.  Recurrent hoids. Failed medical treatment.  #4. GERD with dysphagia d/t distal eso stricture/presbyesophagus s/p dil 06/2019 with resolution of dysphagia. H/O food impaction S/P endoscopic disimpaction 04/2016. Eso Bx- neg for EoE. SB Bx neg for celiac  #5. H/O colonic polyps. Next colon due 02/2021.  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 Covid 19 pneumonia (adm to [facility]) 12/17 to 11/05/2019.  Plan: -Has appt with Dr Noberto Retort (surgery) for 2 reasons: Failed medical treatment for Hoids (for possible EUA with hemorrhoidectomy) and chronic LLQ pain thought to be d/t painful diverticular disease. -Increase water intake. -High Fiber Diet.    HPI:    Brandon Gallagher is a 59 y.o. male  FU Recently admitted to [facility] from 10/30/2019 to 11/05/2019 due to Covid pneumonia with concurrent bacterial pneumonia with acute hypoxic respiratory failure.  Treated with oxygen, IV steroids, remdesivir with good improvement.  No intubation.  He does feel better.  Has appointment with Dr Noberto Retort 11/11 11:30 AM for  -Recurrent hemorrhoidal problems -Recurrent LLQ abdo pain thought to be d/t painful diverticular disease, despite multiple courses of antibiotics, and multiple CTs.  Occ diarrhea and constipation.  More constipation with "chunks of stool". But have to strain. Has been on high fiber diet with fiber supplements/HC cream and suppositories,Fluticasone,  sitz bath's.  No nonsteroidals.  No sodas.  No chocolates mints.  SH-wife Brandon Gallagher works with Dr. Delena Bali  Past GI procedures: -Colonoscopy 10/22/2017 ([facility])-colonic polyps (17) status post polypectomy (Bx- TA), moderate predominantly sigmoid diverticulosis.  Random colonic biopsies-mild lymphocytic colitis.  Refused genetic test. Colonoscopy 2019 (Dr. Melina Copa)- 4 small polyps measuring 4 to 6 mm s/p  Polypectomy. Bx- TAs.  Repeat in 3 years. -EGD 06/2019-distal esophageal stricture S/P dilatation, negative small bowel biopsies, gastric polyps-benign.  06/08/2016-distal esophageal stricture s/p esophageal dilatation (54Fr), presbyesophagus, mild gastritis. Bx- neg for EoE, neg SB Bx. H/O food impaction s/p endoscopic disimpaction 04/15/2016 -CT AP with contrast 05/14/2019: Diverticulosis without diverticulitis, mild hepatic steatosis.  Otherwise normal. Past Medical History:  Diagnosis Date  . Allergy   . Anxiety   . Arthritis   . Asthma   . Blood transfusion without reported diagnosis   . Bronchitis    last had back in 2017   Sees no pulmonary md  . COPD (chronic obstructive pulmonary disease) ([place])   . Depression   . Diverticulosis    last flare up was in Jan. 2018  Was on course of antibiotics for this  . Esophagus disorder    narrowing of esophagus.  Had to have dilatation 09/2016  . Family history of adverse reaction to anesthesia    son had severe N/V with his knee surgery  . GERD (gastroesophageal reflux disease)   . Headache   . History of Clostridioides difficile colitis   . History of colon polyps   . IBS (irritable bowel syndrome)   . Sleep apnea    no CPAP- unsure if true diagnosis or  not    Past Surgical History:  Procedure Laterality Date  . ANTERIOR CERVICAL DECOMP/DISCECTOMY FUSION N/A 01/01/2017   Procedure: Cervical four- five and Cervical six- seven Anterior cervical discectomy with fusion and plate fixation;  Surgeon: Kevan Ny Ditty, MD;  Location: [place];  Service:  Neurosurgery;  Laterality: N/A;  C4-5 and C6-7 Anterior cervical discectomy with fusion and plate fixation  . BACK SURGERY     x 2    @ [facility]        Last one 09/2015  . CARDIAC CATHETERIZATION     He thinks.   Not really sure where at, thought it was here @ [facility], but its been a LONG time ?2000  . COLONOSCOPY  10/22/2017   Colonic polyp status post polypectomy. Moderate predominantly sigmoid diverticulosis.   . COLONOSCOPY  2019   Dr Orlena Sheldon  . ESOPHAGOGASTRODUODENOSCOPY  06/08/2016   Distal esophageal stricture status post esophageal dilatation. Presbtesophagus, also suggestive of motility disorder. Mild gastritis.   Marland Kitchen NASAL SINUS SURGERY     HAS HAD 2 OR 3.   LAST ONE 2011, IN [place]  (LEFT NASAL PASSAGE IS NARROWER THAN RIGHT  . UPPER GASTROINTESTINAL ENDOSCOPY      Family History  Problem Relation Age of Onset  . Heart disease Mother   . Stroke Father   . Colon cancer Neg Hx   . Esophageal cancer Neg Hx   . Stomach cancer Neg Hx   . Diabetes Neg Hx   . Rectal cancer Neg Hx     Social History   Tobacco Use  . Smoking status: Former Smoker    Packs/day: 1.00    Years: 30.00    Pack years: 30.00    Types: Cigarettes    Quit date: 05/12/2000    Years since quitting: 19.5  . Smokeless tobacco: Never Used  Substance Use Topics  . Alcohol use: No    Alcohol/week: 0.0 standard drinks  . Drug use: Not Currently    Current Outpatient Medications  Medication Sig Dispense Refill  . albuterol (VENTOLIN HFA) 108 (90 Base) MCG/ACT inhaler Can inhale two puffs every four to six hours as needed for cough or wheeze. (Patient not taking: Reported on 11/17/2019) 18 g 1  . ALPRAZolam (XANAX) 1 MG tablet Take 1 mg by mouth 3 (three) times daily as needed for anxiety.     Marland Kitchen ammonium lactate (LAC-HYDRIN) 12 % lotion Apply 1 application topically 2 (two) times daily.     . cholecalciferol (VITAMIN D3) 25 MCG (1000 UT) tablet Take 1,000 Units by mouth daily.    Marland Kitchen dexlansoprazole  (DEXILANT) 60 MG capsule Take 60 mg by mouth daily. Sample med    . famotidine (PEPCID) 40 MG tablet Take 40 mg by mouth at bedtime.     . fluticasone (CUTIVATE) 0.05 % cream Apply topically 2 (two) times daily. to rectum. For 10 days (Patient not taking: Reported on 10/31/2019) 30 g 2  . hydrocortisone (ANUSOL-HC) 25 MG suppository Place 1 suppository (25 mg total) rectally 2 (two) times daily as needed for hemorrhoids or anal itching. For first week may use 1-2 times daily (with evening suppository before bedtime).  After 1-week then decrease to only every night before bedtime. 12 suppository 1  . methimazole (TAPAZOLE) 5 MG tablet Take 1 tablet (5 mg total) by mouth 2 (two) times daily. 60 tablet 0  . montelukast (SINGULAIR) 10 MG tablet Take 1 tablet (10 mg total) by mouth at bedtime. 30 tablet  5  . Triamcinolone Acetonide (NASACORT AQ NA) Place 1 spray into the nose 2 (two) times daily.     No current facility-administered medications for this visit.    Allergies  Allergen Reactions  . Iodinated Diagnostic Agents     Other reaction(s): NO ALLERGY Patient said that he isn't allergic to this   . Latex     Other reaction(s): NO ALLERGY    Review of Systems:  Has fatigue Recovering from recent COVID-19.     Physical Exam:    Ht 5\' 11"  (1.803 m)   Wt 242 lb (109.8 kg)   BMI 33.75 kg/m  Filed Weights   11/18/19 0759  Weight: 242 lb (109.8 kg)   Not examined since it was a televisit.  Data Reviewed: I have personally reviewed following labs and imaging studies  CBC: CBC Latest Ref Rng & Units 11/05/2019 11/04/2019 11/03/2019  WBC 4.0 - 10.5 K/uL 10.9(H) 10.5 8.1  Hemoglobin 13.0 - 17.0 g/dL 15.8 16.6 14.7  Hematocrit 39.0 - 52.0 % 48.1 50.5 44.2  Platelets 150 - 400 K/uL 354 372 310    CMP: CMP Latest Ref Rng & Units 11/05/2019 11/04/2019 11/03/2019  Glucose 70 - 99 mg/dL 107(H) 75 68(L)  BUN 6 - 20 mg/dL 17 14 15   Creatinine 0.61 - 1.24 mg/dL 1.00 1.06 0.91  Sodium  135 - 145 mmol/L 136 137 137  Potassium 3.5 - 5.1 mmol/L 5.0 3.7 4.0  Chloride 98 - 111 mmol/L 101 100 101  CO2 22 - 32 mmol/L 25 23 26   Calcium 8.9 - 10.3 mg/dL 9.2 9.2 8.7(L)  Total Protein 6.5 - 8.1 g/dL 6.3(L) 6.7 5.7(L)  Total Bilirubin 0.3 - 1.2 mg/dL 0.7 0.8 0.8  Alkaline Phos 38 - 126 U/L 59 59 55  AST 15 - 41 U/L 22 27 27   ALT 0 - 44 U/L 29 29 30  25  minutes spent with the patient today. Greater than 50% was spent in counseling and coordination of care with the patient     Carmell Austria, MD 11/18/2019, 9:22 AM  Cc: Nicoletta Dress, MD

## 2019-11-19 ENCOUNTER — Telehealth: Payer: Self-pay | Admitting: Internal Medicine

## 2019-11-19 ENCOUNTER — Telehealth: Payer: Self-pay | Admitting: Gastroenterology

## 2019-11-19 DIAGNOSIS — K645 Perianal venous thrombosis: Secondary | ICD-10-CM | POA: Diagnosis not present

## 2019-11-19 NOTE — Telephone Encounter (Signed)
Please see notation from Dr. Hilarie Fredrickson for patient care plan

## 2019-11-19 NOTE — Telephone Encounter (Signed)
Pt called with extreme anal pain, worsening, unable to sit Trying cream, increased H2O, and sitz baths No relief In audible significant pain  [facility] per box instruction recommended (OTC) Needs STAT referral to [facility] for thrombosed external hemorrhoid, they should be able to see today or tomorrow Continue Sitz baths  Backup plan is ER, but try hard for outpatient care

## 2019-11-19 NOTE — Telephone Encounter (Signed)
Called and spoke with Dr. Venita Sheffield office- patient has been scheduled today at 11:00 am for surgical consultation;   Called and spoke with patient-patient informed of appt time w/Dr. Noberto Retort and is agreeable to plan of care;  patient verbalized appreciation of quickness on obtaining appt with sx; Patient advised to call back to the office at [phone] should questions/concerns arise;  Patient verbalized understanding of information/instructions;

## 2019-11-19 NOTE — Telephone Encounter (Signed)
Patient states he talked to on call doctor early this morning around 6:30-7:00- he is in a lot of pain and the doctor was supposed to try to call [facility] to see if he could get in today to get the hemorrhoid lanced. He is following up to see if the doctor has spoke with anyone.

## 2019-11-22 DIAGNOSIS — U071 COVID-19: Secondary | ICD-10-CM | POA: Diagnosis not present

## 2019-11-22 DIAGNOSIS — R05 Cough: Secondary | ICD-10-CM | POA: Diagnosis not present

## 2019-11-25 ENCOUNTER — Telehealth: Payer: Self-pay | Admitting: Gastroenterology

## 2019-11-25 NOTE — Telephone Encounter (Signed)
Called and spoke with patient-patient informed he can try Gatorade, Powerade, Crystal Light, Kool-Aid, any fluids that he wants (might want to stay away from soft drinks); patient also advised to make sure he drinks plenty of fluids as not to become constipated;  Patient verbalized appreciation of information;  Patient advised to call back to the office at [phone] should questions/concerns arise;  Patient verbalized understanding of information/instructions;   Any further instructions for this patient?

## 2019-11-26 DIAGNOSIS — Z09 Encounter for follow-up examination after completed treatment for conditions other than malignant neoplasm: Secondary | ICD-10-CM | POA: Insufficient documentation

## 2019-11-28 NOTE — Telephone Encounter (Signed)
No additional instructions for now RG

## 2019-12-03 DIAGNOSIS — U071 COVID-19: Secondary | ICD-10-CM | POA: Diagnosis not present

## 2019-12-03 DIAGNOSIS — R918 Other nonspecific abnormal finding of lung field: Secondary | ICD-10-CM | POA: Diagnosis not present

## 2019-12-03 DIAGNOSIS — E0590 Thyrotoxicosis, unspecified without thyrotoxic crisis or storm: Secondary | ICD-10-CM | POA: Diagnosis not present

## 2019-12-04 ENCOUNTER — Other Ambulatory Visit: Payer: Self-pay | Admitting: Allergy and Immunology

## 2019-12-04 MED ORDER — TRIAMCINOLONE ACETONIDE 55 MCG/ACT NA AERO: INHALATION_SPRAY | NASAL | 5 refills | Status: DC

## 2019-12-04 NOTE — Telephone Encounter (Signed)
[name] called in and stated he ran out of Nasacort samples and would like a prescription called in to [facility] on [address]

## 2019-12-04 NOTE — Telephone Encounter (Signed)
Prescription for Nasacort was sent in to requested pharmacy. Called and left message on machine for patient.

## 2019-12-16 ENCOUNTER — Telehealth: Payer: Self-pay | Admitting: Gastroenterology

## 2019-12-16 NOTE — Telephone Encounter (Signed)
For reflux and history of diverticulitis -Continue omeprazole 40 mg every morning and Pepcid 40 mg p.o. nightly for now (if he wants to get off Dexilant) -Surgery for recurrent diverticulitis: Only if he still is having problems -Continue high-fiber diet. -Exercise as much as he can  RG

## 2019-12-16 NOTE — Telephone Encounter (Signed)
Left message for patient to call back to the office for this information;

## 2019-12-16 NOTE — Telephone Encounter (Signed)
Called and spoke with patient-  (1)patient is wanting to know if he can go back to Omeprazole vs staying on Dexilant and Pepcid as recommended by his allergist (Dr. Neldon Mc)? States since being on Dexilant and Pepcid he has been waking up in the middle of the night -stay on Pepcid but increase Pepcid dosage and also stay on Dexilant or switch back to Omeprazole?  (2)Diverticulitis stools are better- increased water, increase fiber, not able to exercise but needing to know about the sx (to do or not to do)? Wanting to go out of town in May-- Hemorrhoids are gone (per sx) Wanting to know if the diverticulitis surgery that surgeon has recommended is what he needs to do and how soon does it have to be done?  Please advise

## 2019-12-16 NOTE — Telephone Encounter (Signed)
Patient returned call to the office- patient given this information and is agreeable to plan of care;  Patient advised to call back to the office at [phone] should questions/concerns arise;  Patient verbalized understanding of information/instructions;

## 2019-12-24 ENCOUNTER — Ambulatory Visit: Payer: Medicare Other | Admitting: Allergy and Immunology

## 2020-01-08 ENCOUNTER — Ambulatory Visit: Payer: Medicare Other | Admitting: Allergy and Immunology

## 2020-01-12 ENCOUNTER — Encounter: Payer: Self-pay | Admitting: Allergy and Immunology

## 2020-01-12 ENCOUNTER — Ambulatory Visit (INDEPENDENT_AMBULATORY_CARE_PROVIDER_SITE_OTHER): Payer: Medicare Other | Admitting: Allergy and Immunology

## 2020-01-12 VITALS — BP 124/70 | HR 76 | Temp 97.6°F | Resp 20

## 2020-01-12 DIAGNOSIS — K219 Gastro-esophageal reflux disease without esophagitis: Secondary | ICD-10-CM | POA: Diagnosis not present

## 2020-01-12 DIAGNOSIS — J3089 Other allergic rhinitis: Secondary | ICD-10-CM

## 2020-01-12 DIAGNOSIS — J4540 Moderate persistent asthma, uncomplicated: Secondary | ICD-10-CM

## 2020-01-12 NOTE — Patient Instructions (Addendum)
  1.  Continue to treat and prevent inflammation:   A.  OTC Nasacort-1 spray each nostril 1 time per day  B.  Symbicort 160- 2 inhalations 1-2  times per day     C.  Montelukast 10 mg - 1 tablet 1 time per day  2.  If needed:   A.  Nasal saline  B.  Albuterol HFA-2 inhalations every 4-6 hours  3.  Continue to treat reflux with the following:   A.  Omeprazole 40 mg - 1 tablet in AM   B. Famotidine 40 mg - 1 tablet in PM  4.  Add Flovent 110 - 2 inhalations 2 times per day to Symbicort during increased asthma activity  5.  Return to clinic in 12 weeks or earlier if problem

## 2020-01-12 NOTE — Progress Notes (Signed)
[facility]   Follow-up Note  Referring Provider: Nicoletta Dress, MD Primary Provider: Nicoletta Dress, MD Date of Office Visit: 01/12/2020  Subjective:   Brandon Gallagher (DOB: 1961-07-14) is a 59 y.o. male who returns to the [facility] on 01/12/2020 in re-evaluation of the following:  HPI: Brandon Gallagher returns to this clinic in evaluation of asthma and history of chronic pansinusitis and reflux.  His last visit to this clinic was 01 October 2019.  During December 2020 he contracted Covid and had significant pneumonitis requiring hospitalization on 30 October 2019 successfully treated with remdesivir and systemic steroids.  As a result of that infection he does feel as though he may have some limitation in ability to exercise.  He can walk 1 lap at the [facility] without too much difficulty but he does get pretty tired walking.  He is now using additional inhaled steroid with fluticasone added to his Symbicort.  His nose is doing relatively well although occasionally he does have some green nasal discharge.  This appears to respond to over-the-counter antihistamine without much problem.  He can smell without any difficulty and does not have issues with headache.  He does continue to use his Nasacort on a regular basis.  He believes that his reflux is under good control at this point in time while using omeprazole and famotidine.  He is no longer on swallowed steroids as the diagnosis of inflammatory bowel disease was somewhat cursory and he did not respond to Entocort.  Interestingly, he is doing a lot better with his abdomen since his hospitalization and it should be noted that he did receive relatively high-dose systemic steroids during his Covid infection.  Allergies as of 01/12/2020      Reactions   Iodinated Diagnostic Agents    Other reaction(s): NO ALLERGY Patient said that he isn't allergic to this    Latex    Other  reaction(s): NO ALLERGY      Medication List    albuterol 108 (90 Base) MCG/ACT inhaler Commonly known as: VENTOLIN HFA Can inhale two puffs every four to six hours as needed for cough or wheeze.   ALPRAZolam 1 MG tablet Commonly known as: XANAX Take 1 mg by mouth 3 (three) times daily as needed for anxiety.   ammonium lactate 12 % lotion Commonly known as: LAC-HYDRIN Apply 1 application topically 2 (two) times daily.   benzonatate 200 MG capsule Commonly known as: TESSALON   cholecalciferol 25 MCG (1000 UNIT) tablet Commonly known as: VITAMIN D3 Take 1,000 Units by mouth daily.   famotidine 40 MG tablet Commonly known as: PEPCID Take 40 mg by mouth at bedtime.   Flovent HFA 110 MCG/ACT inhaler Generic drug: fluticasone SMARTSIG:2 Puff(s) By Mouth Twice Daily   fluticasone 0.05 % cream Commonly known as: CUTIVATE Apply topically 2 (two) times daily. to rectum. For 10 days   hydrocortisone 25 MG suppository Commonly known as: ANUSOL-HC Place 1 suppository (25 mg total) rectally 2 (two) times daily as needed for hemorrhoids or anal itching. For first week may use 1-2 times daily (with evening suppository before bedtime).  After 1-week then decrease to only every night before bedtime.   methimazole 5 MG tablet Commonly known as: TAPAZOLE Take 1 tablet (5 mg total) by mouth 2 (two) times daily.   montelukast 10 MG tablet Commonly known as: SINGULAIR Take 1 tablet (10 mg total) by mouth at bedtime.   NASACORT  AQ NA Place 1 spray into the nose 2 (two) times daily.   triamcinolone 55 MCG/ACT Aero nasal inhaler Commonly known as: NASACORT Use 1 spray in each nostril once daily       Past Medical History:  Diagnosis Date  . Allergy   . Anxiety   . Arthritis   . Asthma   . Blood transfusion without reported diagnosis   . Bronchitis    last had back in 2017   Sees no pulmonary md  . COPD (chronic obstructive pulmonary disease) ([place])   . Depression   .  Diverticulosis    last flare up was in Jan. 2018  Was on course of antibiotics for this  . Esophagus disorder    narrowing of esophagus.  Had to have dilatation 09/2016  . Family history of adverse reaction to anesthesia    son had severe N/V with his knee surgery  . GERD (gastroesophageal reflux disease)   . Headache   . History of Clostridioides difficile colitis   . History of colon polyps   . IBS (irritable bowel syndrome)   . Sleep apnea    no CPAP- unsure if true diagnosis or not    Past Surgical History:  Procedure Laterality Date  . ANTERIOR CERVICAL DECOMP/DISCECTOMY FUSION N/A 01/01/2017   Procedure: Cervical four- five and Cervical six- seven Anterior cervical discectomy with fusion and plate fixation;  Surgeon: Kevan Ny Ditty, MD;  Location: [facility];  Service: Neurosurgery;  Laterality: N/A;  C4-5 and C6-7 Anterior cervical discectomy with fusion and plate fixation  . BACK SURGERY     x 2    @ [facility]        Last one 09/2015  . CARDIAC CATHETERIZATION     He thinks.   Not really sure where at, thought it was here @ [facility], but its been a LONG time ?2000  . COLONOSCOPY  10/22/2017   Colonic polyp status post polypectomy. Moderate predominantly sigmoid diverticulosis.   . COLONOSCOPY  2019   Dr Orlena Sheldon  . ESOPHAGOGASTRODUODENOSCOPY  06/08/2016   Distal esophageal stricture status post esophageal dilatation. Presbtesophagus, also suggestive of motility disorder. Mild gastritis.   Marland Kitchen NASAL SINUS SURGERY     HAS HAD 2 OR 3.   LAST ONE 2011, IN [place]  (LEFT NASAL PASSAGE IS NARROWER THAN RIGHT  . UPPER GASTROINTESTINAL ENDOSCOPY      Review of systems negative except as noted in HPI / PMHx or noted below:  Review of Systems  Constitutional: Negative.   HENT: Negative.   Eyes: Negative.   Respiratory: Negative.   Cardiovascular: Negative.   Gastrointestinal: Negative.   Genitourinary: Negative.   Musculoskeletal: Negative.   Skin: Negative.   Neurological:  Negative.   Endo/Heme/Allergies: Negative.   Psychiatric/Behavioral: Negative.      Objective:   Vitals:   01/12/20 1405 01/12/20 1411  BP: 124/70   Pulse: 76   Resp: 20   Temp: 97.6 F (36.4 C)   SpO2: 95% 90%          Physical Exam Constitutional:      Appearance: He is not diaphoretic.  HENT:     Head: Normocephalic.     Right Ear: Tympanic membrane, ear canal and external ear normal.     Left Ear: Tympanic membrane, ear canal and external ear normal.     Nose: Nose normal. No mucosal edema or rhinorrhea.     Mouth/Throat:     Pharynx: Uvula midline. No oropharyngeal  exudate.  Eyes:     Conjunctiva/sclera: Conjunctivae normal.  Neck:     Thyroid: No thyromegaly.     Trachea: Trachea normal. No tracheal tenderness or tracheal deviation.  Cardiovascular:     Rate and Rhythm: Normal rate and regular rhythm.     Heart sounds: Normal heart sounds, S1 normal and S2 normal. No murmur.  Pulmonary:     Effort: No respiratory distress.     Breath sounds: Normal breath sounds. No stridor. No wheezing or rales.  Lymphadenopathy:     Head:     Right side of head: No tonsillar adenopathy.     Left side of head: No tonsillar adenopathy.     Cervical: No cervical adenopathy.  Skin:    Findings: No erythema or rash.     Nails: There is no clubbing.  Neurological:     Mental Status: He is alert.     Diagnostics:    Spirometry was performed and demonstrated an FEV1 of 2.59 at 76 % of predicted.  Oxygen saturation on room air at rest was 95%.  Oxygen saturation on room air after walking the hallway for 2 minutes was 90%.  Assessment and Plan:   1. Asthma, moderate persistent, well-controlled   2. Perennial allergic rhinitis   3. Gastroesophageal reflux disease, unspecified whether esophagitis present     1.  Continue to treat and prevent inflammation:   A.  OTC Nasacort-1 spray each nostril 1 time per day  B.  Symbicort 160- 2 inhalations 1-2  times per day      C.  Montelukast 10 mg - 1 tablet 1 time per day  2.  If needed:   A.  Nasal saline  B.  Albuterol HFA-2 inhalations every 4-6 hours  3.  Continue to treat reflux with the following:   A.  Omeprazole 40 mg - 1 tablet in AM   B. Famotidine 40 mg - 1 tablet in PM  4.  Add Flovent 110 - 2 inhalations 2 times per day to Symbicort during increased asthma activity  5.  Return to clinic in 12 weeks or earlier if problem  Wallis appears to be doing better since his recent Covid pneumonitis but does have evidence of a significant deficit in his lung function as a result of that infection.  His oxygenation during exertion is somewhat marginal at this point.  I recommended that he continue to remain on Flovent for another month in addition to his other anti-inflammatory agents for his airway and I encouraged him to exercise with the understanding that severe shortness of breath should be avoided.  He can go through several intervals of exercise that is comfortable but he definitely should not get to the point where he is panting or breathlessness.  Hopefully within a month or so most of his lung healing will take place and he can discontinue his additional inhaled steroid and remain on his previously prescribed controller plan for his airway issue.  As well, he will continue to use therapy for reflux which appears to be going quite well at this point.  Allena Katz, MD Allergy / [facility]

## 2020-01-13 ENCOUNTER — Encounter: Payer: Self-pay | Admitting: Allergy and Immunology

## 2020-01-21 ENCOUNTER — Encounter: Payer: Self-pay | Admitting: *Deleted

## 2020-01-28 ENCOUNTER — Telehealth: Payer: Self-pay | Admitting: Gastroenterology

## 2020-01-28 NOTE — Telephone Encounter (Signed)
Called and spoke with patient- patient reports the pain has been gone and the bowel movements have been normal-up until today  -has been 2 days since COVID vaccine-having diarrhea today, and is wondering if the steroids that he was given in the hospital "helped my symptoms" and could help now that diarrhea has started back? -wondering if the COVID vaccine is what has caused the diarrhea? -keep taking or cut back on the fiber with the new onset of diarrhea?  RN advised increased po intake and bland diet (to decrease additional symptoms) Please advise

## 2020-01-28 NOTE — Telephone Encounter (Signed)
Pt reported that he had a covid vaccination and is experiencing diarrhea.  Please advise.

## 2020-01-29 NOTE — Telephone Encounter (Signed)
-  Steroids would have helped with diarrhea.  But too many side effects -Doubt if vaccine is a cause of diarrhea. -Can start taking fiber supplements  RG

## 2020-01-29 NOTE — Telephone Encounter (Signed)
Patient called and he is asking if it would be okay for him to start the fiber powder again because he has gained 12lbs.

## 2020-01-30 NOTE — Telephone Encounter (Signed)
Called and spoke with patient-patient informed of MD recommendations and patient is agreeable with plan of care;  Patient verbalized understanding of information/instructions;  Patient was advised to call the office at [phone] if questions/concerns arise;

## 2020-02-02 ENCOUNTER — Telehealth: Payer: Self-pay | Admitting: Gastroenterology

## 2020-02-02 DIAGNOSIS — R110 Nausea: Secondary | ICD-10-CM | POA: Diagnosis not present

## 2020-02-02 DIAGNOSIS — K5792 Diverticulitis of intestine, part unspecified, without perforation or abscess without bleeding: Secondary | ICD-10-CM | POA: Diagnosis not present

## 2020-02-02 NOTE — Telephone Encounter (Signed)
Please review and advise.

## 2020-02-02 NOTE — Telephone Encounter (Signed)
Please review previous message and advise 

## 2020-02-02 NOTE — Telephone Encounter (Signed)
Pt reported that he is experiencing severe pain and will contact PCP.  Pt would like to know whether a CT is advisable.

## 2020-02-03 ENCOUNTER — Encounter: Payer: Self-pay | Admitting: Gastroenterology

## 2020-02-03 DIAGNOSIS — K5792 Diverticulitis of intestine, part unspecified, without perforation or abscess without bleeding: Secondary | ICD-10-CM | POA: Diagnosis not present

## 2020-02-03 DIAGNOSIS — K5732 Diverticulitis of large intestine without perforation or abscess without bleeding: Secondary | ICD-10-CM | POA: Diagnosis not present

## 2020-02-03 NOTE — Telephone Encounter (Signed)
Had several CTs recently Should be evaluated I am glad that he is going to Dr. Delena Bali for clinic visit  RG

## 2020-02-03 NOTE — Telephone Encounter (Signed)
Called and spoke with patient-patient informed of MD recommendations; patient is agreeable with plan of care;  Patient verbalized understanding of information/instructions;  Patient was advised to call the office at [phone] if questions/concerns arise; 

## 2020-02-04 ENCOUNTER — Telehealth: Payer: Self-pay | Admitting: Gastroenterology

## 2020-02-04 NOTE — Telephone Encounter (Signed)
Called and spoke with patient- patient is wanting "Dr. Steve Rattler approval of the medication that Dr. Carolanne Grumbling placed me on for the the mild diverticulitis flare"?  -patient reports "the records were supposedly already sent to Dr. Lyndel Safe for reviewClarisa Fling ensure medical records have been received and placed on Dr. Steve Rattler desk for review  Dr. Lyndel Safe- please review records and advise as patient is "waiting to take this medication until you say it is okay to take"

## 2020-02-04 NOTE — Telephone Encounter (Signed)
Records reviewed CT Abdo/pelvis with IV contrast 02/03/2020: Mild sigmoid diverticulitis.  No abscess or fluid collection. Needs to finish Levaquin/Flagyl as prescribed by Dr. Delena Bali Report has been sent to Dr. Foye Spurling (surgery)  He is having recurrent attacks of diverticulitis. May benefit from surgery He should have follow-up appointment with Dr. Lovie Macadamia in [place].   Brandon Gallagher

## 2020-02-04 NOTE — Telephone Encounter (Signed)
Called and spoke with patient-patient informed of result note and MD recommendations; patient is agreeable with plan of care and reports he has a f/u appt with Dr. Lovie Macadamia on 02/05/2020; Patient verbalized understanding of information/instructions;  Patient was advised to call the office at [phone] if questions/concerns arise;

## 2020-02-05 DIAGNOSIS — K5732 Diverticulitis of large intestine without perforation or abscess without bleeding: Secondary | ICD-10-CM | POA: Insufficient documentation

## 2020-02-05 HISTORY — DX: Diverticulitis of large intestine without perforation or abscess without bleeding: K57.32

## 2020-02-11 ENCOUNTER — Other Ambulatory Visit: Payer: Self-pay | Admitting: Sports Medicine

## 2020-02-11 ENCOUNTER — Ambulatory Visit: Payer: Medicare Other | Admitting: Sports Medicine

## 2020-02-11 DIAGNOSIS — M79671 Pain in right foot: Secondary | ICD-10-CM

## 2020-02-11 DIAGNOSIS — M25571 Pain in right ankle and joints of right foot: Secondary | ICD-10-CM

## 2020-02-18 ENCOUNTER — Ambulatory Visit: Payer: Medicare Other | Admitting: Family Medicine

## 2020-02-18 ENCOUNTER — Encounter: Payer: Self-pay | Admitting: Family Medicine

## 2020-02-18 ENCOUNTER — Other Ambulatory Visit: Payer: Self-pay

## 2020-02-18 ENCOUNTER — Ambulatory Visit: Payer: Self-pay

## 2020-02-18 VITALS — BP 114/78 | Ht 71.0 in | Wt 251.0 lb

## 2020-02-18 DIAGNOSIS — M25571 Pain in right ankle and joints of right foot: Secondary | ICD-10-CM

## 2020-02-18 NOTE — Progress Notes (Signed)
   Subjective:    Patient ID: Brandon Gallagher, male    DOB: [birth year], 60 y.o.   MRN: [id number]  HPI 59 year old male who presents for right-sided medial/lateral ankle pain, along with pain of his posterior ankle.  Patient has had pain is areas before and was last seen back in August 2020 for this issue.  He states that approximately 1 month ago he fell into the shallow end of a swimming pool without water.  He had a lot of ankle and leg swelling around that time.  He states that the pain initially has gradually improved over the last month, but he has noticed the improvement stalling out a little bit.  Patient states he has been walking with a little bit of a limp since this time but has had no gait difficulties and no strength problems in his right leg.  He has been taking Biofreeze which has been helping a lot, along with Tylenol but states this does not help him.  He has to be really careful with NSAIDs given his previous GI issues.   Review of Systems Per HPI    Objective:   Physical Exam General: Very pleasant 59 year old male, no acute distress Respiratory: No accessory muscle use, no difficulty breathing Cardiac: Skin warm and dry  MSK Right foot/ankle: Minimal soft tissue swelling on right compared to left.  Tenderness to palpation inferior to lateral and medial malleolus.  Tenderness to palpation distal Achilles tendon, approximately 2 to 3 cm proximal to insertion on calcaneus.  Pain limited range of motion to foot inversion, eversion.  No limitation range of motion to plantarflexion, dorsiflexion.  5/5 strength foot flexion/extension.  Skin warm and dry, no focal neurologic deficit appreciated.  No instability noted to talar tilt or anterior/posterior drawer  Limited ultrasound interpretation: Soft tissue swelling noted about the lateral and medial ankle.  No evidence of tearing of peroneal, posterior tibialis tendons.  No evidence of tear in the Achilles tendon.  Minimal edema noted  surrounding peroneal tendons and posterior tibialis tendon. Cortical irregularity suggesting possible old avulsion fracture deep to FDP tendon at ankle.  Disorganized appearing ATFL    Assessment & Plan:   Strain of peroneal tendon/posterior tibialis tendon No findings on ultrasound consistent with tear of either of these 2 tendons.  Replaced patient's shoe inserts to help with this issue with sports insoles + scaphoid pads.  Discussed that patient can use the walking boot that he already has if he needs it for comfort but hopefully will not need this.  Recommended icing for 15 minutes at a time 3-4 times a day.  Can take Tylenol and topical Biofreeze as needed.  Gave home directed exercises.  Will consider formal physical therapy if patient continues to have issues.  Follow-up in 1 month  Achilles tendon strain Without any evidence of tearing or even swelling on ultrasound.  Gave stretches to do at home, can consider formal physical therapy if still having issues with this.  Did replace patient is worn out inserts.  Ice, Tylenol, Biofreeze as above.  Follow-up in 1 month  Guadalupe Dawn MD PGY-3 Family Medicine Resident

## 2020-02-18 NOTE — Patient Instructions (Signed)
Your ultrasound looks good. You strained a lot of things! But nothing looks torn - posterior tibialis tendon, achilles, peroneal tendons. If you can use the shoes with the inserts that would be ideal. If pain is too severe you can wear the walking boot though. Icing 15 minutes at a time 3-4 times a day. Tylenol, topical biofreeze as needed. Do home exercises as directed once a day. Consider formal physical therapy if you're struggling. Follow up with me in about 1 month.

## 2020-02-27 DIAGNOSIS — R918 Other nonspecific abnormal finding of lung field: Secondary | ICD-10-CM | POA: Diagnosis not present

## 2020-03-01 ENCOUNTER — Telehealth: Payer: Self-pay | Admitting: Gastroenterology

## 2020-03-01 NOTE — Telephone Encounter (Signed)
I have called and left patient a message to return my call.  

## 2020-03-03 DIAGNOSIS — R7301 Impaired fasting glucose: Secondary | ICD-10-CM | POA: Diagnosis not present

## 2020-03-03 DIAGNOSIS — E785 Hyperlipidemia, unspecified: Secondary | ICD-10-CM | POA: Diagnosis not present

## 2020-03-03 NOTE — Telephone Encounter (Signed)
Patient says that he has been battling diverticulitis for about 6 weeks. Patient said he has been on three rounds of Flagyl 500mg  TID x 10 days and Levaquin 750 mg once daily x 10 days, prescribed by Dr. Delena Bali. Patient says that hes going to have surgery by Dr. Coralie Keens after he gets back from vacation in June. Patient just wanted to make sure that you were aware and to see if you had any other recommendations.

## 2020-03-05 ENCOUNTER — Ambulatory Visit: Payer: Medicare Other | Admitting: Sports Medicine

## 2020-03-08 NOTE — Telephone Encounter (Signed)
Patient calling back regarding message he left last week.  Patient is upset that he has not heard back and would like a return call.

## 2020-03-08 NOTE — Telephone Encounter (Signed)
I have spoke to patient and made him an appointment to follow up with [facility] on 4/28.

## 2020-03-10 ENCOUNTER — Ambulatory Visit: Payer: Medicare Other | Admitting: Nurse Practitioner

## 2020-03-10 ENCOUNTER — Telehealth: Payer: Self-pay | Admitting: Gastroenterology

## 2020-03-10 ENCOUNTER — Encounter: Payer: Self-pay | Admitting: *Deleted

## 2020-03-10 NOTE — Telephone Encounter (Signed)
I have called and spoke to patient and rescheduled his appointment for today to see the PA tomorrow.

## 2020-03-11 ENCOUNTER — Ambulatory Visit: Payer: Medicare Other | Admitting: Physician Assistant

## 2020-03-11 ENCOUNTER — Encounter: Payer: Self-pay | Admitting: Physician Assistant

## 2020-03-11 VITALS — BP 136/74 | HR 84 | Temp 97.7°F | Ht 71.0 in | Wt 250.0 lb

## 2020-03-11 DIAGNOSIS — K5732 Diverticulitis of large intestine without perforation or abscess without bleeding: Secondary | ICD-10-CM

## 2020-03-11 NOTE — Patient Instructions (Signed)
Please purchase the following medications over the counter and take as directed: Align- 1 capsule once daily.  Please follow up with our office as needed.  If you are age 60 or older, your body mass index should be between 23-30. Your Body mass index is 34.87 kg/m. If this is out of the aforementioned range listed, please consider follow up with your Primary Care Provider.  If you are age 21 or younger, your body mass index should be between 19-25. Your Body mass index is 34.87 kg/m. If this is out of the aformentioned range listed, please consider follow up with your Primary Care Provider.

## 2020-03-11 NOTE — Progress Notes (Signed)
Chief Complaint: Diverticulitis  HPI:    Mr. Brandon Gallagher is a 59 year old male with a past medical history as listed below including COPD and GERD, known to Dr. Lyndel Safe, who presents clinic today to discuss his diverticulitis.      08/07/2019 patient was seen in clinic for diarrhea and abdominal pain.  At that time he had had a negative colonoscopy 02/2018 and negative CTAP 05/2019.  He was diagnosed with IBS with predominant constipation and occasional diarrhea.  Random colonic biopsies did not demonstrate lymphocytic colitis.  Also discussed reflux with dysphagia.  He was referred to [facility] for a another opinion in regards to his GI symptoms.    09/15/2019 patient had a telemedicine visit with Dr. Lyndel Safe.  At that time they discussed recurrent sigmoid diverticulitis (CTAP with contrast 10/20), treated with Levaquin plus Flagyl (patient was currently on a second course), no abscess, negative: 02/2018, flex sig 06/2019 with moderate sigmoid diverticulosis, colonic polyp status post polypectomy (TA's) and internal hemorrhoids.  At that time was recommended that he finish his current course of Levaquin and Flagyl and start probiotics.  As discussed if there are any further problems then would consider repeat CT/MRI of the pelvis.    02/05/2020 patient seen at [facility] surgical specialist.  There was a recent CT scan of the abdomen pelvis 02/03/2020 which demonstrated acute sigmoid diverticulitis with no perforation or abscess.  That time he is feeling some better.  He was instructed to finish his current antibiotics and call immediately if he developed any recurrent abdominal pain or any other changes.  Patient elected to follow-up in June to discuss elective laparoscopic sigmoid colon resection.    03/01/2020 patient called and said he been battling diverticulitis for 6 weeks.  He had been on 3 rounds of Flagyl 500 3 times daily x10 days and Levaquin 750 mg once daily x10 days, prescribed by Dr. Elissa Hefty.   He described that he is going to have surgery by Dr. Lovie Macadamia after he gets back from vacation in June.    Today, the patient presents to clinic accompanied by his wife and tells me that he is doing okay but does continue with a 2/10 left lower quadrant abdominal pain which is worse when he is moving around.  He is having mostly soft stools about 2-3 times a day and tries to stay on a soft diet for the most part.  Tells me that he was feeling a lot better after his last round of antibiotics but then had the Moderna vaccine about 2 weeks ago and his pain increased to an 11/10 and it also increased his back pain for about 4 days but then it went back down to a 2/10.  Tells me that he has been on 6 weeks of antibiotics total and is about to be finished tomorrow or the next day with his current round.  Tells me he is following with surgery at [facility] and has an appoint with them in June for his preop for sigmoidectomy.    Patient and his wife are going on a beach vacation at the end of May to the beginning of June.    Denies fever, chills, weight loss, blood in his stool or symptoms that awaken him from sleep.  Past Medical History:  Diagnosis Date  . Allergy   . Anxiety   . Arthritis   . Asthma   . Blood transfusion without reported diagnosis   . Bronchitis    last had  back in 2017   Sees no pulmonary md  . COPD (chronic obstructive pulmonary disease) ([place])   . Depression   . Diverticulitis   . Diverticulosis    last flare up was in Jan. 2018  Was on course of antibiotics for this  . Esophageal stricture   . Esophagus disorder    narrowing of esophagus.  Had to have dilatation 09/2016  . Family history of adverse reaction to anesthesia    son had severe N/V with his knee surgery  . Gastric polyp   . GERD (gastroesophageal reflux disease)   . Headache   . History of Clostridioides difficile colitis   . History of colon polyps   . IBS (irritable bowel syndrome)   . Presbyesophagus   .  Sleep apnea    no CPAP- unsure if true diagnosis or not  . Thrombosed external hemorrhoid     Past Surgical History:  Procedure Laterality Date  . ANTERIOR CERVICAL DECOMP/DISCECTOMY FUSION N/A 01/01/2017   Procedure: Cervical four- five and Cervical six- seven Anterior cervical discectomy with fusion and plate fixation;  Surgeon: Kevan Ny Ditty, MD;  Location: [facility];  Service: Neurosurgery;  Laterality: N/A;  C4-5 and C6-7 Anterior cervical discectomy with fusion and plate fixation  . BACK SURGERY     x 2    @ [facility]        Last one 09/2015  . CARDIAC CATHETERIZATION     He thinks.   Not really sure where at, thought it was here @ [facility], but its been a LONG time ?2000  . COLONOSCOPY  10/22/2017   Colonic polyp status post polypectomy. Moderate predominantly sigmoid diverticulosis.   . COLONOSCOPY  2019   Dr Orlena Sheldon  . ESOPHAGOGASTRODUODENOSCOPY  06/08/2016   Distal esophageal stricture status post esophageal dilatation. Presbtesophagus, also suggestive of motility disorder. Mild gastritis.   Marland Kitchen evacuation thrombosed external hemorrhoid  2021  . NASAL SINUS SURGERY     HAS HAD 2 OR 3.   LAST ONE 2011, IN [place]  (LEFT NASAL PASSAGE IS NARROWER THAN RIGHT  . UPPER GASTROINTESTINAL ENDOSCOPY      Current Outpatient Medications  Medication Sig Dispense Refill  . albuterol (VENTOLIN HFA) 108 (90 Base) MCG/ACT inhaler Can inhale two puffs every four to six hours as needed for cough or wheeze. 18 g 1  . ALPRAZolam (XANAX) 1 MG tablet Take 1 mg by mouth 3 (three) times daily as needed for anxiety.     Marland Kitchen ammonium lactate (LAC-HYDRIN) 12 % lotion Apply 1 application topically 2 (two) times daily.     . benzonatate (TESSALON) 200 MG capsule     . cholecalciferol (VITAMIN D3) 25 MCG (1000 UT) tablet Take 1,000 Units by mouth daily.    . famotidine (PEPCID) 40 MG tablet Take 40 mg by mouth at bedtime.     Marland Kitchen FLOVENT HFA 110 MCG/ACT inhaler SMARTSIG:2 Puff(s) By Mouth Twice Daily    .  fluticasone (CUTIVATE) 0.05 % cream Apply topically 2 (two) times daily. to rectum. For 10 days 30 g 2  . hydrocortisone (ANUSOL-HC) 25 MG suppository Place 1 suppository (25 mg total) rectally 2 (two) times daily as needed for hemorrhoids or anal itching. For first week may use 1-2 times daily (with evening suppository before bedtime).  After 1-week then decrease to only every night before bedtime. 12 suppository 1  . methimazole (TAPAZOLE) 5 MG tablet Take 1 tablet (5 mg total) by mouth 2 (two) times daily. [place]  tablet 0  . montelukast (SINGULAIR) 10 MG tablet Take 1 tablet (10 mg total) by mouth at bedtime. 30 tablet 5  . triamcinolone (NASACORT) 55 MCG/ACT AERO nasal inhaler Use 1 spray in each nostril once daily 1 Inhaler 5   No current facility-administered medications for this visit.    Allergies as of 03/11/2020 - Review Complete 02/18/2020  Allergen Reaction Noted  . Iodinated diagnostic agents  09/11/2018  . Latex  09/11/2018    Family History  Problem Relation Age of Onset  . Heart disease Mother   . Stroke Father   . Colon cancer Neg Hx   . Esophageal cancer Neg Hx   . Stomach cancer Neg Hx   . Diabetes Neg Hx   . Rectal cancer Neg Hx     Social History   Socioeconomic History  . Marital status: Married    Spouse name: Not on file  . Number of children: 3  . Years of education: Not on file  . Highest education level: Not on file  Occupational History  . Occupation: Disablilty  Tobacco Use  . Smoking status: Former Smoker    Packs/day: 1.00    Years: 30.00    Pack years: 30.00    Types: Cigarettes    Quit date: 05/12/2000    Years since quitting: 19.8  . Smokeless tobacco: Never Used  Substance and Sexual Activity  . Alcohol use: No    Alcohol/week: 0.0 standard drinks  . Drug use: Not Currently  . Sexual activity: Not on file  Other Topics Concern  . Not on file  Social History Narrative  . Not on file   Social Determinants of Health   Financial  Resource Strain:   . Difficulty of Paying Living Expenses:   Food Insecurity:   . Worried About Charity fundraiser in the Last Year:   . Arboriculturist in the Last Year:   Transportation Needs:   . Film/video editor (Medical):   Marland Kitchen Lack of Transportation (Non-Medical):   Physical Activity:   . Days of Exercise per Week:   . Minutes of Exercise per Session:   Stress:   . Feeling of Stress :   Social Connections:   . Frequency of Communication with Friends and Family:   . Frequency of Social Gatherings with Friends and Family:   . Attends Religious Services:   . Active Member of Clubs or Organizations:   . Attends Archivist Meetings:   Marland Kitchen Marital Status:   Intimate Partner Violence:   . Fear of Current or Ex-Partner:   . Emotionally Abused:   Marland Kitchen Physically Abused:   . Sexually Abused:     Review of Systems:    Constitutional: No weight loss, fever or chills Cardiovascular: No chest pain   Respiratory: No SOB  Gastrointestinal: See HPI and otherwise negative   Physical Exam:  Vital signs: BP 136/74   Pulse 84   Temp 97.7 F (36.5 C)   Ht 5\' 11"  (1.803 m)   Wt 250 lb (113.4 kg)   BMI 34.87 kg/m   Constitutional:   Pleasant Caucasian male appears to be in NAD, Well developed, Well nourished, alert and cooperative Respiratory: Respirations even and unlabored. Lungs clear to auscultation bilaterally.   No wheezes, crackles, or rhonchi.  Cardiovascular: Normal S1, S2. No MRG. Regular rate and rhythm. No peripheral edema, cyanosis or pallor.  Gastrointestinal:  Soft, nondistended,moderate LLQ ttp No rebound or guarding. Normal bowel sounds. No  appreciable masses or hepatomegaly. Rectal:  Not performed.  Psychiatric:  Demonstrates good judgement and reason without abnormal affect or behaviors.  RELEVANT LABS AND IMAGING: CBC    Component Value Date/Time   WBC 10.9 (H) 11/05/2019 0005   RBC 5.27 11/05/2019 0005   HGB 15.8 11/05/2019 0005   HCT 48.1  11/05/2019 0005   PLT 354 11/05/2019 0005   MCV 91.3 11/05/2019 0005   MCH 30.0 11/05/2019 0005   MCHC 32.8 11/05/2019 0005   RDW 12.6 11/05/2019 0005   LYMPHSABS 0.8 11/05/2019 0005   MONOABS 0.7 11/05/2019 0005   EOSABS 0.0 11/05/2019 0005   BASOSABS 0.1 11/05/2019 0005    CMP     Component Value Date/Time   NA 136 11/05/2019 0005   K 5.0 11/05/2019 0005   CL 101 11/05/2019 0005   CO2 25 11/05/2019 0005   GLUCOSE 107 (H) 11/05/2019 0005   BUN 17 11/05/2019 0005   CREATININE 1.00 11/05/2019 0005   CALCIUM 9.2 11/05/2019 0005   PROT 6.3 (L) 11/05/2019 0005   ALBUMIN 2.9 (L) 11/05/2019 0005   AST 22 11/05/2019 0005   ALT 29 11/05/2019 0005   ALKPHOS 59 11/05/2019 0005   BILITOT 0.7 11/05/2019 0005   GFRNONAA >60 11/05/2019 0005   GFRAA >60 11/05/2019 0005    Assessment: 1.  Recurrent diverticulitis: Patient has now been on 6 weeks of antibiotics but continues with a 2/10 left lower quadrant abdominal pain and is toward the last couple days of his last round, last CT in mid March with signs of diverticulitis, has seen surgery at [facility] for sigmoidectomy which is planned in June  Plan: 1.  Discussed with patient that he has what we call smoldering diverticulitis.  I fully agree with plans for surgery.  At this point though we will try not to prescribe him any more antibiotics unless symptoms increase/worsen or he develops fever or other.  Could discuss Augmentin at that time versus admission with IV antibiotics.  At that point he should call our office and let us know. 2.  Recommend the patient stay on a low fiber/ low residue diet. 3.  Recommend the patient start Align probiotic daily 4.  Patient to call and let us know if he has any problems before time of surgery.  Ellouise Newer, PA-C [facility] 03/11/2020, 1:19 PM  Cc: Nicoletta Dress, MD

## 2020-03-17 ENCOUNTER — Other Ambulatory Visit: Payer: Self-pay | Admitting: Sports Medicine

## 2020-03-17 ENCOUNTER — Ambulatory Visit: Payer: Medicare Other | Admitting: Sports Medicine

## 2020-03-17 DIAGNOSIS — M25571 Pain in right ankle and joints of right foot: Secondary | ICD-10-CM

## 2020-03-17 DIAGNOSIS — M79671 Pain in right foot: Secondary | ICD-10-CM

## 2020-03-29 ENCOUNTER — Telehealth: Payer: Self-pay | Admitting: Physician Assistant

## 2020-03-29 NOTE — Telephone Encounter (Signed)
The pt was advised that he can continue yogurt and align.  He was also advised that if he does not continue to improve he should call our office.  He was diagnosed with diverticulitis 4/29.  The pt has been advised of the information and verbalized understanding.

## 2020-03-31 ENCOUNTER — Ambulatory Visit: Payer: Medicare Other | Admitting: Sports Medicine

## 2020-03-31 ENCOUNTER — Ambulatory Visit (INDEPENDENT_AMBULATORY_CARE_PROVIDER_SITE_OTHER): Payer: Medicare Other

## 2020-03-31 ENCOUNTER — Other Ambulatory Visit: Payer: Self-pay

## 2020-03-31 ENCOUNTER — Other Ambulatory Visit: Payer: Self-pay | Admitting: Sports Medicine

## 2020-03-31 DIAGNOSIS — M779 Enthesopathy, unspecified: Secondary | ICD-10-CM | POA: Diagnosis not present

## 2020-03-31 DIAGNOSIS — M79671 Pain in right foot: Secondary | ICD-10-CM

## 2020-03-31 DIAGNOSIS — R2681 Unsteadiness on feet: Secondary | ICD-10-CM

## 2020-03-31 DIAGNOSIS — M25571 Pain in right ankle and joints of right foot: Secondary | ICD-10-CM

## 2020-03-31 DIAGNOSIS — G629 Polyneuropathy, unspecified: Secondary | ICD-10-CM

## 2020-03-31 DIAGNOSIS — M7751 Other enthesopathy of right foot: Secondary | ICD-10-CM | POA: Diagnosis not present

## 2020-03-31 MED ORDER — TRIAMCINOLONE ACETONIDE 10 MG/ML IJ SUSP
10.0000 mg | Freq: Once | INTRAMUSCULAR | Status: AC
  Administered 2020-03-31: 10 mg

## 2020-03-31 NOTE — Progress Notes (Addendum)
Subjective:  Brandon Gallagher is a 59 y.o. male patient who presents to office today for right lateral and posterior heel pain for the last 3 to 4 months reports that he twisted the ankle and has had joint pain 3 of 10 and also sharp in nature worse with certain movements has tried Tylenol and patient denies increased swelling, warmth, redness or any other limitations with motion.  No other pedal complaints noted.   Patient Active Problem List   Diagnosis Date Noted  . Acute respiratory disease due to COVID-19 virus 10/30/2019  . Acute respiratory failure with hypoxia ([place]) 10/30/2019  . 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 Acute hypoxemic respiratory failure due to COVID-19 ([place]) 10/30/2019  . Sinus bradycardia by electrocardiogram 10/30/2019  . Pain in joint, ankle and foot 11/22/2018  . Cervical spondylosis with radiculopathy 01/01/2017  . Sacroiliac joint pain 07/27/2016  . Chronic bilateral low back pain without sciatica 01/10/2016  . Fall 01/10/2016  . S/P lumbar laminectomy 11/01/2015  . Asthma 10/06/2015  . GERD (gastroesophageal reflux disease) 10/06/2015  . OSA (obstructive sleep apnea) 10/06/2015  . Morbid obesity due to excess calories ([place]) 07/28/2015    Current Outpatient Medications on File Prior to Visit  Medication Sig Dispense Refill  . albuterol (VENTOLIN HFA) 108 (90 Base) MCG/ACT inhaler Can inhale two puffs every four to six hours as needed for cough or wheeze. 18 g 1  . ALPRAZolam (XANAX) 1 MG tablet Take 1 mg by mouth 3 (three) times daily as needed for anxiety.     . benzonatate (TESSALON) 200 MG capsule     . cholecalciferol (VITAMIN D3) 25 MCG (1000 UT) tablet Take 1,000 Units by mouth daily.    . famotidine (PEPCID) 40 MG tablet Take 40 mg by mouth at bedtime.     Marland Kitchen FLOVENT HFA 110 MCG/ACT inhaler SMARTSIG:2 Puff(s) By Mouth Twice Daily    . montelukast (SINGULAIR) 10 MG tablet Take 1 tablet (10 mg total) by mouth at bedtime. 30 tablet 5  . triamcinolone  (NASACORT) 55 MCG/ACT AERO nasal inhaler Use 1 spray in each nostril once daily 1 Inhaler 5   No current facility-administered medications on file prior to visit.    Allergies  Allergen Reactions  . Iodinated Diagnostic Agents     Other reaction(s): NO ALLERGY Patient said that he isn't allergic to this   . Latex     Other reaction(s): NO ALLERGY    Objective:  General: Alert and oriented x3 in no acute distress  Dermatology: No open lesions bilateral lower extremities, no webspace macerations, no ecchymosis bilateral, all nails x 10 are well manicured.  Mild reactive callus sub-met one and 5 bilateral.  Vascular: Dorsalis Pedis and Posterior Tibial pedal pulses palpable, Capillary Fill Time 3 seconds,(+) pedal hair growth bilateral, no edema bilateral lower extremities, Temperature gradient within normal limits.  Neurology: Gross sensation intact via light touch bilateral, vibratory sensation diminished bilateral consistent with history of neuropathy.  Musculoskeletal: There is mild tenderness along the peroneal tendon course and lateral Achilles on the right.  Range of motion limited with mild guarding on right ankle.  Severe hammertoe deformity bilateral, strength within normal limits in all groups bilateral except on right ankle where there is mild weakness and guarding due to past history of injury.   X-rays no acute osseous findings there is diffuse arthritis and severe digital contractures supportive of hammertoe deformity   Assessment and Plan: Problem List Items Addressed This Visit  None    Visit Diagnoses    Tendonitis    -  Primary   Right ankle pain, unspecified chronicity       Neuropathy       Right foot pain       Gait instability          -Complete examination performed -Discussed continued care for tendinitis in the setting of neuropathy and ankle pain with history of previous injury -After oral consent and aseptic prep, injected a mixture containing 1  ml of 2%  plain lidocaine, 1 ml 0.5% plain marcaine, 0.5 ml of kenalog 10 and 0.5 ml of dexamethasone phosphate into right lateral ankle without complication. Post-injection care discussed with patient.  -Advised patient to return to using cam boot or ankle brace when he is out of town if his ankle continues to hurt or bother him -Recommend continue with rest ice elevation -Prescribed physical therapy for gait strengthening and pain reduction at [facility] -Patient to return to office after PT or sooner if condition worsens.  Landis Martins, DPM

## 2020-04-05 ENCOUNTER — Ambulatory Visit: Payer: Medicare Other | Admitting: Allergy and Immunology

## 2020-04-19 ENCOUNTER — Ambulatory Visit (INDEPENDENT_AMBULATORY_CARE_PROVIDER_SITE_OTHER): Payer: Medicare Other | Admitting: Allergy and Immunology

## 2020-04-19 ENCOUNTER — Encounter: Payer: Self-pay | Admitting: Allergy and Immunology

## 2020-04-19 ENCOUNTER — Other Ambulatory Visit: Payer: Self-pay

## 2020-04-19 VITALS — BP 94/60 | HR 96 | Resp 16

## 2020-04-19 DIAGNOSIS — J4540 Moderate persistent asthma, uncomplicated: Secondary | ICD-10-CM | POA: Diagnosis not present

## 2020-04-19 DIAGNOSIS — D802 Selective deficiency of immunoglobulin A [IgA]: Secondary | ICD-10-CM

## 2020-04-19 DIAGNOSIS — K219 Gastro-esophageal reflux disease without esophagitis: Secondary | ICD-10-CM | POA: Diagnosis not present

## 2020-04-19 DIAGNOSIS — J3089 Other allergic rhinitis: Secondary | ICD-10-CM | POA: Diagnosis not present

## 2020-04-19 NOTE — Progress Notes (Signed)
[place]   Follow-up Note  Referring Provider: Nicoletta Dress, MD Primary Provider: Nicoletta Dress, MD Date of Office Visit: 04/19/2020  Subjective:   Brandon Gallagher (DOB: 10-11-1961) is a 59 y.o. male who returns to the [facility] and [place] on 04/19/2020 in re-evaluation of the following:  HPI: Brandon Gallagher returns to this clinic in evaluation of asthma and a history of chronic pansinusitis and reflux and IgA deficiency.  His last visit to this clinic was 12 January 2020.  His airway is doing well since his Covid infection with pulmonary comorbidity.  He feels as though he has more air and more ability to exercise.  He is not using a short acting bronchodilator.  Is has been able to stop his Flovent yet continues to use his combination inhaler consistently.  He had very little problems with his nose at this point in time.  His reflux is under good control.  He has had another episode of diverticulitis and is scheduled for a colectomy with Dr. Lovie Macadamia at some point in the near future.  Allergies as of 04/19/2020      Reactions   Iodinated Diagnostic Agents    Other reaction(s): NO ALLERGY Patient said that he isn't allergic to this    Latex    Other reaction(s): NO ALLERGY      Medication List      albuterol 108 (90 Base) MCG/ACT inhaler Commonly known as: VENTOLIN HFA Can inhale two puffs every four to six hours as needed for cough or wheeze.   ALPRAZolam 1 MG tablet Commonly known as: XANAX Take 1 mg by mouth 3 (three) times daily as needed for anxiety.   budesonide-formoterol 160-4.5 MCG/ACT inhaler Commonly known as: SYMBICORT Inhale 2 puffs into the lungs 2 (two) times daily.   cholecalciferol 25 MCG (1000 UNIT) tablet Commonly known as: VITAMIN D3 Take 1,000 Units by mouth daily.   famotidine 40 MG tablet Commonly known as: PEPCID Take 40 mg by mouth at bedtime.   Flovent HFA 110 MCG/ACT inhaler Generic  drug: fluticasone SMARTSIG:2 Puff(s) By Mouth Twice Daily   montelukast 10 MG tablet Commonly known as: SINGULAIR Take 1 tablet (10 mg total) by mouth at bedtime.   omeprazole 20 MG capsule Commonly known as: PRILOSEC   triamcinolone 55 MCG/ACT Aero nasal inhaler Commonly known as: NASACORT Use 1 spray in each nostril once daily   venlafaxine 75 MG tablet Commonly known as: EFFEXOR Take 75 mg by mouth 2 (two) times daily.       Past Medical History:  Diagnosis Date  . Allergy   . Anxiety   . Arthritis   . Asthma   . Blood transfusion without reported diagnosis   . Bronchitis    last had back in 2017   Sees no pulmonary md  . COPD (chronic obstructive pulmonary disease) ([place])   . Depression   . Diverticulitis   . Diverticulosis    last flare up was in Jan. 2018  Was on course of antibiotics for this  . Esophageal stricture   . Esophagus disorder    narrowing of esophagus.  Had to have dilatation 09/2016  . Family history of adverse reaction to anesthesia    son had severe N/V with his knee surgery  . Gastric polyp   . GERD (gastroesophageal reflux disease)   . Headache   . History of Clostridioides difficile colitis   . History of colon polyps   .  IBS (irritable bowel syndrome)   . Presbyesophagus   . Sleep apnea    no CPAP- unsure if true diagnosis or not  . Thrombosed external hemorrhoid     Past Surgical History:  Procedure Laterality Date  . ANTERIOR CERVICAL DECOMP/DISCECTOMY FUSION N/A 01/01/2017   Procedure: Cervical four- five and Cervical six- seven Anterior cervical discectomy with fusion and plate fixation;  Surgeon: Kevan Ny Ditty, MD;  Location: [facility];  Service: Neurosurgery;  Laterality: N/A;  C4-5 and C6-7 Anterior cervical discectomy with fusion and plate fixation  . BACK SURGERY     x 2    @ [facility]        Last one 09/2015  . CARDIAC CATHETERIZATION     He thinks.   Not really sure where at, thought it was here @ [facility], but its been a  LONG time ?2000  . COLONOSCOPY  10/22/2017   Colonic polyp status post polypectomy. Moderate predominantly sigmoid diverticulosis.   . COLONOSCOPY  2019   Dr Orlena Sheldon  . ESOPHAGOGASTRODUODENOSCOPY  06/08/2016   Distal esophageal stricture status post esophageal dilatation. Presbtesophagus, also suggestive of motility disorder. Mild gastritis.   Marland Kitchen evacuation thrombosed external hemorrhoid  2021  . NASAL SINUS SURGERY     HAS HAD 2 OR 3.   LAST ONE 2011, IN [place]  (LEFT NASAL PASSAGE IS NARROWER THAN RIGHT  . UPPER GASTROINTESTINAL ENDOSCOPY      Review of systems negative except as noted in HPI / PMHx or noted below:  Review of Systems  Constitutional: Negative.   HENT: Negative.   Eyes: Negative.   Respiratory: Negative.   Cardiovascular: Negative.   Gastrointestinal: Negative.   Genitourinary: Negative.   Musculoskeletal: Negative.   Skin: Negative.   Neurological: Negative.   Endo/Heme/Allergies: Negative.   Psychiatric/Behavioral: Negative.      Objective:   Vitals:   04/19/20 1551  BP: 94/60  Pulse: 96  Resp: 16  SpO2: 97%          Physical Exam Constitutional:      Appearance: He is not diaphoretic.  HENT:     Head: Normocephalic.     Right Ear: Tympanic membrane, ear canal and external ear normal.     Left Ear: Tympanic membrane, ear canal and external ear normal.     Nose: Nose normal. No mucosal edema or rhinorrhea.     Mouth/Throat:     Pharynx: Uvula midline. No oropharyngeal exudate.  Eyes:     Conjunctiva/sclera: Conjunctivae normal.  Neck:     Thyroid: No thyromegaly.     Trachea: Trachea normal. No tracheal tenderness or tracheal deviation.  Cardiovascular:     Rate and Rhythm: Normal rate and regular rhythm.     Heart sounds: Normal heart sounds, S1 normal and S2 normal. No murmur.  Pulmonary:     Effort: No respiratory distress.     Breath sounds: Normal breath sounds. No stridor. No wheezing or rales.  Lymphadenopathy:     Head:      Right side of head: No tonsillar adenopathy.     Left side of head: No tonsillar adenopathy.     Cervical: No cervical adenopathy.  Skin:    Findings: No erythema or rash.     Nails: There is no clubbing.  Neurological:     Mental Status: He is alert.     Diagnostics:    Spirometry was performed and demonstrated an FEV1 of 3.04 at 81 % of predicted.  Assessment and Plan:   1. Asthma, moderate persistent, well-controlled   2. Perennial allergic rhinitis   3. Gastroesophageal reflux disease, unspecified whether esophagitis present   4. 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  Continue to treat and prevent inflammation:   A.  OTC Nasacort-1 spray each nostril 1 time per day  B.  Symbicort 160- 2 inhalations 1-2  times per day     C.  Montelukast 10 mg - 1 tablet 1 time per day  2.  If needed:   A.  Nasal saline  B.  Albuterol HFA-2 inhalations every 4-6 hours  3.  Continue to treat reflux with the following:   A.  Omeprazole 40 mg - 1 tablet in AM   B. Famotidine 40 mg - 1 tablet in PM  4.  Add Flovent 110 - 2 inhalations 2 times per day to Symbicort during increased asthma activity  5.  Return to clinic in 12 weeks or earlier if problem  Brandon Gallagher appears to be doing quite well and he appears to be recovering from his Covid infection with pulmonary comorbidity.  He has a very good understanding of his medications and the appropriate dosing of his medications depending on disease activity.  He will continue on anti-inflammatory agents for his airway and therapy directed against reflux.  I will see him back in his clinic in 12 weeks or earlier if there is a problem.  Allena Katz, MD Allergy / Immunology [place]

## 2020-04-19 NOTE — Patient Instructions (Signed)
  1.  Continue to treat and prevent inflammation:   A.  OTC Nasacort-1 spray each nostril 1 time per day  B.  Symbicort 160- 2 inhalations 1-2  times per day     C.  Montelukast 10 mg - 1 tablet 1 time per day  2.  If needed:   A.  Nasal saline  B.  Albuterol HFA-2 inhalations every 4-6 hours  3.  Continue to treat reflux with the following:   A.  Omeprazole 40 mg - 1 tablet in AM   B. Famotidine 40 mg - 1 tablet in PM  4.  Add Flovent 110 - 2 inhalations 2 times per day to Symbicort during increased asthma activity  5.  Return to clinic in 12 weeks or earlier if problem

## 2020-04-20 ENCOUNTER — Encounter: Payer: Self-pay | Admitting: Allergy and Immunology

## 2020-04-20 ENCOUNTER — Other Ambulatory Visit: Payer: Self-pay | Admitting: *Deleted

## 2020-04-20 DIAGNOSIS — M779 Enthesopathy, unspecified: Secondary | ICD-10-CM | POA: Diagnosis not present

## 2020-04-20 DIAGNOSIS — R531 Weakness: Secondary | ICD-10-CM | POA: Diagnosis not present

## 2020-04-20 DIAGNOSIS — M25571 Pain in right ankle and joints of right foot: Secondary | ICD-10-CM | POA: Diagnosis not present

## 2020-04-20 DIAGNOSIS — M79671 Pain in right foot: Secondary | ICD-10-CM | POA: Diagnosis not present

## 2020-04-20 DIAGNOSIS — R2681 Unsteadiness on feet: Secondary | ICD-10-CM | POA: Diagnosis not present

## 2020-04-20 MED ORDER — BUDESONIDE-FORMOTEROL FUMARATE 160-4.5 MCG/ACT IN AERO: INHALATION_SPRAY | RESPIRATORY_TRACT | 5 refills | Status: DC

## 2020-04-23 DIAGNOSIS — R2681 Unsteadiness on feet: Secondary | ICD-10-CM | POA: Diagnosis not present

## 2020-04-23 DIAGNOSIS — R531 Weakness: Secondary | ICD-10-CM | POA: Diagnosis not present

## 2020-04-23 DIAGNOSIS — M25571 Pain in right ankle and joints of right foot: Secondary | ICD-10-CM | POA: Diagnosis not present

## 2020-04-23 DIAGNOSIS — M779 Enthesopathy, unspecified: Secondary | ICD-10-CM | POA: Diagnosis not present

## 2020-04-23 DIAGNOSIS — M79671 Pain in right foot: Secondary | ICD-10-CM | POA: Diagnosis not present

## 2020-04-28 DIAGNOSIS — R2681 Unsteadiness on feet: Secondary | ICD-10-CM | POA: Diagnosis not present

## 2020-04-28 DIAGNOSIS — R531 Weakness: Secondary | ICD-10-CM | POA: Diagnosis not present

## 2020-04-28 DIAGNOSIS — M79671 Pain in right foot: Secondary | ICD-10-CM | POA: Diagnosis not present

## 2020-04-28 DIAGNOSIS — M779 Enthesopathy, unspecified: Secondary | ICD-10-CM | POA: Diagnosis not present

## 2020-04-28 DIAGNOSIS — M25571 Pain in right ankle and joints of right foot: Secondary | ICD-10-CM | POA: Diagnosis not present

## 2020-04-29 DIAGNOSIS — K5732 Diverticulitis of large intestine without perforation or abscess without bleeding: Secondary | ICD-10-CM | POA: Diagnosis not present

## 2020-05-05 ENCOUNTER — Other Ambulatory Visit: Payer: Self-pay | Admitting: Allergy and Immunology

## 2020-05-05 DIAGNOSIS — J4540 Moderate persistent asthma, uncomplicated: Secondary | ICD-10-CM

## 2020-05-06 DIAGNOSIS — M779 Enthesopathy, unspecified: Secondary | ICD-10-CM | POA: Diagnosis not present

## 2020-05-06 DIAGNOSIS — R531 Weakness: Secondary | ICD-10-CM | POA: Diagnosis not present

## 2020-05-06 DIAGNOSIS — M25571 Pain in right ankle and joints of right foot: Secondary | ICD-10-CM | POA: Diagnosis not present

## 2020-05-06 DIAGNOSIS — R2681 Unsteadiness on feet: Secondary | ICD-10-CM | POA: Diagnosis not present

## 2020-05-06 DIAGNOSIS — M79671 Pain in right foot: Secondary | ICD-10-CM | POA: Diagnosis not present

## 2020-05-07 ENCOUNTER — Telehealth: Payer: Self-pay | Admitting: Physician Assistant

## 2020-05-07 DIAGNOSIS — M779 Enthesopathy, unspecified: Secondary | ICD-10-CM | POA: Diagnosis not present

## 2020-05-07 DIAGNOSIS — R2681 Unsteadiness on feet: Secondary | ICD-10-CM | POA: Diagnosis not present

## 2020-05-07 DIAGNOSIS — R531 Weakness: Secondary | ICD-10-CM | POA: Diagnosis not present

## 2020-05-07 DIAGNOSIS — M79671 Pain in right foot: Secondary | ICD-10-CM | POA: Diagnosis not present

## 2020-05-07 DIAGNOSIS — M25571 Pain in right ankle and joints of right foot: Secondary | ICD-10-CM | POA: Diagnosis not present

## 2020-05-07 NOTE — Telephone Encounter (Signed)
I placed a call to the pt and he tells me he spoke with his surgeon and all his questions have been answered.

## 2020-05-10 DIAGNOSIS — K5792 Diverticulitis of intestine, part unspecified, without perforation or abscess without bleeding: Secondary | ICD-10-CM | POA: Diagnosis not present

## 2020-05-24 ENCOUNTER — Telehealth: Payer: Self-pay | Admitting: Physician Assistant

## 2020-05-26 NOTE — Telephone Encounter (Signed)
Pt called again about this message. He stated that his hemorrhoids have been acting up due to the diarrhea that he has been experiencing. He is scheduled for colon resection soon and stated that surgeon will not perform surgery if pt has issues with hemorrhoids.

## 2020-05-26 NOTE — Telephone Encounter (Signed)
Spoke to patient this morning who states that he was able to have a bowel movement but now is having hemorrhoid pain. Patient is concerned that he will not be able to have colon surgery if his hemorrhoids become to inflamed. Patient was advised to use Preparation H Suppositories that he already has on hand. His surgery is scheduled for 06/02/20. He was advised to keep his stools soft by taking his prescribed metamucil,and drinking plenty of fluids. He will contact the office later this week if he does not get relief from his hemorrhoids. All questions answered. Patient voiced understanding.

## 2020-05-28 ENCOUNTER — Ambulatory Visit: Payer: Medicare Other | Admitting: Sports Medicine

## 2020-05-31 DIAGNOSIS — K5792 Diverticulitis of intestine, part unspecified, without perforation or abscess without bleeding: Secondary | ICD-10-CM | POA: Diagnosis not present

## 2020-05-31 DIAGNOSIS — J3489 Other specified disorders of nose and nasal sinuses: Secondary | ICD-10-CM | POA: Diagnosis not present

## 2020-06-01 ENCOUNTER — Telehealth: Payer: Self-pay | Admitting: Urology

## 2020-06-02 ENCOUNTER — Other Ambulatory Visit: Payer: Self-pay | Admitting: Sports Medicine

## 2020-06-02 ENCOUNTER — Ambulatory Visit: Payer: Medicare Other | Admitting: Sports Medicine

## 2020-06-02 ENCOUNTER — Encounter: Payer: Self-pay | Admitting: Sports Medicine

## 2020-06-02 ENCOUNTER — Other Ambulatory Visit: Payer: Self-pay

## 2020-06-02 DIAGNOSIS — M779 Enthesopathy, unspecified: Secondary | ICD-10-CM | POA: Diagnosis not present

## 2020-06-02 DIAGNOSIS — M2040 Other hammer toe(s) (acquired), unspecified foot: Secondary | ICD-10-CM | POA: Diagnosis not present

## 2020-06-02 DIAGNOSIS — R2681 Unsteadiness on feet: Secondary | ICD-10-CM

## 2020-06-02 DIAGNOSIS — G629 Polyneuropathy, unspecified: Secondary | ICD-10-CM | POA: Diagnosis not present

## 2020-06-02 DIAGNOSIS — M25571 Pain in right ankle and joints of right foot: Secondary | ICD-10-CM

## 2020-06-02 DIAGNOSIS — M79671 Pain in right foot: Secondary | ICD-10-CM

## 2020-06-02 DIAGNOSIS — M79672 Pain in left foot: Secondary | ICD-10-CM

## 2020-06-02 NOTE — Progress Notes (Signed)
Subjective: Brandon Gallagher is a 59 y.o. male patient who presents to office for evaluation of Right> Left foot pain at the heel as well as concern of redness to the toes especially the big toes where there is rubbing over the knuckle joints when he is in his shoes.  Patient reports that the pain still comes and goes in his heel and is the same 3 out of 10 worse with certain movements and activities reports that he is not wearing his cam boot or ankle brace and he did not start physical therapist due to cost but will be having surgery soon and wants to have his toes check because he was worried about getting sores over the.  Patient denies any other pedal complaints at this time still struggling with his gait and history of bad back will be going to surgery for this soon.  Patient Active Problem List   Diagnosis Date Noted  . Acute respiratory disease due to COVID-19 virus 10/30/2019  . Acute respiratory failure with hypoxia ([place]) 10/30/2019  . 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 Acute hypoxemic respiratory failure due to COVID-19 ([place]) 10/30/2019  . Sinus bradycardia by electrocardiogram 10/30/2019  . Pain in joint, ankle and foot 11/22/2018  . Cervical spondylosis with radiculopathy 01/01/2017  . Sacroiliac joint pain 07/27/2016  . Chronic bilateral low back pain without sciatica 01/10/2016  . Fall 01/10/2016  . S/P lumbar laminectomy 11/01/2015  . Asthma 10/06/2015  . GERD (gastroesophageal reflux disease) 10/06/2015  . OSA (obstructive sleep apnea) 10/06/2015  . Morbid obesity due to excess calories ([place]) 07/28/2015    Current Outpatient Medications on File Prior to Visit  Medication Sig Dispense Refill  . albuterol (VENTOLIN HFA) 108 (90 Base) MCG/ACT inhaler Can inhale two puffs every four to six hours as needed for cough or wheeze. 18 g 1  . ALPRAZolam (XANAX) 1 MG tablet Take 1 mg by mouth 3 (three) times daily as needed for anxiety.     . budesonide-formoterol (SYMBICORT) 160-4.5  MCG/ACT inhaler Inhale two puffs twice daily to prevent cough or wheeze. Rinse mouth after use. 1 Inhaler 5  . cholecalciferol (VITAMIN D3) 25 MCG (1000 UT) tablet Take 1,000 Units by mouth daily.    . famotidine (PEPCID) 40 MG tablet Take 40 mg by mouth at bedtime.     Marland Kitchen FLOVENT HFA 110 MCG/ACT inhaler SMARTSIG:2 Puff(s) By Mouth Twice Daily    . montelukast (SINGULAIR) 10 MG tablet TAKE (1) TABLET BY MOUTH AT BEDTIME 30 tablet 1  . omeprazole (PRILOSEC) 20 MG capsule     . triamcinolone (NASACORT) 55 MCG/ACT AERO nasal inhaler Use 1 spray in each nostril once daily 1 Inhaler 5  . venlafaxine (EFFEXOR) 75 MG tablet Take 75 mg by mouth 2 (two) times daily.     No current facility-administered medications on file prior to visit.    Allergies  Allergen Reactions  . Iodinated Diagnostic Agents     Other reaction(s): NO ALLERGY Patient said that he isn't allergic to this   . Latex     Other reaction(s): NO ALLERGY    Objective:  General: Alert and oriented x3 in no acute distress  Dermatology: Blanchable erythema overlying 1-5 PIPJ dorsally bilateral.  With mild erythema noted at bilateral first toes.  No open lesions bilateral lower extremities, no webspace macerations, no ecchymosis bilateral, all nails x 10 are short and thickened.  Vascular: Dorsalis Pedis and Posterior Tibial pedal pulses 1/4, Capillary Fill Time 5 seconds,(+) scant  pedal hair growth bilateral, trace edema bilateral lower extremities, varicosities bilateral temperature gradient within normal limits.  Neurology: Gross sensation intact via light touch bilateral, protective sensation severely diminished bilateral history of neuropathy.  Musculoskeletal: Rigid hammertoes 1 through 10 bilateral with most contracture noted at bilateral hallux.  There is pain to palpation at the Achilles insertion on the right with significant equinus deformity noted bilateral.  Gait: Instability       Assessment and Plan: Problem List  Items Addressed This Visit    None    Visit Diagnoses    Acquired hammertoe    -  Primary   Tendonitis       Right ankle pain, unspecified chronicity       Neuropathy       Right foot pain       Gait instability           -Complete examination performed -Discussed treatment options for hammertoes and ongoing tendinitis of pain at the right in the setting of neuropathy that is severe with history of radiculopathy and gait instability -Dispensed toe caps for patient to use to hammertoes avoid rubbing in his shoes -Advised good supportive shoes with a foot type -Continue with cane for stability in gait -Advised patient of neuropathy will likely continue to progress and to continue with medication management for this as well as follow-up weightbearing as back doctor for proposed procedures to see if this will get him some benefit for the neuropathy -Advised patient that after his back surgery he may benefit from physical therapy for his ongoing tendinitis pain meanwhile continue with gentle stretching and icing as tolerated to the back of the right heel also recommended over-the-counter pain cream as well -Patient to return to office as needed or sooner if condition worsens.  Landis Martins, DPM

## 2020-06-04 DIAGNOSIS — Z298 Encounter for other specified prophylactic measures: Secondary | ICD-10-CM | POA: Diagnosis not present

## 2020-06-04 DIAGNOSIS — K5732 Diverticulitis of large intestine without perforation or abscess without bleeding: Secondary | ICD-10-CM | POA: Diagnosis not present

## 2020-06-04 DIAGNOSIS — K5730 Diverticulosis of large intestine without perforation or abscess without bleeding: Secondary | ICD-10-CM | POA: Diagnosis not present

## 2020-06-04 DIAGNOSIS — Z79899 Other long term (current) drug therapy: Secondary | ICD-10-CM | POA: Diagnosis not present

## 2020-06-04 DIAGNOSIS — K5790 Diverticulosis of intestine, part unspecified, without perforation or abscess without bleeding: Secondary | ICD-10-CM | POA: Diagnosis not present

## 2020-06-04 DIAGNOSIS — G8918 Other acute postprocedural pain: Secondary | ICD-10-CM | POA: Diagnosis not present

## 2020-06-04 DIAGNOSIS — J45909 Unspecified asthma, uncomplicated: Secondary | ICD-10-CM | POA: Diagnosis not present

## 2020-06-04 DIAGNOSIS — Z8616 Personal history of COVID-19: Secondary | ICD-10-CM | POA: Diagnosis not present

## 2020-06-04 DIAGNOSIS — Z23 Encounter for immunization: Secondary | ICD-10-CM | POA: Diagnosis not present

## 2020-06-04 DIAGNOSIS — Z87891 Personal history of nicotine dependence: Secondary | ICD-10-CM | POA: Diagnosis not present

## 2020-06-04 DIAGNOSIS — Z7951 Long term (current) use of inhaled steroids: Secondary | ICD-10-CM | POA: Diagnosis not present

## 2020-06-07 DIAGNOSIS — K5732 Diverticulitis of large intestine without perforation or abscess without bleeding: Secondary | ICD-10-CM

## 2020-06-08 ENCOUNTER — Telehealth: Payer: Self-pay | Admitting: Cardiology

## 2020-06-08 NOTE — Telephone Encounter (Signed)
Brandon Gallagher is calling stating he was recently released from [facility] and they advised him to schedule a f/u with Dr. Bettina Gavia. He states he prefers to be seen in [place]. Please advise.

## 2020-06-19 DIAGNOSIS — H6123 Impacted cerumen, bilateral: Secondary | ICD-10-CM | POA: Diagnosis not present

## 2020-06-22 ENCOUNTER — Other Ambulatory Visit: Payer: Self-pay

## 2020-06-22 DIAGNOSIS — K317 Polyp of stomach and duodenum: Secondary | ICD-10-CM | POA: Insufficient documentation

## 2020-06-22 DIAGNOSIS — Z8489 Family history of other specified conditions: Secondary | ICD-10-CM | POA: Insufficient documentation

## 2020-06-22 DIAGNOSIS — R519 Headache, unspecified: Secondary | ICD-10-CM | POA: Insufficient documentation

## 2020-06-22 DIAGNOSIS — J40 Bronchitis, not specified as acute or chronic: Secondary | ICD-10-CM | POA: Insufficient documentation

## 2020-06-22 DIAGNOSIS — K2289 Other specified disease of esophagus: Secondary | ICD-10-CM | POA: Insufficient documentation

## 2020-06-22 DIAGNOSIS — K5792 Diverticulitis of intestine, part unspecified, without perforation or abscess without bleeding: Secondary | ICD-10-CM | POA: Insufficient documentation

## 2020-06-22 DIAGNOSIS — K589 Irritable bowel syndrome without diarrhea: Secondary | ICD-10-CM | POA: Insufficient documentation

## 2020-06-22 DIAGNOSIS — F419 Anxiety disorder, unspecified: Secondary | ICD-10-CM | POA: Insufficient documentation

## 2020-06-22 DIAGNOSIS — M1990 Unspecified osteoarthritis, unspecified site: Secondary | ICD-10-CM | POA: Insufficient documentation

## 2020-06-22 DIAGNOSIS — G4730 Sleep apnea, unspecified: Secondary | ICD-10-CM | POA: Insufficient documentation

## 2020-06-22 DIAGNOSIS — K5790 Diverticulosis of intestine, part unspecified, without perforation or abscess without bleeding: Secondary | ICD-10-CM | POA: Insufficient documentation

## 2020-06-22 DIAGNOSIS — F32A Depression, unspecified: Secondary | ICD-10-CM | POA: Insufficient documentation

## 2020-06-22 DIAGNOSIS — Z86010 Personal history of colonic polyps: Secondary | ICD-10-CM | POA: Insufficient documentation

## 2020-06-22 DIAGNOSIS — K645 Perianal venous thrombosis: Secondary | ICD-10-CM | POA: Insufficient documentation

## 2020-06-22 DIAGNOSIS — K229 Disease of esophagus, unspecified: Secondary | ICD-10-CM | POA: Insufficient documentation

## 2020-06-22 DIAGNOSIS — J449 Chronic obstructive pulmonary disease, unspecified: Secondary | ICD-10-CM | POA: Insufficient documentation

## 2020-06-22 DIAGNOSIS — K222 Esophageal obstruction: Secondary | ICD-10-CM | POA: Insufficient documentation

## 2020-06-22 DIAGNOSIS — Z5189 Encounter for other specified aftercare: Secondary | ICD-10-CM | POA: Insufficient documentation

## 2020-06-22 DIAGNOSIS — Z8619 Personal history of other infectious and parasitic diseases: Secondary | ICD-10-CM | POA: Insufficient documentation

## 2020-06-22 DIAGNOSIS — T7840XA Allergy, unspecified, initial encounter: Secondary | ICD-10-CM | POA: Insufficient documentation

## 2020-06-29 ENCOUNTER — Encounter: Payer: Self-pay | Admitting: Cardiology

## 2020-07-01 NOTE — Progress Notes (Signed)
Cardiology Office Note:    Date:  07/02/2020   ID:  Brandon Gallagher, DOB August 19, 1961, MRN [id number]  PCP:  Nicoletta Dress, MD  Cardiologist:  Shirlee More, MD   Referring MD: Nicoletta Dress, MD  ASSESSMENT:    1. Bradycardia   2. Diverticulitis of large intestine with abscess without bleeding    PLAN:    In order of problems listed above:  1. He was noted in hospital to have asymptomatic sinus bradycardia takes no rate slowing medications has no exercise intolerance and a normal EKG and heart rate today.  This may have been a response to his diverticulitis surgery recovery however will do a 3-day ZIO monitor to assess his heart rate variation and whether he has significant bradycardia. 2. Improved recovered from surgery  Next appointment as needed   Medication Adjustments/Labs and Tests Ordered: Current medicines are reviewed at length with the patient today.  Concerns regarding medicines are outlined above.  Orders Placed This Encounter  Procedures  . LONG TERM MONITOR (3-14 DAYS)  . EKG 12-Lead   No orders of the defined types were placed in this encounter.    Chief Complaint  Patient presents with  . Bradycardia    History of Present Illness:    Brandon Gallagher is a 59 y.o. male who is being seen today for the evaluation of bradycardia at the request of Nicoletta Dress, MD. He was admitted to [facility] 06/04/2020 to 06/08/2020 with diverticulitis. EKG from the hospital independently reviewed sinus bradycardia otherwise normal.  While in hospital he was told his heart rate was slow less than 50 bpm but nobody did EKGs or put him on a monitor.  He has never had any heart disease since his surgery feels quite well he has been vigorous activities walking no exercise intolerance palpitations syncope or chest pain.  He has a very strong family history of heart disease and for further evaluation we decided to place a 3-day ZIO monitor to assess heart rate  once recovered from his acute medical illness.  Mild sinus bradycardia on his hospital EKG.  He takes no medications that suppress his heart rate. Past Medical History:  Diagnosis Date  . Acute respiratory disease due to COVID-19 virus 10/30/2019  . Acute respiratory failure with hypoxia ([place]) 10/30/2019  . Allergy   . Anxiety   . Arthritis   . Asthma   . Blood transfusion without reported diagnosis   . Bronchitis    last had back in 2017   Sees no pulmonary md  . Cervical spondylosis with radiculopathy 01/01/2017  . Chronic bilateral low back pain without sciatica 01/10/2016  . COPD (chronic obstructive pulmonary disease) ([place])   . Depression   . Diverticulitis of sigmoid colon 02/05/2020  . Diverticulosis    last flare up was in Jan. 2018  Was on course of antibiotics for this  . Esophageal stricture   . Esophagus disorder    narrowing of esophagus.  Had to have dilatation 09/2016  . Fall 01/10/2016  . Family history of adverse reaction to anesthesia    son had severe N/V with his knee surgery  . Gastric polyp   . GERD (gastroesophageal reflux disease)   . Headache   . History of Clostridioides difficile colitis   . History of colon polyps   . IBS (irritable bowel syndrome)   . 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 Morbid obesity due to excess calories ([place]) 07/28/2015  . OSA (obstructive  sleep apnea) 10/06/2015  . Pain in joint, ankle and foot 11/22/2018   Right:  . Presbyesophagus   . S/P lumbar laminectomy 11/01/2015   Overview:  AXIALIF L5-S1 with percutaneous robot assisted pedicle scre and rod instrumentation on 08/20/15- Dr. Elnoria Howard   Overview:  S/P minimally invasive lumbar laminectomy L3-4 performed on 10/08/2015 by Dr. Elnoria Howard   . Sacroiliac joint pain 07/27/2016  . Sinus bradycardia by electrocardiogram 10/30/2019  . Sleep apnea    no CPAP- unsure if true diagnosis or not  . Thrombosed external hemorrhoid     Past Surgical History:  Procedure Laterality Date  . ANTERIOR  CERVICAL DECOMP/DISCECTOMY FUSION N/A 01/01/2017   Procedure: Cervical four- five and Cervical six- seven Anterior cervical discectomy with fusion and plate fixation;  Surgeon: Kevan Ny Ditty, MD;  Location: [facility];  Service: Neurosurgery;  Laterality: N/A;  C4-5 and C6-7 Anterior cervical discectomy with fusion and plate fixation  . BACK SURGERY     x 2    @ [facility]        Last one 09/2015  . CARDIAC CATHETERIZATION     He thinks.   Not really sure where at, thought it was here @ [facility], but its been a LONG time ?2000  . CARPAL TUNNEL RELEASE Bilateral   . COLONOSCOPY  10/22/2017   Colonic polyp status post polypectomy. Moderate predominantly sigmoid diverticulosis.   . COLONOSCOPY  2019   Dr Orlena Sheldon  . ESOPHAGOGASTRODUODENOSCOPY  06/08/2016   Distal esophageal stricture status post esophageal dilatation. Presbtesophagus, also suggestive of motility disorder. Mild gastritis.   Marland Kitchen evacuation thrombosed external hemorrhoid  2021  . KNEE SURGERY Right   . LUMBAR LAMINECTOMY  10/08/2015   L3-4, Surgeon Deirdre Evener, MD  . NASAL SINUS SURGERY     HAS HAD 2 OR 3.   LAST ONE 2011, IN [place]  (LEFT NASAL PASSAGE IS NARROWER THAN RIGHT  . SACROILIAC JOINT INJECTION Bilateral 01/10/2016  . UPPER GASTROINTESTINAL ENDOSCOPY      Current Medications: Current Meds  Medication Sig  . acetaminophen (TYLENOL) 500 MG tablet Take 500 mg by mouth every 6 (six) hours as needed.  Marland Kitchen albuterol (VENTOLIN HFA) 108 (90 Base) MCG/ACT inhaler Can inhale two puffs every four to six hours as needed for cough or wheeze.  Marland Kitchen ALPRAZolam (XANAX) 1 MG tablet Take 1 mg by mouth 3 (three) times daily as needed for anxiety.   . budesonide-formoterol (SYMBICORT) 160-4.5 MCG/ACT inhaler Inhale two puffs twice daily to prevent cough or wheeze. Rinse mouth after use.  . cholecalciferol (VITAMIN D3) 25 MCG (1000 UT) tablet Take 1,000 Units by mouth daily.  . diphenhydramine-acetaminophen (TYLENOL PM) 25-500 MG TABS  tablet Take 2 tablets by mouth at bedtime as needed.  . famotidine (PEPCID) 20 MG tablet Take 20 mg by mouth 2 (two) times daily.  . Meth-Hyo-M Bl-Na Phos-Ph Sal (URIBEL) 118 MG CAPS Take 1 capsule by mouth in the morning and at bedtime.  . montelukast (SINGULAIR) 10 MG tablet TAKE (1) TABLET BY MOUTH AT BEDTIME  . omeprazole (PRILOSEC) 20 MG capsule   . oxyCODONE (OXY IR/ROXICODONE) 5 MG immediate release tablet Take 5 mg by mouth every 6 (six) hours as needed.  . venlafaxine (EFFEXOR) 75 MG tablet Take 75 mg by mouth 2 (two) times daily.  . [DISCONTINUED] ammonium lactate (LAC-HYDRIN) 12 % lotion Apply 1 application topically as needed for dry skin.     Allergies:   Patient has no known allergies.  Social History   Socioeconomic History  . Marital status: Married    Spouse name: Not on file  . Number of children: 3  . Years of education: Not on file  . Highest education level: Not on file  Occupational History  . Occupation: Disablilty  Tobacco Use  . Smoking status: Former Smoker    Packs/day: 1.00    Years: 30.00    Pack years: 30.00    Types: Cigarettes    Quit date: 05/12/2000    Years since quitting: 20.1  . Smokeless tobacco: Never Used  Vaping Use  . Vaping Use: Never used  Substance and Sexual Activity  . Alcohol use: No    Alcohol/week: 0.0 standard drinks  . Drug use: Not Currently  . Sexual activity: Not on file  Other Topics Concern  . Not on file  Social History Narrative  . Not on file   Social Determinants of Health   Financial Resource Strain:   . Difficulty of Paying Living Expenses: Not on file  Food Insecurity:   . Worried About Charity fundraiser in the Last Year: Not on file  . Ran Out of Food in the Last Year: Not on file  Transportation Needs:   . Lack of Transportation (Medical): Not on file  . Lack of Transportation (Non-Medical): Not on file  Physical Activity:   . Days of Exercise per Week: Not on file  . Minutes of Exercise per  Session: Not on file  Stress:   . Feeling of Stress : Not on file  Social Connections:   . Frequency of Communication with Friends and Family: Not on file  . Frequency of Social Gatherings with Friends and Family: Not on file  . Attends Religious Services: Not on file  . Active Member of Clubs or Organizations: Not on file  . Attends Archivist Meetings: Not on file  . Marital Status: Not on file     Family History: The patient's family history includes Heart disease in his mother; Stroke in his father. There is no history of Colon cancer, Esophageal cancer, Stomach cancer, Diabetes, or Rectal cancer.  ROS:   ROS Please see the history of present illness.     All other systems reviewed and are negative.  EKGs/Labs/Other Studies Reviewed:    The following studies were reviewed today:   EKG:  EKG is  ordered today.  The ekg ordered today is personally reviewed and demonstrates sinus rhythm normal conduction intervals normal QRS rate 72 bpm  Recent Labs: 10/30/2019: B Natriuretic Peptide 255.5; TSH 0.308 11/05/2019: ALT 29; BUN 17; Creatinine, Ser 1.00; Hemoglobin 15.8; Magnesium 2.2; Platelets 354; Potassium 5.0; Sodium 136  Recent Lipid Panel 03/03/2020 lipids at target cholesterol 130 triglycerides 70 HDL 59 LDL 57 A1c at target 5.8%  Physical Exam:    VS:  BP 106/70   Pulse 77   Ht 5\' 11"  (1.803 m)   Wt 244 lb (110.7 kg)   SpO2 96%   BMI 34.03 kg/m     Wt Readings from Last 3 Encounters:  07/02/20 244 lb (110.7 kg)  03/11/20 250 lb (113.4 kg)  02/18/20 251 lb (113.9 kg)     GEN:  Well nourished, well developed in no acute distress HEENT: Normal NECK: No JVD; No carotid bruits LYMPHATICS: No lymphadenopathy CARDIAC: RRR, no murmurs, rubs, gallops RESPIRATORY:  Clear to auscultation without rales, wheezing or rhonchi  ABDOMEN: Soft, non-tender, non-distended MUSCULOSKELETAL:  No edema; No deformity  SKIN: Warm  and dry NEUROLOGIC:  Alert and oriented x  3 PSYCHIATRIC:  Normal affect     Signed, Shirlee More, MD  07/02/2020 3:17 PM    [facility]

## 2020-07-02 ENCOUNTER — Other Ambulatory Visit: Payer: Self-pay

## 2020-07-02 ENCOUNTER — Ambulatory Visit: Payer: Medicare Other | Admitting: Cardiology

## 2020-07-02 ENCOUNTER — Ambulatory Visit (INDEPENDENT_AMBULATORY_CARE_PROVIDER_SITE_OTHER): Payer: Medicare Other

## 2020-07-02 ENCOUNTER — Encounter: Payer: Self-pay | Admitting: Cardiology

## 2020-07-02 VITALS — BP 106/70 | HR 77 | Ht 71.0 in | Wt 244.0 lb

## 2020-07-02 DIAGNOSIS — K5720 Diverticulitis of large intestine with perforation and abscess without bleeding: Secondary | ICD-10-CM | POA: Diagnosis not present

## 2020-07-02 DIAGNOSIS — R001 Bradycardia, unspecified: Secondary | ICD-10-CM

## 2020-07-02 NOTE — Patient Instructions (Signed)
Medication Instructions:  Your physician recommends that you continue on your current medications as directed. Please refer to the Current Medication list given to you today.  *If you need a refill on your cardiac medications before your next appointment, please call your pharmacy*   Lab Work: None If you have labs (blood work) drawn today and your tests are completely normal, you will receive your results only by: Marland Kitchen MyChart Message (if you have MyChart) OR . A paper copy in the mail If you have any lab test that is abnormal or we need to change your treatment, we will call you to review the results.   Testing/Procedures: A zio monitor was ordered today. It will remain on for 3 days. You will then return monitor and event diary in provided box. It takes 1-2 weeks for report to be downloaded and returned to [place]. We will call you with the results. If monitor falls off or has orange flashing light, please call Zio for further instructions.      Follow-Up: At [facility], you and your health needs are our priority.  As part of our continuing mission to provide you with exceptional heart care, we have created designated Provider Care Teams.  These Care Teams include your primary Cardiologist (physician) and Advanced Practice Providers (APPs -  Physician Assistants and Nurse Practitioners) who all work together to provide you with the care you need, when you need it.  We recommend signing up for the patient portal called "MyChart".  Sign up information is provided on this After Visit Summary.  MyChart is used to connect with patients for Virtual Visits (Telemedicine).  Patients are able to view lab/test results, encounter notes, upcoming appointments, etc.  Non-urgent messages can be sent to your provider as well.   To learn more about what you can do with MyChart, go to [email or web address].    Your next appointment:   PRN follow up [facility]  The format for your next appointment:   In  Person  Provider:   Shirlee More, MD   Other Instructions

## 2020-07-06 ENCOUNTER — Other Ambulatory Visit: Payer: Self-pay | Admitting: Allergy and Immunology

## 2020-07-06 DIAGNOSIS — H60311 Diffuse otitis externa, right ear: Secondary | ICD-10-CM | POA: Insufficient documentation

## 2020-07-09 ENCOUNTER — Ambulatory Visit: Payer: Medicare Other | Admitting: Sports Medicine

## 2020-07-12 DIAGNOSIS — G4700 Insomnia, unspecified: Secondary | ICD-10-CM | POA: Diagnosis not present

## 2020-07-12 DIAGNOSIS — H6691 Otitis media, unspecified, right ear: Secondary | ICD-10-CM | POA: Diagnosis not present

## 2020-07-13 ENCOUNTER — Ambulatory Visit: Payer: Medicare Other | Admitting: Sports Medicine

## 2020-07-13 ENCOUNTER — Ambulatory Visit: Payer: Medicare Other | Admitting: Gastroenterology

## 2020-07-14 DIAGNOSIS — R001 Bradycardia, unspecified: Secondary | ICD-10-CM | POA: Diagnosis not present

## 2020-07-15 ENCOUNTER — Telehealth: Payer: Self-pay | Admitting: Allergy and Immunology

## 2020-07-15 NOTE — Telephone Encounter (Signed)
Brandon Gallagher called in and stated that Symbicort and Flovent have gone up in price and want to know if Dr. Neldon Mc still wants him to use them.  He states if he doesn't have to use them then he would save money.  Please advise.

## 2020-07-16 ENCOUNTER — Telehealth: Payer: Self-pay

## 2020-07-16 NOTE — Telephone Encounter (Signed)
-----   Message from Richardo Priest, MD sent at 07/16/2020  9:51 AM EDT ----- Good result there are no slower heart rates to be concerned about I do not think he needs any further follow-up in my office.

## 2020-07-16 NOTE — Telephone Encounter (Signed)
Spoke with patient regarding results and recommendation.  Patient verbalizes understanding and is agreeable to plan of care. Advised patient to call back with any issues or concerns.  

## 2020-07-20 ENCOUNTER — Other Ambulatory Visit: Payer: Self-pay | Admitting: Allergy and Immunology

## 2020-07-26 NOTE — Telephone Encounter (Signed)
Left message to call the office.

## 2020-07-26 NOTE — Telephone Encounter (Signed)
Patient informed. 

## 2020-07-26 NOTE — Telephone Encounter (Signed)
Please inform patient that he should remain on these agents until his next appointment.

## 2020-07-28 ENCOUNTER — Other Ambulatory Visit: Payer: Self-pay | Admitting: Allergy and Immunology

## 2020-07-28 DIAGNOSIS — J4540 Moderate persistent asthma, uncomplicated: Secondary | ICD-10-CM

## 2020-08-02 ENCOUNTER — Telehealth: Payer: Self-pay | Admitting: Physician Assistant

## 2020-08-02 ENCOUNTER — Encounter: Payer: Self-pay | Admitting: Allergy and Immunology

## 2020-08-02 ENCOUNTER — Ambulatory Visit (INDEPENDENT_AMBULATORY_CARE_PROVIDER_SITE_OTHER): Payer: Medicare Other | Admitting: Allergy and Immunology

## 2020-08-02 ENCOUNTER — Other Ambulatory Visit: Payer: Self-pay

## 2020-08-02 VITALS — BP 92/56 | HR 72 | Resp 18

## 2020-08-02 DIAGNOSIS — J3089 Other allergic rhinitis: Secondary | ICD-10-CM | POA: Diagnosis not present

## 2020-08-02 DIAGNOSIS — J4540 Moderate persistent asthma, uncomplicated: Secondary | ICD-10-CM

## 2020-08-02 DIAGNOSIS — D802 Selective deficiency of immunoglobulin A [IgA]: Secondary | ICD-10-CM

## 2020-08-02 DIAGNOSIS — K219 Gastro-esophageal reflux disease without esophagitis: Secondary | ICD-10-CM | POA: Diagnosis not present

## 2020-08-02 MED ORDER — FAMOTIDINE 40 MG PO TABS: ORAL_TABLET | ORAL | 5 refills | Status: AC

## 2020-08-02 MED ORDER — OMEPRAZOLE 40 MG PO CPDR: DELAYED_RELEASE_CAPSULE | ORAL | 5 refills | Status: DC

## 2020-08-02 NOTE — Telephone Encounter (Signed)
Patient is calling to get the name of the medication that was recommended for him to start not sure which he said it was a probiotic OTC

## 2020-08-02 NOTE — Telephone Encounter (Signed)
The pt has been advised that Align is the probiotic that was recommended by Anderson Malta.  The pt has been advised of the information and verbalized understanding.

## 2020-08-02 NOTE — Progress Notes (Signed)
[place]   Follow-up Note  Referring Provider: Nicoletta Dress, MD Primary Provider: Nicoletta Dress, MD Date of Office Visit: 08/02/2020  Subjective:   Brandon Gallagher (DOB: 1961/07/06) is a 59 y.o. 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  His last visit to this clinic was 19 April 2020.  He has really done well during his last visit and overall feels as though his breathing is doing well up until the past week.  Prior to that point in time he did not require systemic steroid or antibiotic for any type of airway issue and rarely used a short acting bronchodilator while he continues on a collection of anti-inflammatory agents for his airway.  Over the past week he has developed coughing spells.  These are coughing spells that sometimes occur with drinking and sometimes without drinking.  During his coughing spells he is attempting to clear out his throat.  He continues to use therapy for reflux but he has tapered down some of his medications for this issue recently.  He has not had any shortness of breath or wheezing or chest pain or other respiratory tract symptoms with this issue.  He has undergone a colectomy to address his chronic abdominal pain associated with diverticulitis and is about 2 months out from that surgery.  Allergies as of 08/02/2020   No Known Allergies     Medication List    acetaminophen 500 MG tablet Commonly known as: TYLENOL Take 500 mg by mouth every 6 (six) hours as needed.   albuterol 108 (90 Base) MCG/ACT inhaler Commonly known as: VENTOLIN HFA Can inhale two puffs every four to six hours as needed for cough or wheeze.   ALPRAZolam 1 MG tablet Commonly known as: XANAX Take 1 mg by mouth 3 (three) times  daily as needed for anxiety.   famotidine 40 MG tablet Commonly known as: PEPCID TAKE 1/2 TABLET BY MOUTH TWICE DAILY   fluticasone 110 MCG/ACT inhaler Commonly known as: FLOVENT HFA Inhale 2 puffs into the lungs 2 (two) times daily as needed (for flare-ups. Rinse, gargle, and spit after use.).   meclizine 25 MG tablet Commonly known as: ANTIVERT Take 25 mg by mouth 3 (three) times daily as needed.   montelukast 10 MG tablet Commonly known as: SINGULAIR TAKE (1) TABLET BY MOUTH AT BEDTIME   Nasacort Allergy 24HR 55 MCG/ACT Aero nasal inhaler Generic drug: triamcinolone Place 2 sprays into the nose daily.   omeprazole 20 MG capsule Commonly known as: PRILOSEC Take 20 mg by mouth in the morning.   Symbicort 160-4.5 MCG/ACT inhaler Generic drug: budesonide-formoterol Inhale 2 puffs into the lungs 1-2 times daily   venlafaxine XR 75 MG 24 hr capsule Commonly known as: EFFEXOR-XR Take 75 mg by mouth daily.   zolpidem 10 MG tablet Commonly known as: AMBIEN Take 10 mg by mouth at bedtime as needed.       Past Medical History:  Diagnosis Date  . Acute respiratory disease due to COVID-19 virus 10/30/2019  . Acute respiratory failure with hypoxia ([place]) 10/30/2019  . Allergy   . Anxiety   . Arthritis   . Asthma   . Blood transfusion without reported diagnosis   . Bronchitis    last had back  in 2017   Sees no pulmonary md  . Cervical spondylosis with radiculopathy 01/01/2017  . Chronic bilateral low back pain without sciatica 01/10/2016  . COPD (chronic obstructive pulmonary disease) ([place])   . Depression   . Diverticulitis of sigmoid colon 02/05/2020  . Diverticulosis    last flare up was in Jan. 2018  Was on course of antibiotics for this  . Esophageal stricture   . Esophagus disorder    narrowing of esophagus.  Had to have dilatation 09/2016  . Fall 01/10/2016  . Family history of adverse reaction to anesthesia    son had severe N/V with his knee surgery  . Gastric  polyp   . GERD (gastroesophageal reflux disease)   . Headache   . History of Clostridioides difficile colitis   . History of colon polyps   . IBS (irritable bowel syndrome)   . 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 Morbid obesity due to excess calories ([place]) 07/28/2015  . OSA (obstructive sleep apnea) 10/06/2015  . Pain in joint, ankle and foot 11/22/2018   Right:  . Presbyesophagus   . S/P lumbar laminectomy 11/01/2015   Overview:  AXIALIF L5-S1 with percutaneous robot assisted pedicle scre and rod instrumentation on 08/20/15- Dr. Elnoria Howard   Overview:  S/P minimally invasive lumbar laminectomy L3-4 performed on 10/08/2015 by Dr. Elnoria Howard   . Sacroiliac joint pain 07/27/2016  . Sinus bradycardia by electrocardiogram 10/30/2019  . Sleep apnea    no CPAP- unsure if true diagnosis or not  . Thrombosed external hemorrhoid     Past Surgical History:  Procedure Laterality Date  . ANTERIOR CERVICAL DECOMP/DISCECTOMY FUSION N/A 01/01/2017   Procedure: Cervical four- five and Cervical six- seven Anterior cervical discectomy with fusion and plate fixation;  Surgeon: Kevan Ny Ditty, MD;  Location: [facility];  Service: Neurosurgery;  Laterality: N/A;  C4-5 and C6-7 Anterior cervical discectomy with fusion and plate fixation  . BACK SURGERY     x 2    @ [facility]        Last one 09/2015  . CARDIAC CATHETERIZATION     He thinks.   Not really sure where at, thought it was here @ [facility], but its been a LONG time ?2000  . CARPAL TUNNEL RELEASE Bilateral   . COLECTOMY    . COLONOSCOPY  10/22/2017   Colonic polyp status post polypectomy. Moderate predominantly sigmoid diverticulosis.   . COLONOSCOPY  2019   Dr Orlena Sheldon  . ESOPHAGOGASTRODUODENOSCOPY  06/08/2016   Distal esophageal stricture status post esophageal dilatation. Presbtesophagus, also suggestive of motility disorder. Mild gastritis.   Marland Kitchen evacuation thrombosed external hemorrhoid  2021  . KNEE SURGERY Right   . LUMBAR LAMINECTOMY  10/08/2015   L3-4,  Surgeon Deirdre Evener, MD  . NASAL SINUS SURGERY     HAS HAD 2 OR 3.   LAST ONE 2011, IN [place]  (LEFT NASAL PASSAGE IS NARROWER THAN RIGHT  . SACROILIAC JOINT INJECTION Bilateral 01/10/2016  . UPPER GASTROINTESTINAL ENDOSCOPY      Review of systems negative except as noted in HPI / PMHx or noted below:  Review of Systems  Constitutional: Negative.   HENT: Negative.   Eyes: Negative.   Respiratory: Negative.   Cardiovascular: Negative.   Gastrointestinal: Negative.   Genitourinary: Negative.   Musculoskeletal: Negative.   Skin: Negative.   Neurological: Negative.   Endo/Heme/Allergies: Negative.   Psychiatric/Behavioral: Negative.      Objective:   Vitals:   08/02/20 1356  BP: (!) 92/56  Pulse: 72  Resp: 18  SpO2: 94%          Physical Exam Constitutional:      Appearance: He is not diaphoretic.  HENT:     Head: Normocephalic.     Right Ear: Tympanic membrane, ear canal and external ear normal.     Left Ear: Tympanic membrane, ear canal and external ear normal.     Nose: Nose normal. No mucosal edema or rhinorrhea.     Mouth/Throat:     Pharynx: Uvula midline. No oropharyngeal exudate.  Eyes:     Conjunctiva/sclera: Conjunctivae normal.  Neck:     Thyroid: No thyromegaly.     Trachea: Trachea normal. No tracheal tenderness or tracheal deviation.  Cardiovascular:     Rate and Rhythm: Normal rate and regular rhythm.     Heart sounds: Normal heart sounds, S1 normal and S2 normal. No murmur heard.   Pulmonary:     Effort: No respiratory distress.     Breath sounds: Normal breath sounds. No stridor. No wheezing or rales.  Lymphadenopathy:     Head:     Right side of head: No tonsillar adenopathy.     Left side of head: No tonsillar adenopathy.     Cervical: No cervical adenopathy.  Skin:    Findings: No erythema or rash.     Nails: There is no clubbing.  Neurological:     Mental Status: He is alert.     Diagnostics:    Spirometry was  performed and demonstrated an FEV1 of 3.40 at 91 % of predicted.  Assessment and Plan:   1. Asthma, moderate persistent, well-controlled   2. Perennial allergic rhinitis   3. Gastroesophageal reflux disease, unspecified whether esophagitis present   4. IgA deficiency ([place])     1.  Continue to treat and prevent inflammation:   A.  OTC Nasacort-1 spray each nostril 1 time per day  B.  Symbicort 160- 2 inhalations 1-2 times per day     C.  ATTEMPT TO DISCONTINUE MONTELUKAST / SINGULAIR  2.  If needed:   A.  Nasal saline  B.  Albuterol HFA-2 inhalations every 4-6 hours  3.  Continue to treat reflux with the following:   A.  Omeprazole 40 mg - 1 tablet 1-2 times per day   B.  Famotidine 40 mg - 1 tablet 1-2 times per day  4.  Add Flovent 110 - 2 inhalations 2 times per day to Symbicort during increased asthma activity  5.  Return to clinic in 12 weeks or earlier if problem  6. Obtain fall flu vaccine and Covid booster.   7. Modified barium swallow?  Overall Brandon Gallagher sounds as though he is doing relatively well.  There is an expense issue with using his medications and will attempt to discontinue his montelukast while he continues on a collection of anti-inflammatory agents for both his upper and lower airway.  The symptoms that have occurred over the course of the past week sound as though they are more laryngeal in nature than they are his lung.  I will assume that his reflux is may be a little more active and will have him utilize omeprazole and famotidine at high dose as noted above during this timeframe and if his symptoms do not improve in the face of this treatment then I think he needs to obtain a modified barium swallow to look at the mechanics of the swallowing mechanism.  If he does  well I will see him back in this clinic in 12 weeks or earlier if there is a problem.  Allena Katz, MD Allergy / Immunology [place]

## 2020-08-02 NOTE — Patient Instructions (Addendum)
  1.  Continue to treat and prevent inflammation:   A.  OTC Nasacort-1 spray each nostril 1 time per day  B.  Symbicort 160- 2 inhalations 1-2 times per day     C.  ATTEMPT TO DISCONTINUE MONTELUKAST / SINGULAIR  2.  If needed:   A.  Nasal saline  B.  Albuterol HFA-2 inhalations every 4-6 hours  3.  Continue to treat reflux with the following:   A.  Omeprazole 40 mg - 1 tablet 1-2 times per day   B.  Famotidine 40 mg - 1 tablet 1-2 times per day  4.  Add Flovent 110 - 2 inhalations 2 times per day to Symbicort during increased asthma activity  5.  Return to clinic in 12 weeks or earlier if problem  6. Obtain fall flu vaccine and Covid booster.   7. Modified barium swallow?

## 2020-08-03 ENCOUNTER — Encounter: Payer: Self-pay | Admitting: Allergy and Immunology

## 2020-08-05 ENCOUNTER — Telehealth: Payer: Self-pay | Admitting: Gastroenterology

## 2020-08-05 NOTE — Telephone Encounter (Signed)
Spoke to patient who recently had surgery for diverticulitis. He was inquiring about what foods he should eat and what foods to avoid. Patient was advised to contact the surgeons office who did the surgery for that recommendation. Patient voiced understanding and will contact that office.

## 2020-08-05 NOTE — Telephone Encounter (Signed)
Pt is requesting a call back from a nurse to advise what diet should he follow following the procedure he had done (partial diverticulitis)

## 2020-08-09 ENCOUNTER — Other Ambulatory Visit: Payer: Self-pay

## 2020-08-09 ENCOUNTER — Telehealth: Payer: Self-pay | Admitting: Allergy and Immunology

## 2020-08-09 MED ORDER — TRIAMCINOLONE ACETONIDE 55 MCG/ACT NA AERO: INHALATION_SPRAY | NASAL | 5 refills | Status: DC

## 2020-08-09 NOTE — Telephone Encounter (Signed)
Refill sent in

## 2020-08-09 NOTE — Telephone Encounter (Signed)
Nasacort to [facility].

## 2020-08-16 ENCOUNTER — Telehealth: Payer: Self-pay | Admitting: Allergy and Immunology

## 2020-08-16 DIAGNOSIS — R350 Frequency of micturition: Secondary | ICD-10-CM | POA: Diagnosis not present

## 2020-08-16 DIAGNOSIS — R42 Dizziness and giddiness: Secondary | ICD-10-CM | POA: Diagnosis not present

## 2020-08-16 DIAGNOSIS — R1314 Dysphagia, pharyngoesophageal phase: Secondary | ICD-10-CM | POA: Diagnosis not present

## 2020-08-16 NOTE — Telephone Encounter (Signed)
Brandon Gallagher called in and states he is still having a dry cough.  Brandon Gallagher states now he is having a burning sensation in his chest but it went away after he drank water.  He sees Dr. Delena Bali today and was wondering if Dr. Delena Bali can order the barium swallow, or does Dr. Neldon Mc have to do it since he was the one that saw him?  Brandon Gallagher also states that Dr. Delena Bali wants to refer him to ENT.  Please advise.

## 2020-08-16 NOTE — Telephone Encounter (Signed)
Please inform Brandon Gallagher that Dr. Elissa Hefty can order the study and Dr. Olean Ree can refer him to ENT.  He probably needs to have both of these performed if he continues to cough.

## 2020-08-16 NOTE — Telephone Encounter (Signed)
Informed Brandon Gallagher of this.

## 2020-08-23 DIAGNOSIS — G43909 Migraine, unspecified, not intractable, without status migrainosus: Secondary | ICD-10-CM | POA: Diagnosis not present

## 2020-08-23 DIAGNOSIS — B370 Candidal stomatitis: Secondary | ICD-10-CM | POA: Diagnosis not present

## 2020-08-26 DIAGNOSIS — R1314 Dysphagia, pharyngoesophageal phase: Secondary | ICD-10-CM | POA: Diagnosis not present

## 2020-08-27 ENCOUNTER — Ambulatory Visit: Payer: Medicare Other | Admitting: Gastroenterology

## 2020-08-27 ENCOUNTER — Encounter: Payer: Self-pay | Admitting: Gastroenterology

## 2020-08-27 VITALS — BP 106/64 | HR 81 | Ht 71.0 in | Wt 258.1 lb

## 2020-08-27 DIAGNOSIS — K5732 Diverticulitis of large intestine without perforation or abscess without bleeding: Secondary | ICD-10-CM

## 2020-08-27 DIAGNOSIS — R1310 Dysphagia, unspecified: Secondary | ICD-10-CM

## 2020-08-27 NOTE — Progress Notes (Signed)
Chief Complaint: FU  Referring Provider:  Nicoletta Dress, MD      ASSESSMENT AND PLAN;   #1.  Chronic LLQ pain d/t painful diverticular disease s/p sig resection July 2021 with near complete resolution.  Neg colon 02/2018 (Dr Melina Copa), neg CT AP 10/2019. FS 06/2019 small colon polyps (TAs) s/p polypectomy, mod sig div, small int hemorrhoids. Random colonic Bx did show lymphocytic colitis.  Diarrhea completely resolved.  #2.  IBS-C  #3.  GERD with dysphagia d/t distal eso stricture/presbyesophagus s/p dil 06/2019 with resolution of dysphagia. H/O food impaction S/P endoscopic disimpaction 04/2016. Eso Bx- neg for EoE. SB Bx neg for celiac. Neg recent MBS   #5. H/O colonic polyps on colon (Dr Melina Copa) 02/2018. Next colon due 02/2021.  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 Covid 19 pneumonia (adm to [facility]) 12/17 to 11/05/2019.  Plan: -Rpt colon 02/2021. Recommend EGD with dil at same time. -Increase water intake. -High Fiber Diet. -Continue omeprazole for now    HPI:    Brandon Gallagher is a 59 y.o. male  FU  Doing much better.  Only occasional left lower quadrant abdominal pain.  Much better after sigmoid resection.  Continues to take fiber supplements every day.  Denies having any significant constipation or straining.  Had occasional problems with dysphagia.  Underwent modified barium swallow recently at [facility].  He has a handwritten report.  It was essentially negative.  He was advised to wear his dentures every time he eats.  He was able to clear food with subsequent swallows.  He does not feel that he needs dilatation at this time.  Has been taking align every day.  He also has been trying to lose weight and was able to lose weight previously.  He has gained weight back.  Wt Readings from Last 3 Encounters:  08/27/20 258 lb 2 oz (117.1 kg)  07/02/20 244 lb (110.7 kg)  03/11/20 250 lb (113.4 kg)     SH-wife Becky works with Dr.  Delena Bali  Past GI procedures: -Colonoscopy 10/22/2017 ([facility])-colonic polyps (17) status post polypectomy (Bx- TA), moderate predominantly sigmoid diverticulosis.  Random colonic biopsies-mild lymphocytic colitis.  Refused genetic test. Colonoscopy 2019 (Dr. Melina Copa)- 4 small polyps measuring 4 to 6 mm s/p  Polypectomy. Bx- TAs.  Repeat in 3 years. -EGD 06/2019-distal esophageal stricture S/P dilatation, negative small bowel biopsies, gastric polyps-benign.  06/08/2016-distal esophageal stricture s/p esophageal dilatation (54Fr), presbyesophagus, mild gastritis. Bx- neg for EoE, neg SB Bx. H/O food impaction s/p endoscopic disimpaction 04/15/2016 -CT AP with contrast 05/14/2019: Diverticulosis without diverticulitis, mild hepatic steatosis.  Otherwise normal. Past Medical History:  Diagnosis Date  . Acute respiratory disease due to COVID-19 virus 10/30/2019  . Acute respiratory failure with hypoxia ([place]) 10/30/2019  . Allergy   . Anxiety   . Arthritis   . Asthma   . Blood transfusion without reported diagnosis   . Bronchitis    last had back in 2017   Sees no pulmonary md  . Cervical spondylosis with radiculopathy 01/01/2017  . Chronic bilateral low back pain without sciatica 01/10/2016  . COPD (chronic obstructive pulmonary disease) ([place])   . Depression   . Diverticulitis of sigmoid colon 02/05/2020  . Diverticulosis    last flare up was in Jan. 2018  Was on course of antibiotics for this  . Esophageal stricture   . Esophagus disorder    narrowing of esophagus.  Had to have dilatation 09/2016  .  Fall 01/10/2016  . Family history of adverse reaction to anesthesia    son had severe N/V with his knee surgery  . Gastric polyp   . GERD (gastroesophageal reflux disease)   . Headache   . History of Clostridioides difficile colitis   . History of colon polyps   . IBS (irritable bowel syndrome)   . 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 Morbid obesity due to excess calories ([place]) 07/28/2015  . OSA  (obstructive sleep apnea) 10/06/2015  . Pain in joint, ankle and foot 11/22/2018   Right:  . Presbyesophagus   . S/P lumbar laminectomy 11/01/2015   Overview:  AXIALIF L5-S1 with percutaneous robot assisted pedicle scre and rod instrumentation on 08/20/15- Dr. Elnoria Howard   Overview:  S/P minimally invasive lumbar laminectomy L3-4 performed on 10/08/2015 by Dr. Elnoria Howard   . Sacroiliac joint pain 07/27/2016  . Sinus bradycardia by electrocardiogram 10/30/2019  . Sleep apnea    no CPAP- unsure if true diagnosis or not  . Thrombosed external hemorrhoid     Past Surgical History:  Procedure Laterality Date  . ANTERIOR CERVICAL DECOMP/DISCECTOMY FUSION N/A 01/01/2017   Procedure: Cervical four- five and Cervical six- seven Anterior cervical discectomy with fusion and plate fixation;  Surgeon: Kevan Ny Ditty, MD;  Location: [facility];  Service: Neurosurgery;  Laterality: N/A;  C4-5 and C6-7 Anterior cervical discectomy with fusion and plate fixation  . BACK SURGERY     x 2    @ [facility]        Last one 09/2015  . CARDIAC CATHETERIZATION     He thinks.   Not really sure where at, thought it was here @ [facility], but its been a LONG time ?2000  . CARPAL TUNNEL RELEASE Bilateral   . COLECTOMY    . COLONOSCOPY  10/22/2017   Colonic polyp status post polypectomy. Moderate predominantly sigmoid diverticulosis.   . COLONOSCOPY  2019   Dr Orlena Sheldon  . ESOPHAGOGASTRODUODENOSCOPY  06/08/2016   Distal esophageal stricture status post esophageal dilatation. Presbtesophagus, also suggestive of motility disorder. Mild gastritis.   Marland Kitchen evacuation thrombosed external hemorrhoid  2021  . KNEE SURGERY Right   . LUMBAR LAMINECTOMY  10/08/2015   L3-4, Surgeon Deirdre Evener, MD  . NASAL SINUS SURGERY     HAS HAD 2 OR 3.   LAST ONE 2011, IN [place]  (LEFT NASAL PASSAGE IS NARROWER THAN RIGHT  . SACROILIAC JOINT INJECTION Bilateral 01/10/2016  . UPPER GASTROINTESTINAL ENDOSCOPY      Family History  Problem Relation  Age of Onset  . Heart disease Mother   . Stroke Father   . Colon cancer Neg Hx   . Esophageal cancer Neg Hx   . Stomach cancer Neg Hx   . Diabetes Neg Hx   . Rectal cancer Neg Hx     Social History   Tobacco Use  . Smoking status: Former Smoker    Packs/day: 1.00    Years: 30.00    Pack years: 30.00    Types: Cigarettes    Quit date: 05/12/2000    Years since quitting: 20.3  . Smokeless tobacco: Never Used  Vaping Use  . Vaping Use: Never used  Substance Use Topics  . Alcohol use: No    Alcohol/week: 0.0 standard drinks  . Drug use: Not Currently    Current Outpatient Medications  Medication Sig Dispense Refill  . acetaminophen (TYLENOL) 500 MG tablet Take 500 mg by mouth every 6 (six) hours as needed.    Marland Kitchen  albuterol (VENTOLIN HFA) 108 (90 Base) MCG/ACT inhaler Can inhale two puffs every four to six hours as needed for cough or wheeze. 18 g 1  . ALPRAZolam (XANAX) 1 MG tablet Take 1 mg by mouth 3 (three) times daily as needed for anxiety.     . famotidine (PEPCID) 40 MG tablet Take one tablet by mouth one to two times daily as directed. 60 tablet 5  . fluticasone (FLOVENT HFA) 110 MCG/ACT inhaler Inhale 2 puffs into the lungs 2 (two) times daily as needed (for flare-ups. Rinse, gargle, and spit after use.).    Marland Kitchen omeprazole (PRILOSEC) 40 MG capsule Take one capsule by mouth one to two times daily as directed. (Patient taking differently: Take 40 mg by mouth 2 (two) times daily. ) 60 capsule 5  . SUMAtriptan (IMITREX) 100 MG tablet Take 100 mg by mouth as needed.    . SYMBICORT 160-4.5 MCG/ACT inhaler Inhale 2 puffs into the lungs 1-2 times daily 10.2 g 5  . triamcinolone (NASACORT) 55 MCG/ACT AERO nasal inhaler Use 1 spray in each nostril once daily 1 each 5  . venlafaxine XR (EFFEXOR-XR) 75 MG 24 hr capsule Take 75 mg by mouth daily.    . meclizine (ANTIVERT) 25 MG tablet Take 25 mg by mouth 3 (three) times daily as needed. (Patient not taking: Reported on 08/27/2020)     No  current facility-administered medications for this visit.    No Known Allergies  Review of Systems:  Has fatigue Recovering from recent COVID-19.     Physical Exam:    BP 106/64   Pulse 81   Ht 5\' 11"  (1.803 m)   Wt 258 lb 2 oz (117.1 kg)   BMI 36.00 kg/m  Filed Weights   08/27/20 1529  Weight: 258 lb 2 oz (117.1 kg)  Gen: awake, alert, NAD HEENT: anicteric, no pallor CV: RRR, no mrg Pulm: CTA b/l Abd: soft, NT/ND, +BS throughout Ext: no c/c/e Neuro: nonfocal  Data Reviewed: I have personally reviewed following labs and imaging studies  CBC: CBC Latest Ref Rng & Units 11/05/2019 11/04/2019 11/03/2019  WBC 4.0 - 10.5 K/uL 10.9(H) 10.5 8.1  Hemoglobin 13.0 - 17.0 g/dL 15.8 16.6 14.7  Hematocrit 39 - 52 % 48.1 50.5 44.2  Platelets 150 - 400 K/uL 354 372 310    CMP: CMP Latest Ref Rng & Units 11/05/2019 11/04/2019 11/03/2019  Glucose 70 - 99 mg/dL 107(H) 75 68(L)  BUN 6 - 20 mg/dL 17 14 15   Creatinine 0.61 - 1.24 mg/dL 1.00 1.06 0.91  Sodium 135 - 145 mmol/L 136 137 137  Potassium 3.5 - 5.1 mmol/L 5.0 3.7 4.0  Chloride 98 - 111 mmol/L 101 100 101  CO2 22 - 32 mmol/L 25 23 26   Calcium 8.9 - 10.3 mg/dL 9.2 9.2 8.7(L)  Total Protein 6.5 - 8.1 g/dL 6.3(L) 6.7 5.7(L)  Total Bilirubin 0.3 - 1.2 mg/dL 0.7 0.8 0.8  Alkaline Phos 38 - 126 U/L 59 59 55  AST 15 - 41 U/L 22 27 27   ALT 0 - 44 U/L 29 29 30  25  minutes spent with the patient today. Greater than 50% was spent in counseling and coordination of care with the patient     Carmell Austria, MD 08/27/2020, 3:42 PM  Cc: Nicoletta Dress, MD

## 2020-08-27 NOTE — Patient Instructions (Signed)
Increase your water intake  High fiber Diet

## 2020-09-04 DIAGNOSIS — G4700 Insomnia, unspecified: Secondary | ICD-10-CM | POA: Diagnosis not present

## 2020-09-04 DIAGNOSIS — R7301 Impaired fasting glucose: Secondary | ICD-10-CM | POA: Diagnosis not present

## 2020-09-04 DIAGNOSIS — Z23 Encounter for immunization: Secondary | ICD-10-CM | POA: Diagnosis not present

## 2020-09-04 DIAGNOSIS — E785 Hyperlipidemia, unspecified: Secondary | ICD-10-CM | POA: Diagnosis not present

## 2020-09-09 DIAGNOSIS — R059 Cough, unspecified: Secondary | ICD-10-CM | POA: Diagnosis not present

## 2020-09-09 DIAGNOSIS — U071 COVID-19: Secondary | ICD-10-CM | POA: Diagnosis not present

## 2020-09-09 DIAGNOSIS — J9811 Atelectasis: Secondary | ICD-10-CM | POA: Diagnosis not present

## 2020-09-22 DIAGNOSIS — B9689 Other specified bacterial agents as the cause of diseases classified elsewhere: Secondary | ICD-10-CM | POA: Diagnosis not present

## 2020-09-22 DIAGNOSIS — J208 Acute bronchitis due to other specified organisms: Secondary | ICD-10-CM | POA: Diagnosis not present

## 2020-09-30 DIAGNOSIS — N3281 Overactive bladder: Secondary | ICD-10-CM | POA: Diagnosis not present

## 2020-09-30 DIAGNOSIS — R3915 Urgency of urination: Secondary | ICD-10-CM | POA: Diagnosis not present

## 2020-10-05 DIAGNOSIS — Z0000 Encounter for general adult medical examination without abnormal findings: Secondary | ICD-10-CM | POA: Diagnosis not present

## 2020-10-05 DIAGNOSIS — Z9181 History of falling: Secondary | ICD-10-CM | POA: Diagnosis not present

## 2020-10-05 DIAGNOSIS — E785 Hyperlipidemia, unspecified: Secondary | ICD-10-CM | POA: Diagnosis not present

## 2020-10-25 ENCOUNTER — Encounter: Payer: Self-pay | Admitting: Allergy and Immunology

## 2020-10-25 ENCOUNTER — Other Ambulatory Visit: Payer: Self-pay

## 2020-10-25 ENCOUNTER — Ambulatory Visit (INDEPENDENT_AMBULATORY_CARE_PROVIDER_SITE_OTHER): Payer: Medicare Other | Admitting: Allergy and Immunology

## 2020-10-25 VITALS — BP 122/70 | HR 88 | Resp 18

## 2020-10-25 DIAGNOSIS — J3089 Other allergic rhinitis: Secondary | ICD-10-CM | POA: Diagnosis not present

## 2020-10-25 DIAGNOSIS — D802 Selective deficiency of immunoglobulin A [IgA]: Secondary | ICD-10-CM

## 2020-10-25 DIAGNOSIS — J4540 Moderate persistent asthma, uncomplicated: Secondary | ICD-10-CM

## 2020-10-25 DIAGNOSIS — K219 Gastro-esophageal reflux disease without esophagitis: Secondary | ICD-10-CM | POA: Diagnosis not present

## 2020-10-25 MED ORDER — METOCLOPRAMIDE HCL 5 MG PO TABS: ORAL_TABLET | ORAL | 5 refills | Status: DC

## 2020-10-25 NOTE — Progress Notes (Signed)
[place]   Follow-up Note  Referring Provider: Nicoletta Dress, MD Primary Provider: Nicoletta Dress, MD Date of Office Visit: 10/25/2020  Subjective:   Brandon Gallagher (DOB: 1961/02/11) is a 59 y.o. 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  His last visit to this clinic was 02 August 2020.  Overall his asthma has been under pretty good control and he has not really had much issues with his upper airways.  His requirement for short acting bronchodilator is less than 1 time per week.    When he was last seen in his clinic he was having coughing spells.  He subsequently was diagnosed with "bronchitis" 1 month ago with unrelenting cough by urgent care.  He was given 2 rounds of Levaquin and prednisone which has not really helped him very much.  It should be noted that most of his coughing spells are associated with an attempt to clear out his throat and the feeling as though he is choking.  He did have a modified barium swallow or a barium swallow (I cannot really tell at this point as I did not order the study).  This identified esophageal dysmotility according to Brandon Gallagher's description.  When he was given crackers and applesauce he had a delay in pushing this food forward through his esophagus.  It did require him to take another bolus of food to clear this.  This is consistent with his history of developing 2-3 times per week food stuck in his chest.  These episodes hurts when this occurs and sometimes it can last for several minutes.  He usually tries to drink several sips of water multiple times to get this to clear.  It should be noted that he had his esophagus dilated by Dr. Lyndel Gallagher, GI, about 6 months ago.  He really has bad regurgitation at  nighttime and has chest pain at night.  He attempts to eat a very light meal at night and a big meal of the day is lunchtime.  Allergies as of 10/25/2020   No Known Allergies     Medication List    acetaminophen 500 MG tablet Commonly known as: TYLENOL Take 500 mg by mouth every 6 (six) hours as needed.   albuterol 108 (90 Base) MCG/ACT inhaler Commonly known as: VENTOLIN HFA Can inhale two puffs every four to six hours as needed for cough or wheeze.   ALPRAZolam 1 MG tablet Commonly known as: XANAX Take 1 mg by mouth 3 (three) times daily as needed for anxiety.   famotidine 40 MG tablet Commonly known as: PEPCID Take one tablet by mouth one to two times daily as directed.   fluticasone 110 MCG/ACT inhaler Commonly known as: FLOVENT HFA Inhale 2 puffs into the lungs 2 (two) times daily as needed (for flare-ups. Rinse, gargle, and spit after use.).   omeprazole 40 MG capsule Commonly known as: PRILOSEC Take one capsule by mouth one to two times daily as directed.   Symbicort 160-4.5 MCG/ACT inhaler Generic drug: budesonide-formoterol Inhale 2 puffs into the lungs 1-2 times daily   triamcinolone 55 MCG/ACT Aero nasal inhaler Commonly known as: NASACORT Use 1 spray in each nostril once daily   venlafaxine XR 75 MG 24 hr capsule Commonly known as:  EFFEXOR-XR Take 75 mg by mouth daily.       Past Medical History:  Diagnosis Date  . Acute respiratory disease due to COVID-19 virus 10/30/2019  . Acute respiratory failure with hypoxia ([place]) 10/30/2019  . Allergy   . Anxiety   . Arthritis   . Asthma   . Blood transfusion without reported diagnosis   . Bronchitis    last had back in 2017   Sees no pulmonary md  . Cervical spondylosis with radiculopathy 01/01/2017  . Chronic bilateral low back pain without sciatica 01/10/2016  . COPD (chronic obstructive pulmonary disease) ([place])   . Depression   . Diverticulitis of sigmoid colon 02/05/2020  . Diverticulosis    last  flare up was in Jan. 2018  Was on course of antibiotics for this  . Esophageal stricture   . Esophagus disorder    narrowing of esophagus.  Had to have dilatation 09/2016  . Fall 01/10/2016  . Family history of adverse reaction to anesthesia    son had severe N/V with his knee surgery  . Gastric polyp   . GERD (gastroesophageal reflux disease)   . Headache   . History of Clostridioides difficile colitis   . History of colon polyps   . IBS (irritable bowel syndrome)   . 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 Morbid obesity due to excess calories ([place]) 07/28/2015  . OSA (obstructive sleep apnea) 10/06/2015  . Pain in joint, ankle and foot 11/22/2018   Right:  . Presbyesophagus   . S/P lumbar laminectomy 11/01/2015   Overview:  AXIALIF L5-S1 with percutaneous robot assisted pedicle scre and rod instrumentation on 08/20/15- Dr. Elnoria Gallagher   Overview:  S/P minimally invasive lumbar laminectomy L3-4 performed on 10/08/2015 by Dr. Elnoria Gallagher   . Sacroiliac joint pain 07/27/2016  . Sinus bradycardia by electrocardiogram 10/30/2019  . Sleep apnea    no CPAP- unsure if true diagnosis or not  . Thrombosed external hemorrhoid     Past Surgical History:  Procedure Laterality Date  . ANTERIOR CERVICAL DECOMP/DISCECTOMY FUSION N/A 01/01/2017   Procedure: Cervical four- five and Cervical six- seven Anterior cervical discectomy with fusion and plate fixation;  Surgeon: Brandon Ny Ditty, MD;  Location: [facility];  Service: Neurosurgery;  Laterality: N/A;  C4-5 and C6-7 Anterior cervical discectomy with fusion and plate fixation  . BACK SURGERY     x 2    @ [facility]        Last one 09/2015  . CARDIAC CATHETERIZATION     He thinks.   Not really sure where at, thought it was here @ [facility], but its been a LONG time ?2000  . CARPAL TUNNEL RELEASE Bilateral   . COLECTOMY    . COLONOSCOPY  10/22/2017   Colonic polyp status post polypectomy. Moderate predominantly sigmoid diverticulosis.   . COLONOSCOPY  2019   Dr Orlena Gallagher   . ESOPHAGOGASTRODUODENOSCOPY  06/08/2016   Distal esophageal stricture status post esophageal dilatation. Presbtesophagus, also suggestive of motility disorder. Mild gastritis.   Marland Kitchen evacuation thrombosed external hemorrhoid  2021  . KNEE SURGERY Right   . LUMBAR LAMINECTOMY  10/08/2015   L3-4, Surgeon Deirdre Evener, MD  . NASAL SINUS SURGERY     HAS HAD 2 OR 3.   LAST ONE 2011, IN [place]  (LEFT NASAL PASSAGE IS NARROWER THAN RIGHT  . SACROILIAC JOINT INJECTION Bilateral 01/10/2016  . UPPER GASTROINTESTINAL ENDOSCOPY      Review of systems negative except as noted in  HPI / PMHx or noted below:  Review of Systems  Constitutional: Negative.   HENT: Negative.   Eyes: Negative.   Respiratory: Negative.   Cardiovascular: Negative.   Gastrointestinal: Negative.   Genitourinary: Negative.   Musculoskeletal: Negative.   Skin: Negative.   Neurological: Negative.   Endo/Heme/Allergies: Negative.   Psychiatric/Behavioral: Negative.      Objective:   Vitals:   10/25/20 1348  BP: 122/70  Pulse: 88  Resp: 18  SpO2: 95%          Physical Exam Constitutional:      Appearance: He is not diaphoretic.  HENT:     Head: Normocephalic.     Right Ear: Tympanic membrane, ear canal and external ear normal.     Left Ear: Tympanic membrane, ear canal and external ear normal.     Nose: Nose normal. No mucosal edema or rhinorrhea.     Mouth/Throat:     Mouth: Oropharynx is clear and moist and mucous membranes are normal.     Pharynx: Uvula midline. No oropharyngeal exudate.  Eyes:     Conjunctiva/sclera: Conjunctivae normal.  Neck:     Thyroid: No thyromegaly.     Trachea: Trachea normal. No tracheal tenderness or tracheal deviation.  Cardiovascular:     Rate and Rhythm: Normal rate and regular rhythm.     Heart sounds: Normal heart sounds, S1 normal and S2 normal. No murmur heard.   Pulmonary:     Effort: No respiratory distress.     Breath sounds: Normal breath sounds.  No stridor. No wheezing or rales.  Musculoskeletal:        General: No edema.  Lymphadenopathy:     Head:     Right side of head: No tonsillar adenopathy.     Left side of head: No tonsillar adenopathy.     Cervical: No cervical adenopathy.  Skin:    Findings: No erythema or rash.     Nails: There is no clubbing.  Neurological:     Mental Status: He is alert.     Diagnostics:    Spirometry was performed and demonstrated an FEV1 of 3.24 at 87 % of predicted.  Assessment and Plan:   1. Asthma, moderate persistent, well-controlled   2. Perennial allergic rhinitis   3. Gastroesophageal reflux disease, unspecified whether esophagitis present   4. IgA deficiency ([place])     1.  Continue to treat and prevent inflammation:   A.  OTC Nasacort-1 spray each nostril 1 time per day  B.  Symbicort 160- 2 inhalations 1-2 times per day     2.  If needed:   A.  Nasal saline  B.  Albuterol HFA-2 inhalations every 4-6 hours  3.  Continue to treat reflux with the following:   A.  Omeprazole 40 mg - 1 tablet 2 times per day   B.  Famotidine 40 mg - 1 tablet 2 times per day  C.  Metoclopramide 5 mg -1 tablet at bedtime  4.  Add Flovent 110 - 2 inhalations 2 times per day to Symbicort during increased asthma activity  5.  Return to clinic in 12 weeks or earlier if problem  I think that the majority of [place] respiratory tract irritation is probably based on some degree of reflux as he certainly has LPR and he has esophageal dysmotility.  I am going to give him a low-dose of metoclopramide at nighttime in addition to his proton pump inhibitor and H2 receptor blocker that hopefully will  help him regarding some of his nocturnal reflux events and also some of his cough.  I did have a discussion with him today about some of the potential side effects of metoclopramide including tardive dyskinesia.  He will continue on anti-inflammatory agents for his airway as noted above.  We will see what happens  over the course of the next 12 weeks.  If he still continues to have significant cough then it may be worthwhile to image his lower airway once again especially given the fact that he did have a rather significant episode of Covid pneumonitis in the past.  Allena Katz, MD [facility] / [place]

## 2020-10-25 NOTE — Patient Instructions (Addendum)
  1.  Continue to treat and prevent inflammation:   A.  OTC Nasacort-1 spray each nostril 1 time per day  B.  Symbicort 160- 2 inhalations 1-2 times per day     2.  If needed:   A.  Nasal saline  B.  Albuterol HFA-2 inhalations every 4-6 hours  3.  Continue to treat reflux with the following:   A.  Omeprazole 40 mg - 1 tablet 2 times per day   B.  Famotidine 40 mg - 1 tablet 2 times per day  C.  Metoclopramide 5 mg -1 tablet at bedtime  4.  Add Flovent 110 - 2 inhalations 2 times per day to Symbicort during increased asthma activity  5.  Return to clinic in 12 weeks or earlier if problem

## 2020-10-26 ENCOUNTER — Encounter: Payer: Self-pay | Admitting: Allergy and Immunology

## 2020-11-09 ENCOUNTER — Telehealth: Payer: Self-pay | Admitting: Gastroenterology

## 2020-11-09 NOTE — Telephone Encounter (Signed)
Patient called states he is still having issues with his throat and I seeking advise

## 2020-11-10 NOTE — Telephone Encounter (Signed)
Spoke to patient who states that he is having some coughing while drinking clear liquids. He is tolerating solid food well at this time. Patient will have repeat EGD in April along with colonoscopy. Had recent modified barriun swallow and has been referred to ENT. Patient will contact ENT. All questions answered. Patient voiced understanding.

## 2020-11-10 NOTE — Telephone Encounter (Signed)
Inbound call from patient returning your call. 

## 2020-11-10 NOTE — Telephone Encounter (Signed)
LMOM for patient to call back.

## 2020-11-13 DIAGNOSIS — E119 Type 2 diabetes mellitus without complications: Secondary | ICD-10-CM

## 2020-11-13 HISTORY — DX: Type 2 diabetes mellitus without complications: E11.9

## 2020-11-19 DIAGNOSIS — J069 Acute upper respiratory infection, unspecified: Secondary | ICD-10-CM | POA: Diagnosis not present

## 2020-11-23 ENCOUNTER — Ambulatory Visit: Payer: Medicare Other | Admitting: Sports Medicine

## 2020-11-25 DIAGNOSIS — N401 Enlarged prostate with lower urinary tract symptoms: Secondary | ICD-10-CM | POA: Diagnosis not present

## 2020-11-25 DIAGNOSIS — R351 Nocturia: Secondary | ICD-10-CM | POA: Diagnosis not present

## 2020-11-25 DIAGNOSIS — N3281 Overactive bladder: Secondary | ICD-10-CM | POA: Diagnosis not present

## 2020-12-08 ENCOUNTER — Ambulatory Visit: Payer: Medicare HMO | Admitting: Sports Medicine

## 2020-12-08 ENCOUNTER — Other Ambulatory Visit: Payer: Self-pay

## 2020-12-08 ENCOUNTER — Ambulatory Visit: Payer: Medicare Other | Admitting: Sports Medicine

## 2020-12-08 ENCOUNTER — Encounter: Payer: Self-pay | Admitting: Sports Medicine

## 2020-12-08 DIAGNOSIS — M79672 Pain in left foot: Secondary | ICD-10-CM | POA: Diagnosis not present

## 2020-12-08 DIAGNOSIS — M2040 Other hammer toe(s) (acquired), unspecified foot: Secondary | ICD-10-CM | POA: Diagnosis not present

## 2020-12-08 DIAGNOSIS — M79671 Pain in right foot: Secondary | ICD-10-CM | POA: Diagnosis not present

## 2020-12-08 DIAGNOSIS — R2681 Unsteadiness on feet: Secondary | ICD-10-CM | POA: Diagnosis not present

## 2020-12-08 DIAGNOSIS — G629 Polyneuropathy, unspecified: Secondary | ICD-10-CM

## 2020-12-08 MED ORDER — GABAPENTIN 300 MG PO CAPS: 300.0000 mg | ORAL_CAPSULE | Freq: Every day | ORAL | 3 refills | Status: DC

## 2020-12-08 NOTE — Progress Notes (Signed)
Subjective: Brandon Gallagher is a 60 y.o. male patient who presents to office for evaluation of bilateral foot pain of numbness tingling burning worse at night.  Patient reports that he has seen his back doctor and the back is about the same with no worsening or new arthritis or bulging disc back doctor feels that neuropathy symptoms are coming from the feet up.  Patient states that he has lost the toe caps provided at last visit and still experiences some rubbing of toes in shoes.  Patient denies any other pedal complaints.  Patient Active Problem List   Diagnosis Date Noted  . Acute diffuse otitis externa of right ear 07/06/2020  . Thrombosed external hemorrhoid   . Sleep apnea   . Presbyesophagus   . IBS (irritable bowel syndrome)   . History of colon polyps   . History of Clostridioides difficile colitis   . Headache   . Gastric polyp   . Family history of adverse reaction to anesthesia   . Esophagus disorder   . Esophageal stricture   . Diverticulosis   . Diverticulitis   . Depression   . COPD (chronic obstructive pulmonary disease) ([place])   . Bronchitis   . Blood transfusion without reported diagnosis   . Arthritis   . Anxiety   . Allergy   . Diverticulitis of sigmoid colon 02/05/2020  . Postoperative examination 11/26/2019  . Acute respiratory disease due to COVID-19 virus 10/30/2019  . Acute respiratory failure with hypoxia ([place]) 10/30/2019  . 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 Acute hypoxemic respiratory failure due to COVID-19 ([place]) 10/30/2019  . Sinus bradycardia by electrocardiogram 10/30/2019  . Pain in joint, ankle and foot 11/22/2018  . Cervical spondylosis with radiculopathy 01/01/2017  . Sacroiliac joint pain 07/27/2016  . Chronic bilateral low back pain without sciatica 01/10/2016  . Fall 01/10/2016  . S/P lumbar laminectomy 11/01/2015  . Asthma 10/06/2015  . GERD (gastroesophageal reflux disease) 10/06/2015  . OSA (obstructive sleep apnea) 10/06/2015  .  Morbid obesity due to excess calories ([place]) 07/28/2015    Current Outpatient Medications on File Prior to Visit  Medication Sig Dispense Refill  . acetaminophen (TYLENOL) 500 MG tablet Take 500 mg by mouth every 6 (six) hours as needed.    Marland Kitchen albuterol (VENTOLIN HFA) 108 (90 Base) MCG/ACT inhaler Can inhale two puffs every four to six hours as needed for cough or wheeze. 18 g 1  . ALPRAZolam (XANAX) 1 MG tablet Take 1 mg by mouth 3 (three) times daily as needed for anxiety.     . famotidine (PEPCID) 40 MG tablet Take one tablet by mouth one to two times daily as directed. 60 tablet 5  . fluticasone (FLOVENT HFA) 110 MCG/ACT inhaler Inhale 2 puffs into the lungs 2 (two) times daily as needed (for flare-ups. Rinse, gargle, and spit after use.).    Marland Kitchen metoCLOPramide (REGLAN) 5 MG tablet Take 1 tablet by mouth at bedtime 30 tablet 5  . omeprazole (PRILOSEC) 20 MG capsule Take 20 mg by mouth 2 (two) times daily.    Marland Kitchen omeprazole (PRILOSEC) 40 MG capsule Take one capsule by mouth one to two times daily as directed. (Patient taking differently: Take 40 mg by mouth 2 (two) times daily.) 60 capsule 5  . solifenacin (VESICARE) 10 MG tablet Take 10 mg by mouth daily.    . SYMBICORT 160-4.5 MCG/ACT inhaler Inhale 2 puffs into the lungs 1-2 times daily 10.2 g 5  . triamcinolone (NASACORT) 55 MCG/ACT AERO  nasal inhaler Use 1 spray in each nostril once daily 1 each 5  . venlafaxine XR (EFFEXOR-XR) 75 MG 24 hr capsule Take 75 mg by mouth daily.    Marland Kitchen zolpidem (AMBIEN) 5 MG tablet Take 5 mg by mouth at bedtime as needed.     No current facility-administered medications on file prior to visit.    No Known Allergies  Objective:  General: Alert and oriented x3 in no acute distress  Dermatology: Blanchable erythema overlying 1-5 PIPJ dorsally bilateral.  No open lesions bilateral lower extremities, no signs of infection, no webspace macerations, no ecchymosis bilateral, all nails x 10 are short and  thickened.  Vascular: Dorsalis Pedis and Posterior Tibial pedal pulses 1/4, Capillary Fill Time 5 seconds,(+) scant pedal hair growth bilateral, trace edema bilateral lower extremities, varicosities bilateral temperature gradient within normal limits.  Neurology: Gross sensation intact via light touch bilateral, protective sensation severely diminished bilateral history of neuropathy.  Musculoskeletal: Rigid hammertoes 1 through 10 bilateral with most contracture noted at bilateral hallux.  There is minimal reproducible pain bilateral.  No pain to palpation to calf.  Equinus deformity noted bilateral.  Gait: Instability       Assessment and Plan: Problem List Items Addressed This Visit   None   Visit Diagnoses    Acquired hammertoe    -  Primary   Neuropathy       Gait instability       Foot pain, bilateral           -Complete examination performed -Discussed treatment options for hammertoes and neuropathy -Advised patient that hammertoes are result of severe neuropathy -Prescribed gabapentin for patient to take at bedtime to see if this will give some relief however advised patient that neuropathy can be progressive -Advised good supportive shoes for foot type to prevent rubbing -Continue with cane or walker for stability in gait -Advised patient to continue with physical therapy exercises -Patient to return to office if fails to improve or sooner if problems or issues arise.  Landis Martins, DPM

## 2020-12-16 DIAGNOSIS — J3489 Other specified disorders of nose and nasal sinuses: Secondary | ICD-10-CM | POA: Diagnosis not present

## 2020-12-16 DIAGNOSIS — J310 Chronic rhinitis: Secondary | ICD-10-CM | POA: Diagnosis not present

## 2020-12-16 DIAGNOSIS — J329 Chronic sinusitis, unspecified: Secondary | ICD-10-CM | POA: Diagnosis not present

## 2020-12-16 DIAGNOSIS — R1310 Dysphagia, unspecified: Secondary | ICD-10-CM | POA: Diagnosis not present

## 2020-12-16 DIAGNOSIS — Z9889 Other specified postprocedural states: Secondary | ICD-10-CM | POA: Diagnosis not present

## 2020-12-16 DIAGNOSIS — J342 Deviated nasal septum: Secondary | ICD-10-CM | POA: Diagnosis not present

## 2020-12-20 ENCOUNTER — Telehealth: Payer: Self-pay | Admitting: Gastroenterology

## 2020-12-20 NOTE — Telephone Encounter (Signed)
Pt is requesting a call back from a nurse to discuss his symptoms of acid reflux and difficulty swallowing.

## 2020-12-21 NOTE — Telephone Encounter (Signed)
Patient requesting an appointment with Dr Lyndel Safe to discus previous imaging and schedule EGD colonoscopy

## 2020-12-21 NOTE — Telephone Encounter (Signed)
Patient called to follow up on previous message is not feeling well

## 2020-12-27 ENCOUNTER — Telehealth: Payer: Self-pay | Admitting: *Deleted

## 2020-12-27 DIAGNOSIS — M5459 Other low back pain: Secondary | ICD-10-CM | POA: Diagnosis not present

## 2020-12-27 NOTE — Telephone Encounter (Signed)
-----   Message from Brandon Gallagher, [place] sent at 12/27/2020  1:04 PM EST ----- Not sure but it will be best if he can discuss this with his PCP to see if there are any other meds that can help his nerves with less side effects -Dr. Chauncey Cruel ----- Message ----- From: Viviana Simpler, [facility] Sent: 12/27/2020  12:41 PM EST To: Brandon Gallagher, DPM  Patient called and stated that the gabapentin is not helping and that it is making the patient gain weight and is there anything else the patient can try. Lattie Haw

## 2020-12-27 NOTE — Telephone Encounter (Signed)
Called and left a message for the patient and relayed the message per Dr Stover. Vanesa Renier ?

## 2020-12-29 DIAGNOSIS — M4727 Other spondylosis with radiculopathy, lumbosacral region: Secondary | ICD-10-CM | POA: Diagnosis not present

## 2021-01-03 ENCOUNTER — Ambulatory Visit: Payer: Medicare Other | Admitting: Gastroenterology

## 2021-01-07 DIAGNOSIS — R2242 Localized swelling, mass and lump, left lower limb: Secondary | ICD-10-CM | POA: Diagnosis not present

## 2021-01-07 DIAGNOSIS — M4316 Spondylolisthesis, lumbar region: Secondary | ICD-10-CM | POA: Diagnosis not present

## 2021-01-07 DIAGNOSIS — Z9889 Other specified postprocedural states: Secondary | ICD-10-CM | POA: Diagnosis not present

## 2021-01-07 DIAGNOSIS — M25559 Pain in unspecified hip: Secondary | ICD-10-CM | POA: Diagnosis not present

## 2021-01-07 DIAGNOSIS — M1612 Unilateral primary osteoarthritis, left hip: Secondary | ICD-10-CM | POA: Diagnosis not present

## 2021-01-07 DIAGNOSIS — M545 Low back pain, unspecified: Secondary | ICD-10-CM | POA: Diagnosis not present

## 2021-01-13 DIAGNOSIS — R3915 Urgency of urination: Secondary | ICD-10-CM | POA: Diagnosis not present

## 2021-01-13 DIAGNOSIS — N3281 Overactive bladder: Secondary | ICD-10-CM | POA: Diagnosis not present

## 2021-01-13 DIAGNOSIS — R351 Nocturia: Secondary | ICD-10-CM | POA: Diagnosis not present

## 2021-01-13 DIAGNOSIS — N401 Enlarged prostate with lower urinary tract symptoms: Secondary | ICD-10-CM | POA: Diagnosis not present

## 2021-01-17 ENCOUNTER — Ambulatory Visit: Payer: Medicare Other | Admitting: Allergy and Immunology

## 2021-01-19 DIAGNOSIS — M5127 Other intervertebral disc displacement, lumbosacral region: Secondary | ICD-10-CM | POA: Diagnosis not present

## 2021-01-19 DIAGNOSIS — Z6835 Body mass index (BMI) 35.0-35.9, adult: Secondary | ICD-10-CM | POA: Diagnosis not present

## 2021-01-19 DIAGNOSIS — R030 Elevated blood-pressure reading, without diagnosis of hypertension: Secondary | ICD-10-CM | POA: Diagnosis not present

## 2021-01-27 ENCOUNTER — Other Ambulatory Visit: Payer: Self-pay | Admitting: Allergy and Immunology

## 2021-01-27 DIAGNOSIS — R3914 Feeling of incomplete bladder emptying: Secondary | ICD-10-CM | POA: Diagnosis not present

## 2021-01-27 DIAGNOSIS — N401 Enlarged prostate with lower urinary tract symptoms: Secondary | ICD-10-CM | POA: Diagnosis not present

## 2021-01-27 DIAGNOSIS — N312 Flaccid neuropathic bladder, not elsewhere classified: Secondary | ICD-10-CM | POA: Diagnosis not present

## 2021-02-02 DIAGNOSIS — N401 Enlarged prostate with lower urinary tract symptoms: Secondary | ICD-10-CM | POA: Diagnosis not present

## 2021-02-02 DIAGNOSIS — J208 Acute bronchitis due to other specified organisms: Secondary | ICD-10-CM | POA: Diagnosis not present

## 2021-02-02 DIAGNOSIS — B9689 Other specified bacterial agents as the cause of diseases classified elsewhere: Secondary | ICD-10-CM | POA: Diagnosis not present

## 2021-02-02 DIAGNOSIS — N312 Flaccid neuropathic bladder, not elsewhere classified: Secondary | ICD-10-CM | POA: Diagnosis not present

## 2021-02-14 DIAGNOSIS — M5127 Other intervertebral disc displacement, lumbosacral region: Secondary | ICD-10-CM | POA: Diagnosis not present

## 2021-02-14 DIAGNOSIS — M5116 Intervertebral disc disorders with radiculopathy, lumbar region: Secondary | ICD-10-CM | POA: Diagnosis not present

## 2021-02-14 NOTE — Telephone Encounter (Signed)
ERROR

## 2021-03-05 DIAGNOSIS — G4700 Insomnia, unspecified: Secondary | ICD-10-CM | POA: Diagnosis not present

## 2021-03-05 DIAGNOSIS — R7301 Impaired fasting glucose: Secondary | ICD-10-CM | POA: Diagnosis not present

## 2021-03-05 DIAGNOSIS — F419 Anxiety disorder, unspecified: Secondary | ICD-10-CM | POA: Diagnosis not present

## 2021-03-05 DIAGNOSIS — Z6835 Body mass index (BMI) 35.0-35.9, adult: Secondary | ICD-10-CM | POA: Diagnosis not present

## 2021-03-05 DIAGNOSIS — E785 Hyperlipidemia, unspecified: Secondary | ICD-10-CM | POA: Diagnosis not present

## 2021-03-05 DIAGNOSIS — K219 Gastro-esophageal reflux disease without esophagitis: Secondary | ICD-10-CM | POA: Diagnosis not present

## 2021-03-15 DIAGNOSIS — M79605 Pain in left leg: Secondary | ICD-10-CM | POA: Diagnosis not present

## 2021-03-15 DIAGNOSIS — M5459 Other low back pain: Secondary | ICD-10-CM | POA: Diagnosis not present

## 2021-03-15 DIAGNOSIS — M79604 Pain in right leg: Secondary | ICD-10-CM | POA: Diagnosis not present

## 2021-03-15 DIAGNOSIS — M6281 Muscle weakness (generalized): Secondary | ICD-10-CM | POA: Diagnosis not present

## 2021-03-21 ENCOUNTER — Other Ambulatory Visit: Payer: Self-pay | Admitting: Allergy and Immunology

## 2021-03-23 ENCOUNTER — Other Ambulatory Visit: Payer: Self-pay

## 2021-03-23 ENCOUNTER — Ambulatory Visit (INDEPENDENT_AMBULATORY_CARE_PROVIDER_SITE_OTHER): Payer: Medicare HMO | Admitting: Allergy and Immunology

## 2021-03-23 VITALS — BP 114/64 | HR 80 | Resp 20 | Ht 71.0 in | Wt 258.8 lb

## 2021-03-23 DIAGNOSIS — K219 Gastro-esophageal reflux disease without esophagitis: Secondary | ICD-10-CM

## 2021-03-23 DIAGNOSIS — M5459 Other low back pain: Secondary | ICD-10-CM | POA: Diagnosis not present

## 2021-03-23 DIAGNOSIS — M79604 Pain in right leg: Secondary | ICD-10-CM | POA: Diagnosis not present

## 2021-03-23 DIAGNOSIS — D802 Selective deficiency of immunoglobulin A [IgA]: Secondary | ICD-10-CM

## 2021-03-23 DIAGNOSIS — J4540 Moderate persistent asthma, uncomplicated: Secondary | ICD-10-CM | POA: Diagnosis not present

## 2021-03-23 DIAGNOSIS — J3089 Other allergic rhinitis: Secondary | ICD-10-CM | POA: Diagnosis not present

## 2021-03-23 DIAGNOSIS — M79605 Pain in left leg: Secondary | ICD-10-CM | POA: Diagnosis not present

## 2021-03-23 DIAGNOSIS — M6281 Muscle weakness (generalized): Secondary | ICD-10-CM | POA: Diagnosis not present

## 2021-03-23 DIAGNOSIS — J324 Chronic pansinusitis: Secondary | ICD-10-CM | POA: Diagnosis not present

## 2021-03-23 NOTE — Patient Instructions (Addendum)
  1.  Continue to treat and prevent inflammation:   A.  OTC Nasacort-1 spray each nostril 2 time per day  B.  Symbicort 160- 2 inhalations 2 times per day     2.  If needed:   A.  Nasal saline  B.  Albuterol HFA-2 inhalations every 4-6 hours  3.  Continue to treat reflux with the following:   A.  Omeprazole 40 mg - 1 tablet 2 times per day   B.  Famotidine 40 mg - 1 tablet 2 times per day  C.  Metoclopramide 5 mg -1 tablet at bedtime  4.  Add Flovent 110 - 2 inhalations 2 times per day to Symbicort during increased asthma activity  5. Obtain sinus CT scan for chronic sinusitis  6. Blood - Area 2 aeroallergen profile  7.  Nissen fundoplication???  8. Return to clinic in 12 weeks or earlier if problem

## 2021-03-23 NOTE — Progress Notes (Signed)
[place]   Follow-up Note  Referring Provider: Nicoletta Dress, MD Primary Provider: Nicoletta Dress, MD Date of Office Visit: 03/23/2021  Subjective:   Brandon Gallagher (DOB: Jul 20, 1961) is a 60 y.o. male who returns to the All[facility] and [place] on 03/23/2021 in re-evaluation of the following:  HPI: Brandon Gallagher returns to this clinic in reevaluation of asthma and chronic sinusitis and IgA deficiency and reflux with LPR and choking.  His last visit to this clinic was 25 October 2020.  Overall his asthma is okay.  It sounds as though he uses a bronchodilator less than 1 time per week.  However, it should be noted that he has received systemic steroids on 2 occasions for "sinusitis" since his last visit in this clinic.  He continues to use Symbicort on a consistent basis.  His "sinusitis" is manifested as headache and stuffiness and green nasal discharge.  He has been treated with antibiotics and steroids for both of these episodes.  He did see his ENT doctor recently but no specific therapy was prescribed.  He has already had a sinus surgery on 2 occasions with his last surgery occurring in 2011.  He continues on a nasal steroid on a consistent basis.  He still continues to have choking episodes especially when drinking.  He has choking and coughing spell about twice a day.  He has had a barium swallow performed in the past which did identify some degree of esophageal dysmotility and he occasionally does get food hung up in his esophagus he has intermittent nausea that he treats with Tums.  He continues to use a proton pump inhibitor and H2 receptor blocker.Marland Kitchen  He is now seeing Dr. Lyda Jester regarding this issue.  Allergies as of 03/23/2021   No Known Allergies     Medication List    acetaminophen 500 MG tablet Commonly known as: TYLENOL Take 500 mg by mouth every 6 (six) hours as needed.   albuterol 108 (90 Base) MCG/ACT  inhaler Commonly known as: VENTOLIN HFA Can inhale two puffs every four to six hours as needed for cough or wheeze.   ALPRAZolam 1 MG tablet Commonly known as: XANAX Take 1 mg by mouth 3 (three) times daily as needed for anxiety.   famotidine 40 MG tablet Commonly known as: PEPCID Take one tablet by mouth one to two times daily as directed.   FIBER PO Take by mouth.   fluticasone 110 MCG/ACT inhaler Commonly known as: FLOVENT HFA Inhale 2 puffs into the lungs 2 (two) times daily as needed (for flare-ups. Rinse, gargle, and spit after use.).   metoCLOPramide 5 MG tablet Commonly known as: REGLAN Take 1 tablet by mouth at bedtime   MULTIVITAMIN MEN 50+ PO Take by mouth.   omeprazole 40 MG capsule Commonly known as: PRILOSEC Take 1 tablet 2 times per day   PRESCRIPTION MEDICATION Narcotic for post-surgery. Possibly Hydrocodone- unsure of name.  Also taking muscle relaxer as needed- unsure of name.   solifenacin 10 MG tablet Commonly known as: VESICARE Take 10 mg by mouth daily.   Symbicort 160-4.5 MCG/ACT inhaler Generic drug: budesonide-formoterol Inhale 2 puffs into the lungs 1-2 times daily   tamsulosin 0.4 MG Caps capsule Commonly known as: FLOMAX   triamcinolone 55 MCG/ACT Aero nasal inhaler Commonly known as: NASACORT Use 1 spray in each nostril once daily   venlafaxine XR 75 MG 24 hr capsule Commonly known as: EFFEXOR-XR Take 75  mg by mouth daily.   zolpidem 5 MG tablet Commonly known as: AMBIEN Take 5 mg by mouth at bedtime as needed.       Past Medical History:  Diagnosis Date  . Acute respiratory disease due to COVID-19 virus 10/30/2019  . Acute respiratory failure with hypoxia ([place]) 10/30/2019  . Allergy   . Anxiety   . Arthritis   . Asthma   . Blood transfusion without reported diagnosis   . Bronchitis    last had back in 2017   Sees no pulmonary md  . Cervical spondylosis with radiculopathy 01/01/2017  . Chronic bilateral low back pain  without sciatica 01/10/2016  . COPD (chronic obstructive pulmonary disease) ([place])   . Depression   . Diverticulitis of sigmoid colon 02/05/2020  . Diverticulosis    last flare up was in Jan. 2018  Was on course of antibiotics for this  . Esophageal stricture   . Esophagus disorder    narrowing of esophagus.  Had to have dilatation 09/2016  . Fall 01/10/2016  . Family history of adverse reaction to anesthesia    son had severe N/V with his knee surgery  . Gastric polyp   . GERD (gastroesophageal reflux disease)   . Headache   . History of Clostridioides difficile colitis   . History of colon polyps   . IBS (irritable bowel syndrome)   . 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 Morbid obesity due to excess calories ([place]) 07/28/2015  . OSA (obstructive sleep apnea) 10/06/2015  . Pain in joint, ankle and foot 11/22/2018   Right:  . Presbyesophagus   . S/P lumbar laminectomy 11/01/2015   Overview:  AXIALIF L5-S1 with percutaneous robot assisted pedicle scre and rod instrumentation on 08/20/15- Dr. Elnoria Howard   Overview:  S/P minimally invasive lumbar laminectomy L3-4 performed on 10/08/2015 by Dr. Elnoria Howard   . Sacroiliac joint pain 07/27/2016  . Sinus bradycardia by electrocardiogram 10/30/2019  . Sleep apnea    no CPAP- unsure if true diagnosis or not  . Thrombosed external hemorrhoid     Past Surgical History:  Procedure Laterality Date  . ANTERIOR CERVICAL DECOMP/DISCECTOMY FUSION N/A 01/01/2017   Procedure: Cervical four- five and Cervical six- seven Anterior cervical discectomy with fusion and plate fixation;  Surgeon: Kevan Ny Ditty, MD;  Location: [facility];  Service: Neurosurgery;  Laterality: N/A;  C4-5 and C6-7 Anterior cervical discectomy with fusion and plate fixation  . BACK SURGERY     x 2    @ [facility]        Last one 09/2015  . CARDIAC CATHETERIZATION     He thinks.   Not really sure where at, thought it was here @ [facility], but its been a LONG time ?2000  . CARPAL TUNNEL RELEASE  Bilateral   . COLECTOMY    . COLONOSCOPY  10/22/2017   Colonic polyp status post polypectomy. Moderate predominantly sigmoid diverticulosis.   . COLONOSCOPY  2019   Dr Orlena Sheldon  . ESOPHAGOGASTRODUODENOSCOPY  06/08/2016   Distal esophageal stricture status post esophageal dilatation. Presbtesophagus, also suggestive of motility disorder. Mild gastritis.   Marland Kitchen evacuation thrombosed external hemorrhoid  2021  . KNEE SURGERY Right   . LUMBAR LAMINECTOMY  10/08/2015   L3-4, Surgeon Deirdre Evener, MD  . NASAL SINUS SURGERY     HAS HAD 2 OR 3.   LAST ONE 2011, IN [place]  (LEFT NASAL PASSAGE IS NARROWER THAN RIGHT  . SACROILIAC JOINT INJECTION Bilateral 01/10/2016  .  UPPER GASTROINTESTINAL ENDOSCOPY      Review of systems negative except as noted in HPI / PMHx or noted below:  Review of Systems  Constitutional: Negative.   HENT: Negative.   Eyes: Negative.   Respiratory: Negative.   Cardiovascular: Negative.   Gastrointestinal: Negative.   Genitourinary: Negative.   Musculoskeletal: Negative.   Skin: Negative.   Neurological: Negative.   Endo/Heme/Allergies: Negative.   Psychiatric/Behavioral: Negative.      Objective:   Vitals:   03/23/21 0820  BP: 114/64  Pulse: 80  Resp: 20  SpO2: 97%   Height: 5\' 11"  (180.3 cm)  Weight: 258 lb 12.8 oz (117.4 kg)   Physical Exam Constitutional:      Appearance: He is not diaphoretic.  HENT:     Head: Normocephalic.     Right Ear: Tympanic membrane, ear canal and external ear normal.     Left Ear: Tympanic membrane, ear canal and external ear normal.     Nose: Nose normal. No mucosal edema or rhinorrhea.     Mouth/Throat:     Pharynx: Uvula midline. No oropharyngeal exudate.  Eyes:     Conjunctiva/sclera: Conjunctivae normal.  Neck:     Thyroid: No thyromegaly.     Trachea: Trachea normal. No tracheal tenderness or tracheal deviation.  Cardiovascular:     Rate and Rhythm: Normal rate and regular rhythm.     Heart  sounds: Normal heart sounds, S1 normal and S2 normal. No murmur heard.   Pulmonary:     Effort: No respiratory distress.     Breath sounds: Normal breath sounds. No stridor. No wheezing or rales.  Lymphadenopathy:     Head:     Right side of head: No tonsillar adenopathy.     Left side of head: No tonsillar adenopathy.     Cervical: No cervical adenopathy.  Skin:    Findings: No erythema or rash.     Nails: There is no clubbing.  Neurological:     Mental Status: He is alert.     Diagnostics:    Spirometry was performed and demonstrated an FEV1 of 2.90 at 79 % of predicted.  Assessment and Plan:   1. Not well controlled moderate persistent asthma   2. Perennial allergic rhinitis   3. Chronic pansinusitis   4. 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 Gastroesophageal reflux disease, unspecified whether esophagitis present     1.  Continue to treat and prevent inflammation:   A.  OTC Nasacort-1 spray each nostril 2 time per day  B.  Symbicort 160- 2 inhalations 2 times per day     2.  If needed:   A.  Nasal saline  B.  Albuterol HFA-2 inhalations every 4-6 hours  3.  Continue to treat reflux with the following:   A.  Omeprazole 40 mg - 1 tablet 2 times per day   B.  Famotidine 40 mg - 1 tablet 2 times per day  C.  Metoclopramide 5 mg -1 tablet at bedtime  4.  Add Flovent 110 - 2 inhalations 2 times per day to Symbicort during increased asthma activity  5. Obtain sinus CT scan for chronic sinusitis  6. Blood - Area 2 aeroallergen profile  7.  Nissen fundoplication???  8. Return to clinic in 12 weeks or earlier if problem  I am going to image Dave's airway as he has not had a sinus CT scan performed in a long period in time to define his upper airway anatomy.  As well, we will check an area two aero allergen profile to look for atopic disease driving some of his symptoms.  He will continue on anti-inflammatory agents for both his upper and lower airway at this point.  He will  continue to treat reflux as noted above.  He has choking/coughing spells when drinking fluid so there is obviously a problem and he will be visiting his gastroenterologist soon to address this issue.  He has horrendous reflux requiring multiple medications and may be he is a candidate for Nissen fundoplication.  I will contact him with the results of his diagnostic studies once they are available for review.  Allena Katz, MD Allergy / Immunology [place]

## 2021-03-24 ENCOUNTER — Telehealth: Payer: Self-pay

## 2021-03-24 ENCOUNTER — Encounter: Payer: Self-pay | Admitting: Allergy and Immunology

## 2021-03-24 NOTE — Telephone Encounter (Signed)
Called patient and let him know that he is scheduled for the sinus CT scan on Monday, May 16th at [facility], needing to check-in at the [facility] at 10:15am.  Informed him that he cannot eat any solid foods starting 2 hours prior to scan.    Form has been faxed to [facility].  Prior authorization number [id number] through [facility], valid 03/28/21 - 04/27/21.

## 2021-03-25 DIAGNOSIS — N401 Enlarged prostate with lower urinary tract symptoms: Secondary | ICD-10-CM | POA: Diagnosis not present

## 2021-03-25 DIAGNOSIS — J208 Acute bronchitis due to other specified organisms: Secondary | ICD-10-CM | POA: Diagnosis not present

## 2021-03-25 DIAGNOSIS — R3915 Urgency of urination: Secondary | ICD-10-CM | POA: Diagnosis not present

## 2021-03-25 DIAGNOSIS — R3914 Feeling of incomplete bladder emptying: Secondary | ICD-10-CM | POA: Diagnosis not present

## 2021-03-25 DIAGNOSIS — B9689 Other specified bacterial agents as the cause of diseases classified elsewhere: Secondary | ICD-10-CM | POA: Diagnosis not present

## 2021-03-28 DIAGNOSIS — J0110 Acute frontal sinusitis, unspecified: Secondary | ICD-10-CM | POA: Diagnosis not present

## 2021-03-28 DIAGNOSIS — J329 Chronic sinusitis, unspecified: Secondary | ICD-10-CM | POA: Diagnosis not present

## 2021-03-28 DIAGNOSIS — J321 Chronic frontal sinusitis: Secondary | ICD-10-CM | POA: Diagnosis not present

## 2021-03-28 DIAGNOSIS — J3489 Other specified disorders of nose and nasal sinuses: Secondary | ICD-10-CM | POA: Diagnosis not present

## 2021-03-28 DIAGNOSIS — Z9889 Other specified postprocedural states: Secondary | ICD-10-CM | POA: Diagnosis not present

## 2021-03-29 DIAGNOSIS — M79605 Pain in left leg: Secondary | ICD-10-CM | POA: Diagnosis not present

## 2021-03-29 DIAGNOSIS — M5459 Other low back pain: Secondary | ICD-10-CM | POA: Diagnosis not present

## 2021-03-29 DIAGNOSIS — M6281 Muscle weakness (generalized): Secondary | ICD-10-CM | POA: Diagnosis not present

## 2021-03-29 DIAGNOSIS — M79604 Pain in right leg: Secondary | ICD-10-CM | POA: Diagnosis not present

## 2021-03-30 LAB — ALLERGENS W/TOTAL IGE AREA 2
Alternaria Alternata IgE: <0.1 kU/L
Aspergillus Fumigatus IgE: <0.1 kU/L
Bermuda Grass IgE: <0.1 kU/L
Cat Dander IgE: <0.1 kU/L
Cedar, Mountain IgE: <0.1 kU/L
Cladosporium Herbarum IgE: <0.1 kU/L
Cockroach, German IgE: <0.1 kU/L
Common Silver Birch IgE: <0.1 kU/L
Cottonwood IgE: <0.1 kU/L
D Farinae IgE: <0.1 kU/L
D Pteronyssinus IgE: <0.1 kU/L
Dog Dander IgE: <0.1 kU/L
Elm, American IgE: <0.1 kU/L
IgE (Immunoglobulin E), Serum: <2 IU/mL — ABNORMAL LOW (ref 6–495)
Johnson Grass IgE: <0.1 kU/L
Maple/Box Elder IgE: <0.1 kU/L
Mouse Urine IgE: <0.1 kU/L
Oak, White IgE: <0.1 kU/L
Pecan, Hickory IgE: <0.1 kU/L
Penicillium Chrysogen IgE: <0.1 kU/L
Pigweed, Rough IgE: <0.1 kU/L
Ragweed, Short IgE: <0.1 kU/L
Sheep Sorrel IgE Qn: <0.1 kU/L
Timothy Grass IgE: <0.1 kU/L
White Mulberry IgE: <0.1 kU/L

## 2021-04-01 ENCOUNTER — Telehealth: Payer: Self-pay

## 2021-04-01 MED ORDER — BUDESONIDE 0.5 MG/2ML IN SUSP: RESPIRATORY_TRACT | 5 refills | Status: DC

## 2021-04-01 NOTE — Telephone Encounter (Signed)
Called and informed Brandon Gallagher that Dr. Neldon Mc got his Sinus CT Scan and per Dr. Neldon Mc, no evidence of sinusitis, but some inflamed tissue in the sinuses.  Change Nasacort to Budesonide washes twice daily: ( 1 ampule budesonide 0.5mg  in 200-250 mL distilled water or saline).  Patient voiced understanding.  Will send in [place] to [facility].

## 2021-04-02 DIAGNOSIS — Z20822 Contact with and (suspected) exposure to covid-19: Secondary | ICD-10-CM | POA: Diagnosis not present

## 2021-04-05 ENCOUNTER — Other Ambulatory Visit: Payer: Self-pay | Admitting: Sports Medicine

## 2021-04-05 NOTE — Telephone Encounter (Signed)
Please advise 

## 2021-04-08 ENCOUNTER — Encounter: Payer: Self-pay | Admitting: Allergy and Immunology

## 2021-04-21 DIAGNOSIS — K5730 Diverticulosis of large intestine without perforation or abscess without bleeding: Secondary | ICD-10-CM | POA: Diagnosis not present

## 2021-04-21 DIAGNOSIS — K219 Gastro-esophageal reflux disease without esophagitis: Secondary | ICD-10-CM | POA: Diagnosis not present

## 2021-04-21 DIAGNOSIS — R1310 Dysphagia, unspecified: Secondary | ICD-10-CM | POA: Diagnosis not present

## 2021-05-01 DIAGNOSIS — M5440 Lumbago with sciatica, unspecified side: Secondary | ICD-10-CM | POA: Diagnosis not present

## 2021-05-01 DIAGNOSIS — M5430 Sciatica, unspecified side: Secondary | ICD-10-CM | POA: Diagnosis not present

## 2021-05-04 DIAGNOSIS — M79605 Pain in left leg: Secondary | ICD-10-CM | POA: Diagnosis not present

## 2021-05-04 DIAGNOSIS — M5459 Other low back pain: Secondary | ICD-10-CM | POA: Diagnosis not present

## 2021-05-04 DIAGNOSIS — M6281 Muscle weakness (generalized): Secondary | ICD-10-CM | POA: Diagnosis not present

## 2021-05-09 DIAGNOSIS — M79605 Pain in left leg: Secondary | ICD-10-CM | POA: Diagnosis not present

## 2021-05-09 DIAGNOSIS — M6281 Muscle weakness (generalized): Secondary | ICD-10-CM | POA: Diagnosis not present

## 2021-05-09 DIAGNOSIS — M5459 Other low back pain: Secondary | ICD-10-CM | POA: Diagnosis not present

## 2021-05-11 DIAGNOSIS — M79605 Pain in left leg: Secondary | ICD-10-CM | POA: Diagnosis not present

## 2021-05-11 DIAGNOSIS — M5459 Other low back pain: Secondary | ICD-10-CM | POA: Diagnosis not present

## 2021-05-11 DIAGNOSIS — M6281 Muscle weakness (generalized): Secondary | ICD-10-CM | POA: Diagnosis not present

## 2021-05-17 DIAGNOSIS — M5459 Other low back pain: Secondary | ICD-10-CM | POA: Diagnosis not present

## 2021-05-17 DIAGNOSIS — M6281 Muscle weakness (generalized): Secondary | ICD-10-CM | POA: Diagnosis not present

## 2021-05-17 DIAGNOSIS — M79605 Pain in left leg: Secondary | ICD-10-CM | POA: Diagnosis not present

## 2021-05-20 DIAGNOSIS — K635 Polyp of colon: Secondary | ICD-10-CM | POA: Diagnosis not present

## 2021-05-20 DIAGNOSIS — D126 Benign neoplasm of colon, unspecified: Secondary | ICD-10-CM | POA: Diagnosis not present

## 2021-05-20 DIAGNOSIS — D131 Benign neoplasm of stomach: Secondary | ICD-10-CM | POA: Diagnosis not present

## 2021-05-20 DIAGNOSIS — K222 Esophageal obstruction: Secondary | ICD-10-CM | POA: Diagnosis not present

## 2021-05-20 DIAGNOSIS — Z86010 Personal history of colonic polyps: Secondary | ICD-10-CM | POA: Diagnosis not present

## 2021-05-20 DIAGNOSIS — K449 Diaphragmatic hernia without obstruction or gangrene: Secondary | ICD-10-CM | POA: Diagnosis not present

## 2021-05-20 DIAGNOSIS — K52832 Lymphocytic colitis: Secondary | ICD-10-CM | POA: Diagnosis not present

## 2021-05-20 DIAGNOSIS — R1310 Dysphagia, unspecified: Secondary | ICD-10-CM | POA: Diagnosis not present

## 2021-05-20 DIAGNOSIS — K317 Polyp of stomach and duodenum: Secondary | ICD-10-CM | POA: Diagnosis not present

## 2021-05-20 DIAGNOSIS — K2970 Gastritis, unspecified, without bleeding: Secondary | ICD-10-CM | POA: Diagnosis not present

## 2021-05-25 DIAGNOSIS — Z6836 Body mass index (BMI) 36.0-36.9, adult: Secondary | ICD-10-CM | POA: Diagnosis not present

## 2021-05-25 DIAGNOSIS — M5127 Other intervertebral disc displacement, lumbosacral region: Secondary | ICD-10-CM | POA: Diagnosis not present

## 2021-06-06 DIAGNOSIS — M545 Low back pain, unspecified: Secondary | ICD-10-CM | POA: Diagnosis not present

## 2021-06-06 DIAGNOSIS — M5127 Other intervertebral disc displacement, lumbosacral region: Secondary | ICD-10-CM | POA: Diagnosis not present

## 2021-06-14 DIAGNOSIS — M5127 Other intervertebral disc displacement, lumbosacral region: Secondary | ICD-10-CM | POA: Diagnosis not present

## 2021-06-15 ENCOUNTER — Telehealth: Payer: Medicare HMO | Admitting: Allergy and Immunology

## 2021-06-15 NOTE — Telephone Encounter (Signed)
Talked with patient.  When he uses the Budesonide nasal saline wash, most of it ends up coming out of his mouth instead of coming out of the opposite nostril.  Patient states this has been a problem in the past when he used other nasal washes due to a hole he has in his nose.  Patient is wondering if he is benefiting for using this, since so much of the fluid ends up in mouth and not rinsing through to the opposite nostril.  Please advise.

## 2021-06-15 NOTE — Telephone Encounter (Signed)
Patient called about the budesonide that dr Neldon Mc gave him. He has some question about it. [phone]

## 2021-06-16 NOTE — Telephone Encounter (Signed)
Please inform Brandon Gallagher that he should continue to use the nasal washes.  The effectiveness of his nasal washes will be determined by how he does over time.  Even though his technique may seem somewhat off what will determine what we do in the future will be determined by his response to this treatment on a consistent basis.

## 2021-06-16 NOTE — Telephone Encounter (Signed)
Patient informed of Dr. Bruna Potter message.  He will call back to make return appointment.  He is about to have surgery next week and will need time to recover before he can come in.

## 2021-06-20 DIAGNOSIS — Z9889 Other specified postprocedural states: Secondary | ICD-10-CM | POA: Diagnosis not present

## 2021-06-20 DIAGNOSIS — M5127 Other intervertebral disc displacement, lumbosacral region: Secondary | ICD-10-CM | POA: Diagnosis not present

## 2021-06-20 DIAGNOSIS — M5116 Intervertebral disc disorders with radiculopathy, lumbar region: Secondary | ICD-10-CM | POA: Diagnosis not present

## 2021-06-25 DIAGNOSIS — U071 COVID-19: Secondary | ICD-10-CM | POA: Diagnosis not present

## 2021-06-25 DIAGNOSIS — G8929 Other chronic pain: Secondary | ICD-10-CM | POA: Diagnosis not present

## 2021-06-25 DIAGNOSIS — M545 Low back pain, unspecified: Secondary | ICD-10-CM | POA: Diagnosis not present

## 2021-07-02 DIAGNOSIS — B9689 Other specified bacterial agents as the cause of diseases classified elsewhere: Secondary | ICD-10-CM | POA: Diagnosis not present

## 2021-07-02 DIAGNOSIS — J208 Acute bronchitis due to other specified organisms: Secondary | ICD-10-CM | POA: Diagnosis not present

## 2021-07-02 DIAGNOSIS — Z6837 Body mass index (BMI) 37.0-37.9, adult: Secondary | ICD-10-CM | POA: Diagnosis not present

## 2021-08-01 ENCOUNTER — Encounter: Payer: Self-pay | Admitting: Gastroenterology

## 2021-08-08 ENCOUNTER — Other Ambulatory Visit: Payer: Self-pay | Admitting: Radiation Therapy

## 2021-08-11 DIAGNOSIS — L820 Inflamed seborrheic keratosis: Secondary | ICD-10-CM | POA: Diagnosis not present

## 2021-08-22 ENCOUNTER — Ambulatory Visit: Payer: Medicare HMO | Admitting: Allergy and Immunology

## 2021-08-24 ENCOUNTER — Ambulatory Visit: Payer: Medicare HMO | Admitting: Allergy and Immunology

## 2021-08-24 ENCOUNTER — Encounter: Payer: Self-pay | Admitting: Allergy and Immunology

## 2021-08-24 ENCOUNTER — Other Ambulatory Visit: Payer: Self-pay

## 2021-08-24 VITALS — BP 110/62 | HR 103 | Resp 16

## 2021-08-24 DIAGNOSIS — D802 Selective deficiency of immunoglobulin A [IgA]: Secondary | ICD-10-CM | POA: Diagnosis not present

## 2021-08-24 DIAGNOSIS — J3089 Other allergic rhinitis: Secondary | ICD-10-CM | POA: Diagnosis not present

## 2021-08-24 DIAGNOSIS — J4540 Moderate persistent asthma, uncomplicated: Secondary | ICD-10-CM | POA: Diagnosis not present

## 2021-08-24 DIAGNOSIS — K219 Gastro-esophageal reflux disease without esophagitis: Secondary | ICD-10-CM | POA: Diagnosis not present

## 2021-08-24 MED ORDER — FLUCONAZOLE 150 MG PO TABS: 150.0000 mg | ORAL_TABLET | Freq: Once | ORAL | 0 refills | Status: AC

## 2021-08-24 NOTE — Progress Notes (Signed)
[place]   Follow-up Note  Referring Provider: Nicoletta Dress, MD Primary Provider: Nicoletta Dress, MD Date of Office Visit: 08/24/2021  Subjective:   Brandon Gallagher (DOB: 11/30/1960) is a 60 y.o. male who returns to the Allergy and [place] on 08/24/2021 in re-evaluation of the following:  HPI: [name] returns to this clinic in evaluation of asthma, chronic sinusitis in the context of IgA deficiency, reflux with LPR.  His last visit to this clinic was 23 Mar 2021.  His asthma has been doing relatively well.  His nose has actually been doing relatively well.  He does not use a bronchodilator very often.  He apparently did have an episode of "sinusitis" recently that might of been treated with an antibiotic but otherwise does not have any wheezing or coughing or shortness of breath and his nose is not particularly stuffy.  We started him on budesonide nasal washes once we receive the results of his sinus CT scan performed in May which identified no significant amount of sinusitis.  He has not been having any headaches.  He underwent upper endoscopy with his gastroenterologist, Dr. Lyda Jester, and apparently a esophageal dilation was performed which did help his choking issues but now he has bad reflux.  He continues on a proton pump inhibitor and H2 receptor blocker and has adding in some antacids.  He has been having a little bit of a scratchy throat and noticed some white patches in his throat over the course of the past week.  He has undergone 2 back procedures since I have seen him in this clinic.  He is now using a narcotic on a consistent basis twice a day which is helping his back issue.  He has been immunized with COVID and then had a very severe COVID infection requiring hospitalization in December 2021 and has had 2 other very mild infections since that point in time.  Allergies as of 08/24/2021   No Known  Allergies      Medication List    acetaminophen 500 MG tablet Commonly known as: TYLENOL Take 500 mg by mouth every 6 (six) hours as needed.   albuterol 108 (90 Base) MCG/ACT inhaler Commonly known as: VENTOLIN HFA Can inhale two puffs every four to six hours as needed for cough or wheeze.   ALPRAZolam 1 MG tablet Commonly known as: XANAX Take 1 mg by mouth 3 (three) times daily as needed for anxiety.   budesonide 0.5 MG/2ML nebulizer solution Commonly known as: PULMICORT Mix one ampule into 200-250 mL of distilled water/saline and irrigate nasal passages twice daily as directed.   famotidine 40 MG tablet Commonly known as: PEPCID Take one tablet by mouth one to two times daily as directed.   FIBER PO Take by mouth.   fluticasone 110 MCG/ACT inhaler Commonly known as: FLOVENT HFA Inhale 2 puffs into the lungs 2 (two) times daily as needed (for flare-ups. Rinse, gargle, and spit after use.).   gabapentin 300 MG capsule Commonly known as: NEURONTIN Take 1 capsule (300 mg total) by mouth at bedtime.   HYDROcodone-acetaminophen 5-325 MG tablet Commonly known as: NORCO/VICODIN take 1 tablet every 4 hours as needed for pain   metoCLOPramide 5 MG tablet Commonly known as: REGLAN Take 1 tablet by mouth at bedtime   MULTIVITAMIN MEN 50+ PO Take by mouth.   omeprazole 40 MG capsule Commonly known as: PRILOSEC Take 1 tablet 2 times per day  PRESCRIPTION MEDICATION Narcotic for post-surgery. Possibly Hydrocodone- unsure of name.  Also taking muscle relaxer as needed- unsure of name.   solifenacin 10 MG tablet Commonly known as: VESICARE Take 10 mg by mouth daily.   Symbicort 160-4.5 MCG/ACT inhaler Generic drug: budesonide-formoterol Inhale 2 puffs into the lungs 1-2 times daily   tamsulosin 0.4 MG Caps capsule Commonly known as: FLOMAX   triamcinolone 55 MCG/ACT Aero nasal inhaler Commonly known as: NASACORT Use 1 spray in each nostril once daily    venlafaxine XR 75 MG 24 hr capsule Commonly known as: EFFEXOR-XR Take 75 mg by mouth daily.   zolpidem 5 MG tablet Commonly known as: AMBIEN Take 5 mg by mouth at bedtime as needed.    Past Medical History:  Diagnosis Date   Acute respiratory disease due to COVID-19 virus 10/30/2019   Acute respiratory failure with hypoxia ([facility]) 10/30/2019   Allergy    Anxiety    Arthritis    Asthma    Blood transfusion without reported diagnosis    Bronchitis    last had back in 2017   Sees no pulmonary md   Cervical spondylosis with radiculopathy 01/01/2017   Chronic bilateral low back pain without sciatica 01/10/2016   COPD (chronic obstructive pulmonary disease) ([place])    Depression    Diverticulitis of sigmoid colon 02/05/2020   Diverticulosis    last flare up was in Jan. 2018  Was on course of antibiotics for this   Esophageal stricture    Esophagus disorder    narrowing of esophagus.  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 Elnoria Howard   Overview:  S/P minimally invasive lumbar laminectomy L3-4 performed on 10/08/2015 by Dr. Elnoria Howard    Sacroiliac joint pain 07/27/2016   Sinus bradycardia by electrocardiogram 10/30/2019   Sleep apnea    no CPAP- unsure if true diagnosis or not   Thrombosed external hemorrhoid     Past Surgical History:  Procedure Laterality Date    ANTERIOR CERVICAL DECOMP/DISCECTOMY FUSION N/A 01/01/2017   Procedure: Cervical four- five and Cervical six- seven Anterior cervical discectomy with fusion and plate fixation;  Surgeon: Kevan Ny Ditty, MD;  Location: [facility];  Service: Neurosurgery;  Laterality: N/A;  C4-5 and C6-7 Anterior cervical discectomy with fusion and plate fixation   BACK SURGERY     x 2    @ [facility]        Last one 09/2015   CARDIAC CATHETERIZATION     He thinks.   Not really sure where at, thought it was here @ [facility], but its been a LONG time ?2000   CARPAL TUNNEL RELEASE Bilateral    COLECTOMY     COLONOSCOPY  10/22/2017   Colonic polyp status post polypectomy. Moderate predominantly sigmoid diverticulosis.  COLONOSCOPY  2019   Dr Orlena Sheldon   ESOPHAGOGASTRODUODENOSCOPY  06/08/2016   Distal esophageal stricture status post esophageal dilatation. Presbtesophagus, also suggestive of motility disorder. Mild gastritis.    evacuation thrombosed external hemorrhoid  2021   KNEE SURGERY Right    LUMBAR LAMINECTOMY  10/08/2015   L3-4, Surgeon Deirdre Evener, MD   NASAL SINUS SURGERY     HAS HAD 2 OR 3.   LAST ONE 2011, IN [place]  (LEFT NASAL PASSAGE IS NARROWER THAN RIGHT   SACROILIAC JOINT INJECTION Bilateral 01/10/2016   UPPER GASTROINTESTINAL ENDOSCOPY      Review of systems negative except as noted in HPI / PMHx or noted below:  Review of Systems  Constitutional: Negative.   HENT: Negative.    Eyes: Negative.   Respiratory: Negative.    Cardiovascular: Negative.   Gastrointestinal: Negative.   Genitourinary: Negative.   Musculoskeletal: Negative.   Skin: Negative.   Neurological: Negative.   Endo/Heme/Allergies: Negative.   Psychiatric/Behavioral: Negative.      Objective:   Vitals:   08/24/21 1353  BP: 110/62  Pulse: (!) 103  Resp: 16  SpO2: 97%          Physical Exam Constitutional:      Appearance: He is not diaphoretic.  HENT:     Head: Normocephalic.     Right Ear:  Tympanic membrane, ear canal and external ear normal.     Left Ear: Tympanic membrane, ear canal and external ear normal.     Nose: Nose normal. No mucosal edema (Septal perforation with clear borders) or rhinorrhea.     Mouth/Throat:     Pharynx: Uvula midline. Oropharyngeal exudate ([place]) present.  Eyes:     Conjunctiva/sclera: Conjunctivae normal.  Neck:     Thyroid: No thyromegaly.     Trachea: Trachea normal. No tracheal tenderness or tracheal deviation.  Cardiovascular:     Rate and Rhythm: Normal rate and regular rhythm.     Heart sounds: Normal heart sounds, S1 normal and S2 normal. No murmur heard. Pulmonary:     Effort: No respiratory distress.     Breath sounds: Normal breath sounds. No stridor. No wheezing or rales.  Lymphadenopathy:     Head:     Right side of head: No tonsillar adenopathy.     Left side of head: No tonsillar adenopathy.     Cervical: No cervical adenopathy.  Skin:    Findings: No erythema or rash.     Nails: There is no clubbing.  Neurological:     Mental Status: He is alert.    Diagnostics:    Spirometry was performed and demonstrated an FEV1 of 2.79 at 76 % of predicted.  Results of a sinus CT scan obtained 28 Mar 2021 identified mucosal thickening of a mild extent located in the frontal sinuses, maxillary sinuses, with clear ethmoid and sphenoid sinuses and patent ostiomeatal complexes bilaterally.  There appears to be a small defect within the anterior aspect of the nasal septum.  Results of blood tests obtained 23 Mar 2021 identified serum IgE less than 2 KU/L, no antigen specific IgE antibodies identified on an area two aero allergen profile.  Assessment and Plan:   1. Asthma, moderate persistent, well-controlled   2. Perennial allergic rhinitis   3. Gastroesophageal reflux disease, unspecified whether esophagitis present   4. 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  Continue to treat and prevent inflammation:   A.  Budesonide washes 1-2 times  per day  B.  Symbicort 160- 2 inhalations 2 times per day     2.  If needed:   A.  Nasal saline  B.  Albuterol HFA-2 inhalations every 4-6 hours  3.  Continue to treat reflux with the following:   A.  Omeprazole 40 mg - 1 tablet 2 times per day   B.  Famotidine 40 mg - 1 tablet 2 times per day  C.  Metoclopramide 5 mg -1 tablet at bedtime  D.  Magnesium based antacids  4.  Add Flovent 110 - 2 inhalations 2 times per day to Symbicort during increased asthma activity  5. Blood - IgA/G/M, anti-pneumococcal antibodies  6. Obtain fall flu vaccine  7. Diflucan 150 mg - 1 tablet today only  8. Return to clinic in 6 months or earlier if problem  Dmitri appears to be stable regarding his respiratory tract issue while he utilizes a collection of anti-inflammatory agents as noted above.  But his reflux is another story.  He still continues to have significant problems with reflux even in the face of very aggressive therapy as noted above.  I had discussed with him in the past about possibly considering a Nissen fundoplication and that is still a viable option.  He has no IgE antibodies and has already had documented IgA deficiency and we immunized him with a pneumococcal vaccine in the past and he had a very good response to that vaccine administration in 2020. To see if he can maintain high titer antibodies against pneumococcus we will check pneumococcal antibody titers as well as immunoglobulin levels.  He appears to have some thrush and I have given a single dose of Diflucan which should take care of this issue.  Allena Katz, MD Allergy / Immunology [place]

## 2021-08-24 NOTE — Patient Instructions (Addendum)
  1.  Continue to treat and prevent inflammation:   A.  Budesonide washes 1-2 times per day  B.  Symbicort 160- 2 inhalations 2 times per day     2.  If needed:   A.  Nasal saline  B.  Albuterol HFA-2 inhalations every 4-6 hours  3.  Continue to treat reflux with the following:   A.  Omeprazole 40 mg - 1 tablet 2 times per day   B.  Famotidine 40 mg - 1 tablet 2 times per day  C.  Metoclopramide 5 mg -1 tablet at bedtime  D.  Magnesium based antacids  4.  Add Flovent 110 - 2 inhalations 2 times per day to Symbicort during increased asthma activity  5. Blood - IgA/G/M, anti-pneumococcal antibodies  6. Obtain fall flu vaccine  7. Diflucan 150 mg - 1 tablet today only  8. Return to clinic in 6 months or earlier if problem

## 2021-08-25 ENCOUNTER — Encounter: Payer: Self-pay | Admitting: Allergy and Immunology

## 2021-08-25 NOTE — Addendum Note (Signed)
Addended by: Guy Franco on: 08/25/2021 02:01 PM   Modules accepted: Orders

## 2021-08-29 DIAGNOSIS — J441 Chronic obstructive pulmonary disease with (acute) exacerbation: Secondary | ICD-10-CM | POA: Diagnosis not present

## 2021-09-03 DIAGNOSIS — Z20822 Contact with and (suspected) exposure to covid-19: Secondary | ICD-10-CM | POA: Diagnosis not present

## 2021-09-03 DIAGNOSIS — Z981 Arthrodesis status: Secondary | ICD-10-CM | POA: Diagnosis not present

## 2021-09-03 DIAGNOSIS — R0602 Shortness of breath: Secondary | ICD-10-CM | POA: Diagnosis not present

## 2021-09-03 DIAGNOSIS — J441 Chronic obstructive pulmonary disease with (acute) exacerbation: Secondary | ICD-10-CM | POA: Diagnosis not present

## 2021-09-08 LAB — IGG, IGA, IGM
IgA/Immunoglobulin A, Serum: <5 mg/dL — ABNORMAL LOW (ref 61–437)
IgG (Immunoglobin G), Serum: 768 mg/dL (ref 603–1613)
IgM (Immunoglobulin M), Srm: 52 mg/dL (ref 20–172)

## 2021-09-08 LAB — STREP PNEUMONIAE 23 SEROTYPES IGG
Pneumo Ab Type 1*: <0.1 ug/mL — ABNORMAL LOW (ref >1.3)
Pneumo Ab Type 12 (12F)*: <0.1 ug/mL — ABNORMAL LOW (ref >1.3)
Pneumo Ab Type 14*: 0.1 ug/mL — ABNORMAL LOW (ref >1.3)
Pneumo Ab Type 17 (17F)*: 0.2 ug/mL — ABNORMAL LOW (ref >1.3)
Pneumo Ab Type 19 (19F)*: 0.2 ug/mL — ABNORMAL LOW (ref >1.3)
Pneumo Ab Type 2*: <0.1 ug/mL — ABNORMAL LOW (ref >1.3)
Pneumo Ab Type 20*: 0.1 ug/mL — ABNORMAL LOW (ref >1.3)
Pneumo Ab Type 22 (22F)*: <0.1 ug/mL — ABNORMAL LOW (ref >1.3)
Pneumo Ab Type 23 (23F)*: <0.1 ug/mL — ABNORMAL LOW (ref >1.3)
Pneumo Ab Type 26 (6B)*: <0.1 ug/mL — ABNORMAL LOW (ref >1.3)
Pneumo Ab Type 3*: 0.5 ug/mL — ABNORMAL LOW (ref >1.3)
Pneumo Ab Type 34 (10A)*: 0.1 ug/mL — ABNORMAL LOW (ref >1.3)
Pneumo Ab Type 4*: <0.1 ug/mL — ABNORMAL LOW (ref >1.3)
Pneumo Ab Type 43 (11A)*: <0.1 ug/mL — ABNORMAL LOW (ref >1.3)
Pneumo Ab Type 5*: 0.5 ug/mL — ABNORMAL LOW (ref >1.3)
Pneumo Ab Type 51 (7F)*: 0.1 ug/mL — ABNORMAL LOW (ref >1.3)
Pneumo Ab Type 54 (15B)*: <0.1 ug/mL — ABNORMAL LOW (ref >1.3)
Pneumo Ab Type 56 (18C)*: <0.1 ug/mL — ABNORMAL LOW (ref >1.3)
Pneumo Ab Type 57 (19A)*: 0.3 ug/mL — ABNORMAL LOW (ref >1.3)
Pneumo Ab Type 68 (9V)*: 0.3 ug/mL — ABNORMAL LOW (ref >1.3)
Pneumo Ab Type 70 (33F)*: 0.4 ug/mL — ABNORMAL LOW (ref >1.3)
Pneumo Ab Type 8*: 0.1 ug/mL — ABNORMAL LOW (ref >1.3)
Pneumo Ab Type 9 (9N)*: <0.1 ug/mL — ABNORMAL LOW (ref >1.3)

## 2021-09-09 ENCOUNTER — Other Ambulatory Visit: Payer: Self-pay | Admitting: *Deleted

## 2021-09-09 DIAGNOSIS — D801 Nonfamilial hypogammaglobulinemia: Secondary | ICD-10-CM | POA: Diagnosis not present

## 2021-09-10 DIAGNOSIS — G4700 Insomnia, unspecified: Secondary | ICD-10-CM | POA: Diagnosis not present

## 2021-09-10 DIAGNOSIS — K219 Gastro-esophageal reflux disease without esophagitis: Secondary | ICD-10-CM | POA: Diagnosis not present

## 2021-09-10 DIAGNOSIS — R7301 Impaired fasting glucose: Secondary | ICD-10-CM | POA: Diagnosis not present

## 2021-09-10 DIAGNOSIS — E785 Hyperlipidemia, unspecified: Secondary | ICD-10-CM | POA: Diagnosis not present

## 2021-09-10 DIAGNOSIS — Z125 Encounter for screening for malignant neoplasm of prostate: Secondary | ICD-10-CM | POA: Diagnosis not present

## 2021-09-10 DIAGNOSIS — B9689 Other specified bacterial agents as the cause of diseases classified elsewhere: Secondary | ICD-10-CM | POA: Diagnosis not present

## 2021-09-10 DIAGNOSIS — F419 Anxiety disorder, unspecified: Secondary | ICD-10-CM | POA: Diagnosis not present

## 2021-09-10 DIAGNOSIS — J208 Acute bronchitis due to other specified organisms: Secondary | ICD-10-CM | POA: Diagnosis not present

## 2021-09-17 LAB — B CELL SUBSET ANALYSIS
Activated CD21low CD38- %: 9.1 % of CD19 — ABNORMAL HIGH (ref 1.2–9.0)
Activated CD21low CD38-: 6 cells/uL (ref 3–26)
CD19+ B cells %: 7 % Lymphs (ref 6.4–22.0)
CD19+ B cells: 71 cells/uL — ABNORMAL LOW (ref 110–450)
CD20+ %: 98.1 % of CD19 (ref 96.0–100.0)
CD20+: 69 cells/uL — ABNORMAL LOW (ref 110–450)
Class-switched CD27+IgD-IgM- %: 12.4 % of CD19 (ref 5.1–22.0)
Class-switched CD27+IgD-IgM-: 9 cells/uL — ABNORMAL LOW (ref 11–61)
Non switched CD27+IgD+IgM+ %: 10 % of CD19 (ref 2.4–15.0)
Non switched CD27+IgD+IgM+: 7 cells/uL (ref 5–46)
Plasmablasts CD38+IgM- %: 0.1 % of CD19 — ABNORMAL LOW (ref 0.4–4.1)
Plasmablasts CD38+IgM-: 0 cells/uL — ABNORMAL LOW (ref 1–8)
Total Memory CD27+ %: 27.3 % of CD19 (ref 10.0–33.0)
Total Memory CD27+: 19 cells/uL — ABNORMAL LOW (ref 23–110)
Transitional CD38+IgM+ %: 0.5 % of CD19 — ABNORMAL LOW (ref 0.7–5.9)
Transitional CD38+IgM+: 0 cells/uL — ABNORMAL LOW (ref 1–17)

## 2021-09-28 DIAGNOSIS — M5127 Other intervertebral disc displacement, lumbosacral region: Secondary | ICD-10-CM | POA: Diagnosis not present

## 2021-10-10 DIAGNOSIS — Z1331 Encounter for screening for depression: Secondary | ICD-10-CM | POA: Diagnosis not present

## 2021-10-10 DIAGNOSIS — E669 Obesity, unspecified: Secondary | ICD-10-CM | POA: Diagnosis not present

## 2021-10-10 DIAGNOSIS — M5127 Other intervertebral disc displacement, lumbosacral region: Secondary | ICD-10-CM | POA: Diagnosis not present

## 2021-10-10 DIAGNOSIS — Z0000 Encounter for general adult medical examination without abnormal findings: Secondary | ICD-10-CM | POA: Diagnosis not present

## 2021-10-10 DIAGNOSIS — E785 Hyperlipidemia, unspecified: Secondary | ICD-10-CM | POA: Diagnosis not present

## 2021-10-10 DIAGNOSIS — Z6837 Body mass index (BMI) 37.0-37.9, adult: Secondary | ICD-10-CM | POA: Diagnosis not present

## 2021-10-10 DIAGNOSIS — M5126 Other intervertebral disc displacement, lumbar region: Secondary | ICD-10-CM | POA: Diagnosis not present

## 2021-10-10 DIAGNOSIS — Z9181 History of falling: Secondary | ICD-10-CM | POA: Diagnosis not present

## 2021-10-12 DIAGNOSIS — M5127 Other intervertebral disc displacement, lumbosacral region: Secondary | ICD-10-CM | POA: Diagnosis not present

## 2021-10-13 ENCOUNTER — Other Ambulatory Visit: Payer: Self-pay | Admitting: Neurological Surgery

## 2021-10-20 DIAGNOSIS — E119 Type 2 diabetes mellitus without complications: Secondary | ICD-10-CM | POA: Diagnosis not present

## 2021-10-20 DIAGNOSIS — Z713 Dietary counseling and surveillance: Secondary | ICD-10-CM | POA: Diagnosis not present

## 2021-11-03 ENCOUNTER — Telehealth: Payer: Self-pay | Admitting: Allergy and Immunology

## 2021-11-03 NOTE — Telephone Encounter (Signed)
Patient states he is scheduled to have back surgery the day of his next injection. He will be in the hospital a couple days after recovering. He is wondering if he should take the injection the day before it is due or if he should do it while in the hospital. He wanted to see what Dr. Neldon Mc recommended.

## 2021-11-03 NOTE — Telephone Encounter (Signed)
Please advise 

## 2021-11-03 NOTE — Telephone Encounter (Signed)
Patient informed of Dr. Bruna Potter message.

## 2021-11-09 DIAGNOSIS — E119 Type 2 diabetes mellitus without complications: Secondary | ICD-10-CM | POA: Diagnosis not present

## 2021-11-09 DIAGNOSIS — Z713 Dietary counseling and surveillance: Secondary | ICD-10-CM | POA: Diagnosis not present

## 2021-11-10 NOTE — Pre-Procedure Instructions (Signed)
Surgical Instructions    Your procedure is scheduled on Tuesday, January 3rd.  Report to [facility] Main Entrance "A" at 05:30 A.M., then check in with the Admitting office.  Call this number if you have problems the morning of surgery:  [phone]   If you have any questions prior to your surgery date call [phone]: Open Monday-Friday 8am-4pm    Remember:  Do not eat after midnight the night before your surgery  You may drink clear liquids until 04:30 AM the morning of your surgery.   Clear liquids allowed are: Water, Non-Citrus Juices (without pulp), Carbonated Beverages, Clear Tea, Black Coffee Only, and Gatorade    Take these medicines the morning of surgery with A SIP OF WATER  ALPRAZolam (XANAX) budesonide (PULMICORT) famotidine (PEPCID)  fluticasone (FLOVENT HFA) omeprazole (PRILOSEC) solifenacin (VESICARE)  SYMBICORT 160-4.5 MCG/ACT inhaler tamsulosin (FLOMAX) venlafaxine XR (EFFEXOR-XR)   If needed: acetaminophen (TYLENOL) albuterol (VENTOLIN HFA)- if needed, bring with you on day of surgery HYDROcodone-acetaminophen (NORCO/VICODIN) triamcinolone (NASACORT) 55 MCG/ACT AERO nasal inhaler   As of today, STOP taking any Aspirin (unless otherwise instructed by your surgeon) Aleve, Naproxen, Ibuprofen, Motrin, Advil, Goody's, BC's, all herbal medications, fish oil, and all vitamins.                     Do NOT Smoke (Tobacco/Vaping) or drink Alcohol 24 hours prior to your procedure.  If you use a CPAP at night, you may bring all equipment for your overnight stay.   Contacts, glasses, piercing's, hearing aid's, dentures or partials may not be worn into surgery, please bring cases for these belongings.    For patients admitted to the hospital, discharge time will be determined by your treatment team.   Patients discharged the day of surgery will not be allowed to drive home, and someone needs to stay with them for 24 hours.  NO VISITORS WILL BE ALLOWED IN PRE-OP  WHERE PATIENTS GET READY FOR SURGERY.  ONLY 1 SUPPORT PERSON MAY BE PRESENT IN THE WAITING ROOM WHILE YOU ARE IN SURGERY.  IF YOU ARE TO BE ADMITTED, ONCE YOU ARE IN YOUR ROOM YOU WILL BE ALLOWED TWO (2) VISITORS.  Minor children may have two parents present. Special consideration for safety and communication needs will be reviewed on a case by case basis.   Special instructions:   [place]- Preparing For Surgery  Before surgery, you can play an important role. Because skin is not sterile, your skin needs to be as free of germs as possible. You can reduce the number of germs on your skin by washing with CHG (chlorahexidine gluconate) Soap before surgery.  CHG is an antiseptic cleaner which kills germs and bonds with the skin to continue killing germs even after washing.    Oral Hygiene is also important to reduce your risk of infection.  Remember - BRUSH YOUR TEETH THE MORNING OF SURGERY WITH YOUR REGULAR TOOTHPASTE  Please do not use if you have an allergy to CHG or antibacterial soaps. If your skin becomes reddened/irritated stop using the CHG.  Do not shave (including legs and underarms) for at least 48 hours prior to first CHG shower. It is OK to shave your face.  Please follow these instructions carefully.   Shower the NIGHT BEFORE SURGERY and the MORNING OF SURGERY  If you chose to wash your hair, wash your hair first as usual with your normal shampoo.  After you shampoo, rinse your hair and body thoroughly to remove  the shampoo.  Use CHG Soap as you would any other liquid soap. You can apply CHG directly to the skin and wash gently with a scrungie or a clean washcloth.   Apply the CHG Soap to your body ONLY FROM THE NECK DOWN.  Do not use on open wounds or open sores. Avoid contact with your eyes, ears, mouth and genitals (private parts). Wash Face and genitals (private parts)  with your normal soap.   Wash thoroughly, paying special attention to the area where your surgery will be  performed.  Thoroughly rinse your body with warm water from the neck down.  DO NOT shower/wash with your normal soap after using and rinsing off the CHG Soap.  Pat yourself dry with a CLEAN TOWEL.  Wear CLEAN PAJAMAS to bed the night before surgery  Place CLEAN SHEETS on your bed the night before your surgery  DO NOT SLEEP WITH PETS.   Day of Surgery: Shower with CHG soap. Do not wear jewelry Do not wear lotions, powders, colognes, or deodorant. Men may shave face and neck. Do not bring valuables to the hospital. [facility] is not responsible for any belongings or valuables. Wear Clean/Comfortable clothing the morning of surgery Remember to brush your teeth WITH YOUR REGULAR TOOTHPASTE.   Please read over the following fact sheets that you were given.   3 days prior to your procedure or After your COVID test   You are not required to quarantine however you are required to wear a well-fitting mask when you are out and around people not in your household. If your mask becomes wet or soiled, replace with a new one.   Wash your hands often with soap and water for 20 seconds or clean your hands with an alcohol-based hand sanitizer that contains at least 60% alcohol.   Do not share personal items.   Notify your provider:  o if you are in close contact with someone who has COVID  o or if you develop a fever of 100.4 or greater, sneezing, cough, sore throat, shortness of breath or body aches.

## 2021-11-11 ENCOUNTER — Other Ambulatory Visit: Payer: Self-pay

## 2021-11-11 ENCOUNTER — Encounter (HOSPITAL_COMMUNITY)
Admission: RE | Admit: 2021-11-11 | Discharge: 2021-11-11 | Disposition: A | Discharge status: Home / Self Care | Payer: Medicare HMO | Source: Ambulatory Visit | Attending: Neurological Surgery | Admitting: Neurological Surgery

## 2021-11-11 ENCOUNTER — Encounter (HOSPITAL_COMMUNITY): Payer: Self-pay

## 2021-11-11 VITALS — BP 120/80 | HR 68 | Temp 98.4°F | Resp 17 | Ht 71.0 in | Wt 275.0 lb

## 2021-11-11 DIAGNOSIS — Z01812 Encounter for preprocedural laboratory examination: Secondary | ICD-10-CM | POA: Diagnosis not present

## 2021-11-11 DIAGNOSIS — Z20822 Contact with and (suspected) exposure to covid-19: Secondary | ICD-10-CM | POA: Insufficient documentation

## 2021-11-11 DIAGNOSIS — E119 Type 2 diabetes mellitus without complications: Secondary | ICD-10-CM | POA: Diagnosis not present

## 2021-11-11 DIAGNOSIS — Z01818 Encounter for other preprocedural examination: Secondary | ICD-10-CM

## 2021-11-11 DIAGNOSIS — R001 Bradycardia, unspecified: Secondary | ICD-10-CM | POA: Diagnosis not present

## 2021-11-11 HISTORY — DX: Attention-deficit hyperactivity disorder, unspecified type: F90.9

## 2021-11-11 LAB — SURGICAL PCR SCREEN
MRSA, PCR: NEGATIVE
Staphylococcus aureus: NEGATIVE

## 2021-11-11 LAB — BASIC METABOLIC PANEL
Anion gap: 7 (ref 5–15)
BUN: 12 mg/dL (ref 8–23)
CO2: 28 mmol/L (ref 22–32)
Calcium: 9.3 mg/dL (ref 8.9–10.3)
Chloride: 106 mmol/L (ref 98–111)
Creatinine, Ser: 1.02 mg/dL (ref 0.61–1.24)
GFR, Estimated: >60 mL/min (ref >60)
Glucose, Bld: 111 mg/dL — ABNORMAL HIGH (ref 70–99)
Potassium: 4.3 mmol/L (ref 3.5–5.1)
Sodium: 141 mmol/L (ref 135–145)

## 2021-11-11 LAB — CBC
HCT: 45 % (ref 39.0–52.0)
Hemoglobin: 14.5 g/dL (ref 13.0–17.0)
MCH: 31.5 pg (ref 26.0–34.0)
MCHC: 32.2 g/dL (ref 30.0–36.0)
MCV: 97.6 fL (ref 80.0–100.0)
Platelets: 216 10*3/uL (ref 150–400)
RBC: 4.61 MIL/uL (ref 4.22–5.81)
RDW: 13.2 % (ref 11.5–15.5)
WBC: 8.3 10*3/uL (ref 4.0–10.5)
nRBC: 0 % (ref 0.0–0.2)

## 2021-11-11 LAB — TYPE AND SCREEN
ABO/RH(D): O NEG
Antibody Screen: NEGATIVE

## 2021-11-11 LAB — SARS CORONAVIRUS 2 (TAT 6-24 HRS): SARS Coronavirus 2: NEGATIVE

## 2021-11-11 LAB — HEMOGLOBIN A1C
Hgb A1c MFr Bld: 6.1 % — ABNORMAL HIGH (ref 4.8–5.6)
Mean Plasma Glucose: 128.37 mg/dL

## 2021-11-11 LAB — GLUCOSE, CAPILLARY: Glucose-Capillary: 137 mg/dL — ABNORMAL HIGH (ref 70–99)

## 2021-11-11 NOTE — Progress Notes (Signed)
PCP - Dr. Nelda Bucks Cardiologist - Pt saw Dr. Shirlee More in 2021 due to bradycardia issues during hospitalization for diverticulitis, but everything checked out okay. No follow-up scheduled.  PPM/ICD - denies  Chest x-ray - 09/03/21 EKG - 07/02/20 Stress Test - denies ECHO - denies Cardiac Cath - pt thinks he had one in 2000 and says everything was "good"  Sleep Study - 07/01/18, not diagnosed with OSA   DM- pt says he was told he is Type 2 Diabetic but was never given a glucometer to check his CBG at home  Blood Thinner Instructions: n/a Aspirin Instructions: n/a  ERAS Protcol - yes, no drink   COVID TEST- 11/11/21 at PAT   Anesthesia review: no, James, PA, informed of bradycardia episode but no need to send for review.  Patient denies shortness of breath, fever, cough and chest pain at PAT appointment   All instructions explained to the patient, with a verbal understanding of the material. Patient agrees to go over the instructions while at home for a better understanding. Patient also instructed to wear a mask in public after being tested for COVID-19. The opportunity to ask questions was provided.

## 2021-11-13 HISTORY — PX: OTHER SURGICAL HISTORY: SHX169

## 2021-11-13 HISTORY — PX: HERNIA REPAIR: SHX51

## 2021-11-14 NOTE — Anesthesia Preprocedure Evaluation (Addendum)
Anesthesia Evaluation  Patient identified by MRN, date of birth, ID band Patient awake    Reviewed: Allergy & Precautions, NPO status , Patient's Chart, lab work & pertinent test results  History of Anesthesia Complications (+) Family history of anesthesia reaction  Airway Mallampati: II  TM Distance: >3 FB Neck ROM: Limited    Dental  (+) Dental Advisory Given, Edentulous Lower, Edentulous Upper   Pulmonary asthma , sleep apnea , COPD,  COPD inhaler, former smoker,    Pulmonary exam normal breath sounds clear to auscultation       Cardiovascular (-) hypertension(-) anginanegative cardio ROS Normal cardiovascular exam Rhythm:Regular Rate:Normal     Neuro/Psych  Headaches, PSYCHIATRIC DISORDERS Anxiety Depression Herniated nucleus pulposus of lumbosacral region    GI/Hepatic Neg liver ROS, GERD  Medicated and Controlled,  Endo/Other  diabetes, Well Controlled, Type 2Obesity   Renal/GU negative Renal ROS     Musculoskeletal  (+) Arthritis ,   Abdominal   Peds  (+) ADHD Hematology negative hematology ROS (+)   Anesthesia Other Findings   Reproductive/Obstetrics                            Anesthesia Physical Anesthesia Plan  ASA: 2  Anesthesia Plan: General   Post-op Pain Management: Tylenol PO (pre-op)   Induction: Intravenous  PONV Risk Score and Plan: 3 and Midazolam, Dexamethasone and Ondansetron  Airway Management Planned: Oral ETT and Video Laryngoscope Planned  Additional Equipment:   Intra-op Plan:   Post-operative Plan: Extubation in OR  Informed Consent: I have reviewed the patients History and Physical, chart, labs and discussed the procedure including the risks, benefits and alternatives for the proposed anesthesia with the patient or authorized representative who has indicated his/her understanding and acceptance.     Dental advisory given  Plan Discussed  with: CRNA  Anesthesia Plan Comments: (2nd PIV after induction)      Anesthesia Quick Evaluation

## 2021-11-15 ENCOUNTER — Inpatient Hospital Stay (HOSPITAL_COMMUNITY): Payer: Medicare HMO

## 2021-11-15 ENCOUNTER — Inpatient Hospital Stay (HOSPITAL_COMMUNITY): Payer: Medicare HMO | Admitting: Physician Assistant

## 2021-11-15 ENCOUNTER — Encounter (HOSPITAL_COMMUNITY): Payer: Self-pay | Admitting: Neurological Surgery

## 2021-11-15 ENCOUNTER — Encounter (HOSPITAL_COMMUNITY)
Admission: RE | Disposition: A | Discharge status: Home Health | Payer: Self-pay | Source: Home / Self Care | Attending: Neurological Surgery

## 2021-11-15 ENCOUNTER — Inpatient Hospital Stay (HOSPITAL_COMMUNITY)
Admission: RE | Admit: 2021-11-15 | Discharge: 2021-11-17 | DRG: 460 | Disposition: A | Discharge status: Home Health | Payer: Medicare HMO | Attending: Neurological Surgery | Admitting: Neurological Surgery

## 2021-11-15 DIAGNOSIS — Z823 Family history of stroke: Secondary | ICD-10-CM | POA: Diagnosis not present

## 2021-11-15 DIAGNOSIS — M5116 Intervertebral disc disorders with radiculopathy, lumbar region: Secondary | ICD-10-CM | POA: Diagnosis not present

## 2021-11-15 DIAGNOSIS — Z7951 Long term (current) use of inhaled steroids: Secondary | ICD-10-CM | POA: Diagnosis not present

## 2021-11-15 DIAGNOSIS — E119 Type 2 diabetes mellitus without complications: Secondary | ICD-10-CM | POA: Diagnosis not present

## 2021-11-15 DIAGNOSIS — K219 Gastro-esophageal reflux disease without esophagitis: Secondary | ICD-10-CM | POA: Diagnosis present

## 2021-11-15 DIAGNOSIS — G4733 Obstructive sleep apnea (adult) (pediatric): Secondary | ICD-10-CM | POA: Diagnosis not present

## 2021-11-15 DIAGNOSIS — Z87891 Personal history of nicotine dependence: Secondary | ICD-10-CM

## 2021-11-15 DIAGNOSIS — Z9889 Other specified postprocedural states: Secondary | ICD-10-CM | POA: Diagnosis not present

## 2021-11-15 DIAGNOSIS — J449 Chronic obstructive pulmonary disease, unspecified: Secondary | ICD-10-CM | POA: Diagnosis present

## 2021-11-15 DIAGNOSIS — M4327 Fusion of spine, lumbosacral region: Secondary | ICD-10-CM | POA: Diagnosis not present

## 2021-11-15 DIAGNOSIS — F909 Attention-deficit hyperactivity disorder, unspecified type: Secondary | ICD-10-CM | POA: Diagnosis present

## 2021-11-15 DIAGNOSIS — Z79899 Other long term (current) drug therapy: Secondary | ICD-10-CM | POA: Diagnosis not present

## 2021-11-15 DIAGNOSIS — Z6838 Body mass index (BMI) 38.0-38.9, adult: Secondary | ICD-10-CM | POA: Diagnosis not present

## 2021-11-15 DIAGNOSIS — M48061 Spinal stenosis, lumbar region without neurogenic claudication: Secondary | ICD-10-CM | POA: Diagnosis not present

## 2021-11-15 DIAGNOSIS — Z419 Encounter for procedure for purposes other than remedying health state, unspecified: Secondary | ICD-10-CM

## 2021-11-15 DIAGNOSIS — Z8249 Family history of ischemic heart disease and other diseases of the circulatory system: Secondary | ICD-10-CM

## 2021-11-15 DIAGNOSIS — F419 Anxiety disorder, unspecified: Secondary | ICD-10-CM | POA: Diagnosis present

## 2021-11-15 DIAGNOSIS — M5126 Other intervertebral disc displacement, lumbar region: Secondary | ICD-10-CM | POA: Diagnosis present

## 2021-11-15 LAB — GLUCOSE, CAPILLARY
Glucose-Capillary: 115 mg/dL — ABNORMAL HIGH (ref 70–99)
Glucose-Capillary: 169 mg/dL — ABNORMAL HIGH (ref 70–99)
Glucose-Capillary: 223 mg/dL — ABNORMAL HIGH (ref 70–99)
Glucose-Capillary: 156 mg/dL — ABNORMAL HIGH (ref 70–99)

## 2021-11-15 SURGERY — POSTERIOR LUMBAR FUSION 1 LEVEL
Anesthesia: General | Site: Spine Lumbar

## 2021-11-15 MED ORDER — ALBUTEROL SULFATE (2.5 MG/3ML) 0.083% IN NEBU: 2.5000 mg | INHALATION_SOLUTION | Freq: Four times a day (QID) | RESPIRATORY_TRACT | Status: DC | PRN

## 2021-11-15 MED ORDER — CEFAZOLIN SODIUM-DEXTROSE 2-4 GM/100ML-% IV SOLN
2.0000 g | Freq: Three times a day (TID) | INTRAVENOUS | Status: AC
  Administered 2021-11-15 (×2): 2 g via INTRAVENOUS
  Filled 2021-11-15 (×2): qty 100

## 2021-11-15 MED ORDER — MIDAZOLAM HCL 2 MG/2ML IJ SOLN
INTRAMUSCULAR | Status: DC | PRN
Start: 2021-11-15 — End: 2021-11-15
  Administered 2021-11-15: 2 mg via INTRAVENOUS

## 2021-11-15 MED ORDER — CHLORHEXIDINE GLUCONATE 0.12 % MT SOLN: 15.0000 mL | Freq: Once | OROMUCOSAL | Status: AC

## 2021-11-15 MED ORDER — BUPIVACAINE HCL (PF) 0.5 % IJ SOLN
INTRAMUSCULAR | Status: DC | PRN
Start: 2021-11-15 — End: 2021-11-15
  Administered 2021-11-15: 25 mL
  Administered 2021-11-15: 5 mL

## 2021-11-15 MED ORDER — MENTHOL 3 MG MT LOZG: 1.0000 | LOZENGE | OROMUCOSAL | Status: DC | PRN

## 2021-11-15 MED ORDER — FAMOTIDINE 20 MG PO TABS
40.0000 mg | ORAL_TABLET | Freq: Two times a day (BID) | ORAL | Status: DC
  Administered 2021-11-15 – 2021-11-17 (×3): 40 mg via ORAL
  Filled 2021-11-15 (×3): qty 2

## 2021-11-15 MED ORDER — THROMBIN 20000 UNITS EX SOLR: CUTANEOUS | Status: DC | PRN

## 2021-11-15 MED ORDER — LACTATED RINGERS IV SOLN: INTRAVENOUS | Status: DC

## 2021-11-15 MED ORDER — ALPRAZOLAM 0.5 MG PO TABS
1.0000 mg | ORAL_TABLET | Freq: Two times a day (BID) | ORAL | Status: DC
  Administered 2021-11-15 – 2021-11-17 (×5): 1 mg via ORAL
  Filled 2021-11-15 (×5): qty 2

## 2021-11-15 MED ORDER — CEFAZOLIN SODIUM-DEXTROSE 2-4 GM/100ML-% IV SOLN
INTRAVENOUS | Status: AC
  Filled 2021-11-15: qty 100

## 2021-11-15 MED ORDER — CEFAZOLIN SODIUM 1 G IJ SOLR
INTRAMUSCULAR | Status: AC
  Filled 2021-11-15: qty 10

## 2021-11-15 MED ORDER — BUPIVACAINE HCL (PF) 0.5 % IJ SOLN
INTRAMUSCULAR | Status: AC
  Filled 2021-11-15: qty 30

## 2021-11-15 MED ORDER — RISAQUAD PO CAPS
1.0000 | ORAL_CAPSULE | Freq: Every day | ORAL | Status: DC
  Administered 2021-11-15 – 2021-11-17 (×3): 1 via ORAL
  Filled 2021-11-15 (×3): qty 1

## 2021-11-15 MED ORDER — LIDOCAINE-EPINEPHRINE 1 %-1:100000 IJ SOLN
INTRAMUSCULAR | Status: DC | PRN
  Administered 2021-11-15: 5 mL

## 2021-11-15 MED ORDER — BUDESONIDE 0.5 MG/2ML IN SUSP
0.5000 mg | Freq: Two times a day (BID) | RESPIRATORY_TRACT | Status: DC
  Administered 2021-11-15: 0.5 mg via RESPIRATORY_TRACT
  Filled 2021-11-15 (×5): qty 2

## 2021-11-15 MED ORDER — MELATONIN 5 MG PO TABS
20.0000 mg | ORAL_TABLET | Freq: Every day | ORAL | Status: DC
  Administered 2021-11-15: 20 mg via ORAL
  Filled 2021-11-15: qty 4

## 2021-11-15 MED ORDER — TRIAMCINOLONE ACETONIDE 55 MCG/ACT NA AERO: 1.0000 | INHALATION_SPRAY | Freq: Every day | NASAL | Status: DC | PRN

## 2021-11-15 MED ORDER — ROCURONIUM BROMIDE 10 MG/ML (PF) SYRINGE
PREFILLED_SYRINGE | INTRAVENOUS | Status: AC
  Filled 2021-11-15: qty 10

## 2021-11-15 MED ORDER — CHLORHEXIDINE GLUCONATE CLOTH 2 % EX PADS: 6.0000 | MEDICATED_PAD | Freq: Once | CUTANEOUS | Status: DC

## 2021-11-15 MED ORDER — THROMBIN 5000 UNITS EX SOLR
CUTANEOUS | Status: AC
  Filled 2021-11-15: qty 5000

## 2021-11-15 MED ORDER — PHENYLEPHRINE 40 MCG/ML (10ML) SYRINGE FOR IV PUSH (FOR BLOOD PRESSURE SUPPORT)
PREFILLED_SYRINGE | INTRAVENOUS | Status: DC | PRN
  Administered 2021-11-15 (×2): 80 ug via INTRAVENOUS
  Administered 2021-11-15: 120 ug via INTRAVENOUS

## 2021-11-15 MED ORDER — CHLORHEXIDINE GLUCONATE 0.12 % MT SOLN
OROMUCOSAL | Status: AC
  Administered 2021-11-15: 15 mL via OROMUCOSAL
  Filled 2021-11-15: qty 15

## 2021-11-15 MED ORDER — ORAL CARE MOUTH RINSE: 15.0000 mL | Freq: Once | OROMUCOSAL | Status: AC

## 2021-11-15 MED ORDER — DEXAMETHASONE SODIUM PHOSPHATE 10 MG/ML IJ SOLN
INTRAMUSCULAR | Status: AC
  Filled 2021-11-15: qty 1

## 2021-11-15 MED ORDER — FENTANYL CITRATE (PF) 100 MCG/2ML IJ SOLN
INTRAMUSCULAR | Status: AC
  Filled 2021-11-15: qty 2

## 2021-11-15 MED ORDER — ONDANSETRON HCL 4 MG PO TABS: 4.0000 mg | ORAL_TABLET | Freq: Four times a day (QID) | ORAL | Status: DC | PRN

## 2021-11-15 MED ORDER — ONDANSETRON HCL 4 MG/2ML IJ SOLN
INTRAMUSCULAR | Status: AC
  Filled 2021-11-15: qty 2

## 2021-11-15 MED ORDER — THROMBIN 5000 UNITS EX SOLR: OROMUCOSAL | Status: DC | PRN

## 2021-11-15 MED ORDER — 0.9 % SODIUM CHLORIDE (POUR BTL) OPTIME
TOPICAL | Status: DC | PRN
  Administered 2021-11-15: 1000 mL

## 2021-11-15 MED ORDER — SODIUM CHLORIDE 0.9% FLUSH
3.0000 mL | Freq: Two times a day (BID) | INTRAVENOUS | Status: DC
  Administered 2021-11-15 (×2): 3 mL via INTRAVENOUS

## 2021-11-15 MED ORDER — PHENYLEPHRINE HCL-NACL 20-0.9 MG/250ML-% IV SOLN
INTRAVENOUS | Status: DC | PRN
  Administered 2021-11-15: 50 ug/min via INTRAVENOUS

## 2021-11-15 MED ORDER — PHENOL 1.4 % MT LIQD: 1.0000 | OROMUCOSAL | Status: DC | PRN

## 2021-11-15 MED ORDER — THROMBIN 20000 UNITS EX SOLR
CUTANEOUS | Status: AC
  Filled 2021-11-15: qty 20000

## 2021-11-15 MED ORDER — SODIUM CHLORIDE 0.9 % IV SOLN: 250.0000 mL | INTRAVENOUS | Status: DC

## 2021-11-15 MED ORDER — FENTANYL CITRATE (PF) 250 MCG/5ML IJ SOLN
INTRAMUSCULAR | Status: AC
  Filled 2021-11-15: qty 5

## 2021-11-15 MED ORDER — MIDAZOLAM HCL 2 MG/2ML IJ SOLN
INTRAMUSCULAR | Status: AC
  Filled 2021-11-15: qty 2

## 2021-11-15 MED ORDER — BISACODYL 10 MG RE SUPP: 10.0000 mg | Freq: Every day | RECTAL | Status: DC | PRN

## 2021-11-15 MED ORDER — POLYETHYLENE GLYCOL 3350 17 G PO PACK: 17.0000 g | PACK | Freq: Every day | ORAL | Status: DC | PRN

## 2021-11-15 MED ORDER — HYDROMORPHONE HCL 1 MG/ML IJ SOLN
INTRAMUSCULAR | Status: DC | PRN
  Administered 2021-11-15: 1 mg via INTRAVENOUS

## 2021-11-15 MED ORDER — LIDOCAINE 2% (20 MG/ML) 5 ML SYRINGE
INTRAMUSCULAR | Status: DC | PRN
Start: 2021-11-15 — End: 2021-11-15
  Administered 2021-11-15: 100 mg via INTRAVENOUS

## 2021-11-15 MED ORDER — METHOCARBAMOL 500 MG PO TABS
500.0000 mg | ORAL_TABLET | Freq: Four times a day (QID) | ORAL | Status: DC | PRN
  Administered 2021-11-15 – 2021-11-17 (×5): 500 mg via ORAL
  Filled 2021-11-15 (×5): qty 1

## 2021-11-15 MED ORDER — FLUTICASONE FUROATE-VILANTEROL 200-25 MCG/ACT IN AEPB
1.0000 | INHALATION_SPRAY | Freq: Every day | RESPIRATORY_TRACT | Status: DC
  Administered 2021-11-16: 1 via RESPIRATORY_TRACT
  Filled 2021-11-15: qty 28

## 2021-11-15 MED ORDER — LIDOCAINE 2% (20 MG/ML) 5 ML SYRINGE
INTRAMUSCULAR | Status: AC
  Filled 2021-11-15: qty 5

## 2021-11-15 MED ORDER — ALUM & MAG HYDROXIDE-SIMETH 200-200-20 MG/5ML PO SUSP: 30.0000 mL | Freq: Four times a day (QID) | ORAL | Status: DC | PRN

## 2021-11-15 MED ORDER — INSULIN ASPART 100 UNIT/ML IJ SOLN
0.0000 [IU] | Freq: Three times a day (TID) | INTRAMUSCULAR | Status: DC
  Administered 2021-11-15: 7 [IU] via SUBCUTANEOUS
  Administered 2021-11-16 (×3): 4 [IU] via SUBCUTANEOUS

## 2021-11-15 MED ORDER — CEFAZOLIN IN SODIUM CHLORIDE 3-0.9 GM/100ML-% IV SOLN
3.0000 g | INTRAVENOUS | Status: AC
  Administered 2021-11-15: 3 g via INTRAVENOUS

## 2021-11-15 MED ORDER — OXYCODONE-ACETAMINOPHEN 5-325 MG PO TABS
1.0000 | ORAL_TABLET | ORAL | Status: DC | PRN
  Administered 2021-11-15 – 2021-11-17 (×8): 2 via ORAL
  Administered 2021-11-17: 1 via ORAL
  Administered 2021-11-17: 2 via ORAL
  Filled 2021-11-15 (×10): qty 2

## 2021-11-15 MED ORDER — ACETAMINOPHEN 500 MG PO TABS
ORAL_TABLET | ORAL | Status: AC
  Administered 2021-11-15: 1000 mg via ORAL
  Filled 2021-11-15: qty 2

## 2021-11-15 MED ORDER — PROPOFOL 10 MG/ML IV BOLUS
INTRAVENOUS | Status: DC | PRN
Start: 2021-11-15 — End: 2021-11-15
  Administered 2021-11-15: 150 mg via INTRAVENOUS

## 2021-11-15 MED ORDER — ONDANSETRON HCL 4 MG/2ML IJ SOLN: 4.0000 mg | Freq: Four times a day (QID) | INTRAMUSCULAR | Status: DC | PRN

## 2021-11-15 MED ORDER — FLEET ENEMA 7-19 GM/118ML RE ENEM: 1.0000 | ENEMA | Freq: Once | RECTAL | Status: DC | PRN

## 2021-11-15 MED ORDER — LIDOCAINE-EPINEPHRINE 1 %-1:100000 IJ SOLN
INTRAMUSCULAR | Status: AC
  Filled 2021-11-15: qty 1

## 2021-11-15 MED ORDER — DOCUSATE SODIUM 100 MG PO CAPS
100.0000 mg | ORAL_CAPSULE | Freq: Two times a day (BID) | ORAL | Status: DC
  Administered 2021-11-15 – 2021-11-17 (×5): 100 mg via ORAL
  Filled 2021-11-15 (×5): qty 1

## 2021-11-15 MED ORDER — HYDROMORPHONE HCL 1 MG/ML IJ SOLN
INTRAMUSCULAR | Status: AC
  Filled 2021-11-15: qty 0.5

## 2021-11-15 MED ORDER — TAMSULOSIN HCL 0.4 MG PO CAPS
0.4000 mg | ORAL_CAPSULE | Freq: Two times a day (BID) | ORAL | Status: DC
  Administered 2021-11-15 – 2021-11-17 (×4): 0.4 mg via ORAL
  Filled 2021-11-15 (×4): qty 1

## 2021-11-15 MED ORDER — SODIUM CHLORIDE 0.9% FLUSH: 3.0000 mL | INTRAVENOUS | Status: DC | PRN

## 2021-11-15 MED ORDER — FENTANYL CITRATE (PF) 250 MCG/5ML IJ SOLN
INTRAMUSCULAR | Status: DC | PRN
Start: 2021-11-15 — End: 2021-11-15
  Administered 2021-11-15: 50 ug via INTRAVENOUS
  Administered 2021-11-15: 100 ug via INTRAVENOUS
  Administered 2021-11-15 (×2): 50 ug via INTRAVENOUS

## 2021-11-15 MED ORDER — PHENYLEPHRINE 40 MCG/ML (10ML) SYRINGE FOR IV PUSH (FOR BLOOD PRESSURE SUPPORT)
PREFILLED_SYRINGE | INTRAVENOUS | Status: AC
  Filled 2021-11-15: qty 10

## 2021-11-15 MED ORDER — CALCIUM POLYCARBOPHIL 625 MG PO TABS
625.0000 mg | ORAL_TABLET | Freq: Every day | ORAL | Status: DC
  Administered 2021-11-16 – 2021-11-17 (×2): 625 mg via ORAL
  Filled 2021-11-15 (×2): qty 1

## 2021-11-15 MED ORDER — HYDROCODONE-ACETAMINOPHEN 5-325 MG PO TABS: 1.0000 | ORAL_TABLET | ORAL | Status: DC | PRN

## 2021-11-15 MED ORDER — ZOLPIDEM TARTRATE 5 MG PO TABS
5.0000 mg | ORAL_TABLET | Freq: Every day | ORAL | Status: DC
  Administered 2021-11-15 – 2021-11-16 (×2): 5 mg via ORAL
  Filled 2021-11-15 (×2): qty 1

## 2021-11-15 MED ORDER — DARIFENACIN HYDROBROMIDE ER 15 MG PO TB24
15.0000 mg | ORAL_TABLET | Freq: Every day | ORAL | Status: DC
  Administered 2021-11-16 – 2021-11-17 (×2): 15 mg via ORAL
  Filled 2021-11-15 (×2): qty 1

## 2021-11-15 MED ORDER — PANTOPRAZOLE SODIUM 40 MG PO TBEC
40.0000 mg | DELAYED_RELEASE_TABLET | Freq: Every day | ORAL | Status: DC
  Administered 2021-11-16 – 2021-11-17 (×2): 40 mg via ORAL
  Filled 2021-11-15 (×2): qty 1

## 2021-11-15 MED ORDER — PROMETHAZINE HCL 25 MG/ML IJ SOLN: 6.2500 mg | INTRAMUSCULAR | Status: DC | PRN

## 2021-11-15 MED ORDER — METHOCARBAMOL 1000 MG/10ML IJ SOLN
500.0000 mg | Freq: Four times a day (QID) | INTRAVENOUS | Status: DC | PRN
  Filled 2021-11-15: qty 5

## 2021-11-15 MED ORDER — DEXAMETHASONE SODIUM PHOSPHATE 10 MG/ML IJ SOLN
INTRAMUSCULAR | Status: DC | PRN
Start: 2021-11-15 — End: 2021-11-15
  Administered 2021-11-15: 10 mg via INTRAVENOUS

## 2021-11-15 MED ORDER — SUGAMMADEX SODIUM 200 MG/2ML IV SOLN
INTRAVENOUS | Status: DC | PRN
  Administered 2021-11-15: 200 mg via INTRAVENOUS

## 2021-11-15 MED ORDER — METOCLOPRAMIDE HCL 5 MG PO TABS
5.0000 mg | ORAL_TABLET | Freq: Every day | ORAL | Status: DC
  Administered 2021-11-15 – 2021-11-16 (×2): 5 mg via ORAL
  Filled 2021-11-15 (×2): qty 1

## 2021-11-15 MED ORDER — SENNA 8.6 MG PO TABS
1.0000 | ORAL_TABLET | Freq: Two times a day (BID) | ORAL | Status: DC
  Administered 2021-11-15 – 2021-11-17 (×5): 8.6 mg via ORAL
  Filled 2021-11-15 (×5): qty 1

## 2021-11-15 MED ORDER — ONDANSETRON HCL 4 MG/2ML IJ SOLN
INTRAMUSCULAR | Status: DC | PRN
  Administered 2021-11-15: 4 mg via INTRAVENOUS

## 2021-11-15 MED ORDER — ROCURONIUM BROMIDE 10 MG/ML (PF) SYRINGE
PREFILLED_SYRINGE | INTRAVENOUS | Status: DC | PRN
  Administered 2021-11-15 (×2): 20 mg via INTRAVENOUS
  Administered 2021-11-15: 70 mg via INTRAVENOUS
  Administered 2021-11-15 (×3): 20 mg via INTRAVENOUS

## 2021-11-15 MED ORDER — MORPHINE SULFATE (PF) 2 MG/ML IV SOLN
2.0000 mg | INTRAVENOUS | Status: DC | PRN
  Administered 2021-11-15: 4 mg via INTRAVENOUS
  Filled 2021-11-15: qty 2

## 2021-11-15 MED ORDER — PROPOFOL 10 MG/ML IV BOLUS
INTRAVENOUS | Status: AC
  Filled 2021-11-15: qty 20

## 2021-11-15 MED ORDER — ACETAMINOPHEN 650 MG RE SUPP: 650.0000 mg | RECTAL | Status: DC | PRN

## 2021-11-15 MED ORDER — ACETAMINOPHEN 500 MG PO TABS: 1000.0000 mg | ORAL_TABLET | Freq: Once | ORAL | Status: AC

## 2021-11-15 MED ORDER — ACETAMINOPHEN 325 MG PO TABS: 650.0000 mg | ORAL_TABLET | ORAL | Status: DC | PRN

## 2021-11-15 MED ORDER — BUDESONIDE 0.5 MG/2ML IN SUSP
1.0000 mg | Freq: Two times a day (BID) | RESPIRATORY_TRACT | Status: DC
  Administered 2021-11-16 – 2021-11-17 (×3): 1 mg via RESPIRATORY_TRACT
  Filled 2021-11-15 (×7): qty 4

## 2021-11-15 MED ORDER — VENLAFAXINE HCL ER 75 MG PO CP24
75.0000 mg | ORAL_CAPSULE | Freq: Every day | ORAL | Status: DC
  Administered 2021-11-16 – 2021-11-17 (×2): 75 mg via ORAL
  Filled 2021-11-15 (×3): qty 1

## 2021-11-15 MED ORDER — FENTANYL CITRATE (PF) 100 MCG/2ML IJ SOLN
25.0000 ug | INTRAMUSCULAR | Status: DC | PRN
  Administered 2021-11-15 (×2): 25 ug via INTRAVENOUS

## 2021-11-15 SURGICAL SUPPLY — 68 items
ADH SKN CLS APL DERMABOND .7 (GAUZE/BANDAGES/DRESSINGS) ×1
APL SRG 60D 8 XTD TIP BNDBL (TIP)
BAG COUNTER SPONGE SURGICOUNT (BAG) ×3 IMPLANT
BAG SPNG CNTER NS LX DISP (BAG) ×1
BAG SURGICOUNT SPONGE COUNTING (BAG) ×1
BASKET BONE COLLECTION (BASKET) ×4 IMPLANT
BLADE CLIPPER SURG (BLADE) IMPLANT
BONE CANC CHIPS 20CC PCAN1/4 (Bone Implant) ×3 IMPLANT
BUR MATCHSTICK NEURO 3.0 LAGG (BURR) ×4 IMPLANT
CANISTER SUCT 3000ML PPV (MISCELLANEOUS) ×4 IMPLANT
CHIPS CANC BONE 20CC PCAN1/4 (Bone Implant) ×1 IMPLANT
CNTNR URN SCR LID CUP LEK RST (MISCELLANEOUS) ×2 IMPLANT
CONT SPEC 4OZ STRL OR WHT (MISCELLANEOUS) ×6
COVER BACK TABLE 60X90IN (DRAPES) ×4 IMPLANT
DECANTER SPIKE VIAL GLASS SM (MISCELLANEOUS) ×2 IMPLANT
DERMABOND ADVANCED (GAUZE/BANDAGES/DRESSINGS) ×2
DERMABOND ADVANCED .7 DNX12 (GAUZE/BANDAGES/DRESSINGS) ×2 IMPLANT
DEVICE DISSECT PLASMABLAD 3.0S (MISCELLANEOUS) ×2 IMPLANT
DRAPE C-ARM 42X72 X-RAY (DRAPES) ×8 IMPLANT
DRAPE HALF SHEET 40X57 (DRAPES) IMPLANT
DRAPE LAPAROTOMY 100X72X124 (DRAPES) ×4 IMPLANT
DURAPREP 26ML APPLICATOR (WOUND CARE) ×4 IMPLANT
DURASEAL APPLICATOR TIP (TIP) IMPLANT
DURASEAL SPINE SEALANT 3ML (MISCELLANEOUS) IMPLANT
ELECT REM PT RETURN 9FT ADLT (ELECTROSURGICAL) ×3
ELECTRODE REM PT RTRN 9FT ADLT (ELECTROSURGICAL) ×2 IMPLANT
GAUZE 4X4 16PLY ~~LOC~~+RFID DBL (SPONGE) IMPLANT
GAUZE SPONGE 4X4 12PLY STRL (GAUZE/BANDAGES/DRESSINGS) ×4 IMPLANT
GLOVE SURG LTX SZ7 (GLOVE) ×2 IMPLANT
GLOVE SURG LTX SZ8.5 (GLOVE) ×8 IMPLANT
GLOVE SURG UNDER POLY LF SZ7.5 (GLOVE) ×2 IMPLANT
GLOVE SURG UNDER POLY LF SZ8.5 (GLOVE) ×8 IMPLANT
GOWN STRL REUS W/ TWL LRG LVL3 (GOWN DISPOSABLE) IMPLANT
GOWN STRL REUS W/ TWL XL LVL3 (GOWN DISPOSABLE) IMPLANT
GOWN STRL REUS W/TWL 2XL LVL3 (GOWN DISPOSABLE) ×6 IMPLANT
GOWN STRL REUS W/TWL LRG LVL3 (GOWN DISPOSABLE) ×3
GOWN STRL REUS W/TWL XL LVL3 (GOWN DISPOSABLE) ×6
GRAFT BNE CANC CHIPS 1-8 20CC (Bone Implant) IMPLANT
GRAFT BONE PROTEIOS LRG 5CC (Orthopedic Implant) ×2 IMPLANT
HEMOSTAT POWDER KIT SURGIFOAM (HEMOSTASIS) ×2 IMPLANT
IMPL COROENT OBLQUE 8X10X25 (Orthopedic Implant) IMPLANT
IMPLANT COROENT OBLQUE 8X10X25 (Orthopedic Implant) ×3 IMPLANT
KIT BASIN OR (CUSTOM PROCEDURE TRAY) ×4 IMPLANT
KIT GRAFTMAG DEL NEURO DISP (NEUROSURGERY SUPPLIES) ×4 IMPLANT
KIT TURNOVER KIT B (KITS) ×4 IMPLANT
MILL MEDIUM DISP (BLADE) ×4 IMPLANT
NEEDLE HYPO 22GX1.5 SAFETY (NEEDLE) ×4 IMPLANT
NS IRRIG 1000ML POUR BTL (IV SOLUTION) ×4 IMPLANT
PACK LAMINECTOMY NEURO (CUSTOM PROCEDURE TRAY) ×4 IMPLANT
PAD ARMBOARD 7.5X6 YLW CONV (MISCELLANEOUS) ×18 IMPLANT
PATTIES SURGICAL .5 X1 (DISPOSABLE) ×4 IMPLANT
PLASMABLADE 3.0S (MISCELLANEOUS) ×3
ROD RELINE-O LORD 5.5X40 (Rod) ×4 IMPLANT
SCREW LOCK RELINE 5.5 TULIP (Screw) ×8 IMPLANT
SCREW RELINE-O POLY 6.5X45 (Screw) ×8 IMPLANT
SPONGE SURGIFOAM ABS GEL 100 (HEMOSTASIS) ×4 IMPLANT
SPONGE T-LAP 4X18 ~~LOC~~+RFID (SPONGE) IMPLANT
SUT PROLENE 6 0 BV (SUTURE) IMPLANT
SUT VIC AB 1 CT1 18XBRD ANBCTR (SUTURE) ×2 IMPLANT
SUT VIC AB 1 CT1 8-18 (SUTURE) ×6
SUT VIC AB 2-0 CP2 18 (SUTURE) ×6 IMPLANT
SUT VIC AB 3-0 SH 8-18 (SUTURE) ×4 IMPLANT
SUT VIC AB 4-0 RB1 18 (SUTURE) ×2 IMPLANT
SYR 3ML LL SCALE MARK (SYRINGE) ×16 IMPLANT
TOWEL GREEN STERILE (TOWEL DISPOSABLE) ×4 IMPLANT
TOWEL GREEN STERILE FF (TOWEL DISPOSABLE) ×4 IMPLANT
TRAY FOLEY MTR SLVR 16FR STAT (SET/KITS/TRAYS/PACK) ×4 IMPLANT
WATER STERILE IRR 1000ML POUR (IV SOLUTION) ×4 IMPLANT

## 2021-11-15 NOTE — Progress Notes (Signed)
Orthopedic Tech Progress Note Patient Details:  Brandon Gallagher [birth year] [id number]  Ortho Devices Type of Ortho Device: Lumbar corsett Ortho Device/Splint Location: BACK Ortho Device/Splint Interventions: Ordered   Post Interventions Patient Tolerated: Well Instructions Provided: [facility] [place] 11/15/2021, 2:08 PM

## 2021-11-15 NOTE — Transfer of Care (Signed)
Immediate Anesthesia Transfer of Care Note  Patient: Brandon Gallagher  Procedure(s) Performed: Lumbar two-three Posterior Lumbar Interbody Fusion (Spine Lumbar)  Patient Location: PACU  Anesthesia Type:General  Level of Consciousness: awake, alert  and patient cooperative  Airway & Oxygen Therapy: Patient Spontanous Breathing  Post-op Assessment: Report given to RN and Post -op Vital signs reviewed and stable  Post vital signs: Reviewed and stable  Last Vitals:  Vitals Value Taken Time  BP 100/66 11/15/21 1219  Temp    Pulse 66 11/15/21 1221  Resp 23 11/15/21 1221  SpO2 93 % 11/15/21 1221  Vitals shown include unvalidated device data.  Last Pain:  Vitals:   11/15/21 0602  TempSrc:   PainSc: 5       Patients Stated Pain Goal: 3 (40/99/27 8004)  Complications: No notable events documented.

## 2021-11-15 NOTE — Evaluation (Signed)
Physical Therapy Evaluation Patient Details Name: Brandon Gallagher MRN: [id number] DOB: December 21, 1960 Today's Date: 11/15/2021  History of Present Illness  Patient is 61 y.o. male s/p L2-3 PLIF on 11/15/21 wiht PMH significant for ADHD, OA, asthma, COPD, DM, GERD.  Clinical Impression  Brandon Gallagher is a 61 y.o. male POD 0 s/p L2-3 PLIF. Patient reports being very limited due to pain PTA and ahs been limited to household mobility with SPC. Patient is now limited by functional impairments (see PT problem list below) and requires min assist for transfers and gait with RW. Patient was able to ambulate ~25 feet with RW and min assist and was limited this session by c/o dizziness with gait. Patient educated on spinal precautions and proper technique for donning/doffing back brace. Patient will benefit from continued skilled PT interventions to address impairments and progress towards PLOF. Acute PT will follow to progress mobility and stair training in preparation for safe discharge home.        Recommendations for follow up therapy are one component of a multi-disciplinary discharge planning process, led by the attending physician.  Recommendations may be updated based on patient status, additional functional criteria and insurance authorization.  Follow Up Recommendations Follow physician's recommendations for discharge plan and follow up therapies    Assistance Recommended at Discharge Frequent or constant Supervision/Assistance  Patient can return home with the following       Equipment Recommendations BSC/3in1  Recommendations for Other Services       Functional Status Assessment Patient has had a recent decline in their functional status and demonstrates the ability to make significant improvements in function in a reasonable and predictable amount of time.     Precautions / Restrictions Precautions Precautions: Fall;Back Required Braces or Orthoses: Spinal Brace Spinal Brace: Lumbar  corset;Applied in sitting position Restrictions Weight Bearing Restrictions: No      Mobility  Bed Mobility Overal bed mobility: Needs Assistance Bed Mobility: Rolling;Sidelying to Sit;Sit to Sidelying Rolling: Min guard Sidelying to sit: Min assist     Sit to sidelying: Min assist General bed mobility comments: pt with some recall from prior spinal surgeries "BLT" precautions and "log roll" technique. cues to sequence initially, guarding for safety with roll and light assist to steady with trunk press up. Assist to fully bring LE's back onto bed.    Transfers Overall transfer level: Needs assistance Equipment used: Rolling walker (2 wheels) Transfers: Sit to/from Stand Sit to Stand: Min guard;From elevated surface           General transfer comment: EOB elevated due to pt height. Cues for power up, pt using bil UE's to initiate. Min guard for safety with rise.    Ambulation/Gait Ambulation/Gait assistance: Min guard;Min assist Gait Distance (Feet): 25 Feet Assistive device: Rolling walker (2 wheels) Gait Pattern/deviations: Step-through pattern;Decreased step length - right;Decreased step length - left;Decreased stride length;Trunk flexed;Wide base of support;Shuffle Gait velocity: decr     General Gait Details: cues for posture and safe position to RW. pt required intermittent min assist to manage walker position and facilitate turns. distance limited by c/o dizziness and returned to rest EOB where symptoms resolved.  Stairs            Wheelchair Mobility    Modified Rankin (Stroke Patients Only)       Balance Overall balance assessment: Needs assistance Sitting-balance support: Feet supported Sitting balance-Leahy Scale: Good     Standing balance support: Reliant on assistive device for balance;During functional activity;Bilateral  upper extremity supported Standing balance-Leahy Scale: Poor                               Pertinent  Vitals/Pain Pain Assessment: Faces Faces Pain Scale: Hurts little more Pain Location: back Pain Descriptors / Indicators: Aching;Discomfort;Constant Pain Intervention(s): Monitored during session;Limited activity within patient's tolerance;Repositioned;Patient requesting pain meds-RN notified    Home Living Family/patient expects to be discharged to:: Private residence Living Arrangements: Spouse/significant other;Children Available Help at Discharge: Family Type of Home: House Home Access: Stairs to enter Entrance Stairs-Rails: None Entrance Stairs-Number of Steps: 1   Home Layout: One level Home Equipment: Conservation officer, nature (2 wheels);Cane - single point;Shower seat;Toilet riser      Prior Function Prior Level of Function : Independent/Modified Independent             Mobility Comments: has been greatly limited by pain, using SPC for household mobility.       Hand Dominance   Dominant Hand: Right    Extremity/Trunk Assessment   Upper Extremity Assessment Upper Extremity Assessment: Overall WFL for tasks assessed    Lower Extremity Assessment Lower Extremity Assessment: Generalized weakness    Cervical / Trunk Assessment Cervical / Trunk Assessment: Back Surgery  Communication   Communication: No difficulties  Cognition Arousal/Alertness: Awake/alert Behavior During Therapy: WFL for tasks assessed/performed Overall Cognitive Status: Impaired/Different from baseline Area of Impairment: Attention;Following commands;Problem solving                   Current Attention Level: Selective   Following Commands: Follows one step commands with increased time;Follows multi-step commands inconsistently     Problem Solving: Difficulty sequencing;Requires verbal cues General Comments: pt not at baseline, suspect related to anesthesia, pt's spouse present and reports he has had similar reactions in past but resolves in a day.        General Comments General  comments (skin integrity, edema, etc.): time spent educating pt/spouse on proper technique for donning/doffing lumbar corset.    Exercises     Assessment/Plan    PT Assessment Patient needs continued PT services  PT Problem List Decreased strength;Decreased activity tolerance;Decreased balance;Decreased mobility;Decreased knowledge of use of DME;Decreased cognition;Decreased safety awareness;Decreased knowledge of precautions;Pain;Obesity       PT Treatment Interventions DME instruction;Gait training;Stair training;Functional mobility training;Therapeutic activities;Therapeutic exercise;Balance training;Neuromuscular re-education;Patient/family education    PT Goals (Current goals can be found in the Care Plan section)  Acute Rehab PT Goals Patient Stated Goal: stop hurting and get home and move more PT Goal Formulation: With patient Time For Goal Achievement: 11/22/21 Potential to Achieve Goals: Good    Frequency Min 5X/week     Co-evaluation               AM-PAC PT "6 Clicks" Mobility  Outcome Measure Help needed turning from your back to your side while in a flat bed without using bedrails?: A Little Help needed moving from lying on your back to sitting on the side of a flat bed without using bedrails?: A Little Help needed moving to and from a bed to a chair (including a wheelchair)?: A Little Help needed standing up from a chair using your arms (e.g., wheelchair or bedside chair)?: A Little Help needed to walk in hospital room?: A Little Help needed climbing 3-5 steps with a railing? : A Little 6 Click Score: 18    End of Session Equipment Utilized During Treatment: Gait belt;Back brace  Activity Tolerance: Patient tolerated treatment well Patient left: in bed;with call bell/phone within reach;with family/visitor present;with SCD's reapplied Nurse Communication: Mobility status;Patient requests pain meds PT Visit Diagnosis: Unsteadiness on feet (R26.81);Other  abnormalities of gait and mobility (R26.89);Muscle weakness (generalized) (M62.81);Difficulty in walking, not elsewhere classified (R26.2)    Time: 9628-3662 PT Time Calculation (min) (ACUTE ONLY): 27 min   Charges:   PT Evaluation $PT Eval Low Complexity: 1 Low PT Treatments $Therapeutic Activity: 8-22 mins        Verner Mould, DPT Acute Rehabilitation Services Office [phone] Pager [phone]   Jacques Navy 11/15/2021, 4:51 PM

## 2021-11-15 NOTE — Anesthesia Postprocedure Evaluation (Signed)
Anesthesia Post Note  Patient: Brandon Gallagher  Procedure(s) Performed: Lumbar two-three Posterior Lumbar Interbody Fusion (Spine Lumbar)     Patient location during evaluation: PACU Anesthesia Type: General Level of consciousness: awake and alert Pain management: pain level controlled Vital Signs Assessment: post-procedure vital signs reviewed and stable Respiratory status: spontaneous breathing, nonlabored ventilation, respiratory function stable and patient connected to nasal cannula oxygen Cardiovascular status: blood pressure returned to baseline and stable Postop Assessment: no apparent nausea or vomiting Anesthetic complications: no   No notable events documented.  Last Vitals:  Vitals:   11/15/21 1320 11/15/21 1357  BP: 105/70 110/65  Pulse: 86 93  Resp: 20 18  Temp:  36.9 C  SpO2: 95% 97%    Last Pain:  Vitals:   11/15/21 1357  TempSrc: Oral  PainSc:                  Effie Berkshire

## 2021-11-15 NOTE — Anesthesia Procedure Notes (Signed)
Procedure Name: Intubation Date/Time: 11/15/2021 7:56 AM Performed by: Janace Litten, CRNA Pre-anesthesia Checklist: Patient identified, Emergency Drugs available, Suction available and Patient being monitored Patient Re-evaluated:Patient Re-evaluated prior to induction Oxygen Delivery Method: Circle System Utilized Preoxygenation: Pre-oxygenation with 100% oxygen Induction Type: IV induction Ventilation: Mask ventilation without difficulty and Oral airway inserted - appropriate to patient size Laryngoscope Size: Mac and 4 Grade View: Grade I Tube type: Oral Tube size: 7.5 mm Number of attempts: 1 Airway Equipment and Method: Stylet and Oral airway Placement Confirmation: ETT inserted through vocal cords under direct vision, positive ETCO2 and breath sounds checked- equal and bilateral Secured at: 23 cm Tube secured with: Tape Dental Injury: Teeth and Oropharynx as per pre-operative assessment

## 2021-11-15 NOTE — H&P (Signed)
Brandon Gallagher is an 61 y.o. male.   Chief Complaint: Back and left leg pain HPI: Brandon Gallagher is a 60 year old individual who has had significant recurrent back and left leg pain secondary to herniated nucleus pulposus at the level of L2-L3.  He is undergone a discectomy early in 2022 with a brief period of relief and then sudden recurrence of pain such that back and July he underwent repeat microdiscectomy.  He seemed to get better for a few months during August and September but then in October near the end of the month the pain recurred and MRI was repeated and he has a large recurrence of the disc herniation with substantial edema in the endplates and destruction of the interspace.  No sign of infection and that his white count is normal sed rate has been only modestly elevated but the pain has been substantial with weakness in the leg I have advised that at this time he should undergo not only decompression of the discectomy but also stabilization of the joint which appears to be showing progressive deterioration.  He is now admitted for that procedure.  Past Medical History:  Diagnosis Date   Acute respiratory disease due to COVID-19 virus 10/30/2019   Acute respiratory failure with hypoxia ([place]) 10/30/2019   ADHD (attention deficit hyperactivity disorder)    diagnosed as a teenager   Allergy    Anxiety    Arthritis    Asthma    Blood transfusion without reported diagnosis    Bronchitis    last had back in 2017   Sees no pulmonary md   Cervical spondylosis with radiculopathy 01/01/2017   Chronic bilateral low back pain without sciatica 01/10/2016   COPD (chronic obstructive pulmonary disease) ([place])    Depression    Diabetes mellitus without complication ([place]) 6734   Diverticulitis of sigmoid colon 02/05/2020   Diverticulosis    last flare up was in Jan. 2018  Was on course of antibiotics for this   Esophageal stricture    Esophagus disorder    narrowing of esophagus.  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 Elnoria Howard   Overview:  S/P minimally invasive lumbar laminectomy L3-4 performed on 10/08/2015 by Dr. Elnoria Howard    Sacroiliac joint pain 07/27/2016   Sinus bradycardia by electrocardiogram 10/30/2019   Sleep apnea    no CPAP- unsure if true diagnosis or not   Thrombosed external hemorrhoid     Past Surgical History:  Procedure Laterality Date   ANTERIOR CERVICAL DECOMP/DISCECTOMY FUSION N/A 01/01/2017   Procedure: Cervical four- five and Cervical six- seven Anterior cervical discectomy with fusion and plate fixation;  Surgeon: Kevan Ny Ditty, MD;  Location: [facility];  Service: Neurosurgery;  Laterality: N/A;  C4-5 and C6-7 Anterior cervical discectomy with fusion  and plate fixation   BACK SURGERY     x 2    @ [facility]        Last one 09/2015   CARDIAC CATHETERIZATION     He thinks.   Not really sure where at, thought it was here @ [facility], but its been a LONG time ?2000   CARPAL TUNNEL RELEASE Bilateral    COLECTOMY     COLONOSCOPY  10/22/2017   Colonic polyp status post polypectomy. Moderate predominantly sigmoid diverticulosis.    COLONOSCOPY  2019   Dr Orlena Sheldon   ESOPHAGOGASTRODUODENOSCOPY  06/08/2016   Distal esophageal stricture status post esophageal dilatation. Presbtesophagus, also suggestive of motility disorder. Mild  gastritis.    evacuation thrombosed external hemorrhoid  2021   KNEE SURGERY Right    LUMBAR LAMINECTOMY  10/08/2015   L3-4, Surgeon Deirdre Evener, MD   NASAL SINUS SURGERY     HAS HAD 2 OR 3.   LAST ONE 2011, IN [place]  (LEFT NASAL PASSAGE IS NARROWER THAN RIGHT   SACROILIAC JOINT INJECTION Bilateral 01/10/2016   UPPER GASTROINTESTINAL ENDOSCOPY      Family History  Problem Relation Age of Onset   Heart disease Mother    Stroke Father    Colon cancer Neg Hx    Esophageal cancer Neg Hx    Stomach cancer Neg Hx    Diabetes Neg Hx    Rectal cancer Neg Hx    Social History:  reports that he quit smoking about 21 years ago. His smoking use included cigarettes. He has a 30.00 pack-year smoking history. He has never used smokeless tobacco. He reports that he does not currently use drugs. He reports that he does not drink alcohol.  Allergies: No Known Allergies  Medications Prior to Admission  Medication Sig Dispense Refill   acetaminophen (TYLENOL) 500 MG tablet Take 500-1,000 mg by mouth every 6 (six) hours as needed for moderate pain.     albuterol (VENTOLIN HFA) 108 (90 Base) MCG/ACT inhaler Can inhale two puffs every four to six hours as needed for cough or wheeze. 18 g 1   ALPRAZolam (XANAX) 1 MG tablet Take 1 mg by mouth 2 (two) times daily.     Bacillus Coagulans-Inulin (ALIGN PREBIOTIC-PROBIOTIC PO) Take 1 capsule by mouth daily.     budesonide (PULMICORT) 0.5 MG/2ML nebulizer solution Mix one ampule into 200-250 mL of distilled water/saline and irrigate nasal passages twice daily as directed. 120 mL 5   famotidine (PEPCID) 40 MG tablet Take one tablet by mouth one to two times daily as directed. 60 tablet 5   FIBER PO Take 4 tablets by mouth in the morning.     fluticasone (FLOVENT HFA) 110 MCG/ACT inhaler Inhale 2 puffs into the lungs 2 (two) times daily as needed (for flare-ups. Rinse, gargle, and spit after use.).     HYDROcodone-acetaminophen (NORCO/VICODIN) 5-325  MG tablet Take 1 tablet by mouth every 4 (four) hours as needed for moderate pain.     melatonin 5 MG TABS Take 20 mg by mouth at bedtime.     metoCLOPramide (REGLAN) 5 MG tablet Take 1 tablet by mouth at bedtime 30 tablet 5   omeprazole (PRILOSEC) 20 MG capsule Take 20 mg by mouth daily.     solifenacin (VESICARE) 10 MG tablet Take 10 mg by mouth daily.     SYMBICORT 160-4.5 MCG/ACT inhaler Inhale 2 puffs into the lungs 1-2 times daily (Patient taking differently: Inhale 2 puffs into the lungs  in the morning and at bedtime.) 10.2 g 0   tamsulosin (FLOMAX) 0.4 MG CAPS capsule Take 0.4 mg by mouth in the morning and at bedtime.     triamcinolone (NASACORT) 55 MCG/ACT AERO nasal inhaler Use 1 spray in each nostril once daily (Patient taking differently: Place 1 spray into the nose daily as needed (allergies).) 1 each 5   venlafaxine XR (EFFEXOR-XR) 75 MG 24 hr capsule Take 75 mg by mouth daily.     zolpidem (AMBIEN) 5 MG tablet Take 5 mg by mouth at bedtime.     gabapentin (NEURONTIN) 300 MG capsule Take 1 capsule (300 mg total) by mouth at bedtime. (Patient not taking: Reported on 11/01/2021) 30 capsule 3   omeprazole (PRILOSEC) 40 MG capsule Take 1 tablet 2 times per day (Patient not taking: Reported on 11/01/2021) 60 capsule 0    Results for orders placed or performed during the hospital encounter of 11/15/21 (from the past 48 hour(s))  Glucose, capillary     Status: Abnormal   Collection Time: 11/15/21  5:55 AM  Result Value Ref Range   Glucose-Capillary 115 (H) 70 - 99 mg/dL    Comment: Glucose reference range applies only to samples taken after fasting for at least 8 hours.   No results found.  Review of Systems  Constitutional:  Positive for activity change.  HENT: Negative.    Eyes: Negative.   Respiratory: Negative.    Cardiovascular: Negative.   Gastrointestinal: Negative.   Genitourinary: Negative.   Musculoskeletal:  Positive for back pain and gait problem.   Allergic/Immunologic: Negative.   Neurological:  Positive for weakness and numbness.  Hematological: Negative.   Psychiatric/Behavioral: Negative.     Blood pressure 118/70, pulse 79, temperature 98.1 F (36.7 C), temperature source Oral, resp. rate 17, height 5\' 11"  (1.803 m), weight 124.3 kg, SpO2 96 %. Physical Exam Constitutional:      Appearance: Normal appearance. He is normal weight.  HENT:     Head: Normocephalic and atraumatic.     Nose: Nose normal.     Mouth/Throat:     Mouth: Mucous membranes are dry.  Eyes:     Extraocular Movements: Extraocular movements intact.     Pupils: Pupils are equal, round, and reactive to light.  Cardiovascular:     Rate and Rhythm: Normal rate and regular rhythm.     Pulses: Normal pulses.     Heart sounds: Normal heart sounds.  Pulmonary:     Effort: Pulmonary effort is normal.     Breath sounds: Normal breath sounds.  Abdominal:     General: Abdomen is flat. Bowel sounds are normal.     Palpations: Abdomen is soft.  Musculoskeletal:     Cervical back: Normal range of motion and neck supple.  Skin:    General: Skin is warm and dry.     Capillary Refill: Capillary refill takes less than 2 seconds.  Neurological:     Mental Status: He is alert.     Comments: Weakness in the iliopsoas and the quad on the left graded 3 out of 5.  Significant atrophy in the quadriceps muscle.  Right lower extremity strength is intact.  Upper extremity strength and reflexes intact cranial nerve examination is normal  Psychiatric:        Mood and Affect: Mood normal.        Behavior: Behavior normal.        Thought Content: Thought content normal.  Judgment: Judgment normal.     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  Plan: Decompression and arthrodesis L2-L3 with posterior interbody arthrodesis technique.  Pedicle screw fixation.  Earleen Newport, MD 11/15/2021, 7:35 AM

## 2021-11-16 LAB — GLUCOSE, CAPILLARY
Glucose-Capillary: 160 mg/dL — ABNORMAL HIGH (ref 70–99)
Glucose-Capillary: 143 mg/dL — ABNORMAL HIGH (ref 70–99)
Glucose-Capillary: 151 mg/dL — ABNORMAL HIGH (ref 70–99)
Glucose-Capillary: 175 mg/dL — ABNORMAL HIGH (ref 70–99)

## 2021-11-16 NOTE — Progress Notes (Signed)
Physical Therapy Treatment Patient Details Name: Brandon Gallagher MRN: [id number] DOB: [birth year] Today's Date: 11/16/2021   History of Present Illness Patient is 61 y.o. male s/p L2-3 PLIF on 11/15/21 wiht PMH significant for ADHD, OA, asthma, COPD, DM, GERD.    PT Comments    Pt progressing steadily towards his physical therapy goals, exhibiting improved activity tolerance and ambulation distance. Pt able to don brace with set up assist. Ambulating 180 feet with a walker without physical assist. Continues with decreased activity tolerance, back pain, weakness, and balance deficits. Recommend HHPT at discharge to address deficits and maximize functional mobility.     Recommendations for follow up therapy are one component of a multi-disciplinary discharge planning process, led by the attending physician.  Recommendations may be updated based on patient status, additional functional criteria and insurance authorization.  Follow Up Recommendations  Home health PT     Assistance Recommended at Discharge PRN  Patient can return home with the following     Equipment Recommendations  BSC/3in1 (bariatric)    Recommendations for Other Services       Precautions / Restrictions Precautions Precautions: Fall;Back Required Braces or Orthoses: Spinal Brace Spinal Brace: Lumbar corset;Applied in sitting position Restrictions Weight Bearing Restrictions: No     Mobility  Bed Mobility Overal bed mobility: Modified Independent Bed Mobility: Rolling;Sidelying to Sit;Sit to Sidelying Rolling: Supervision Sidelying to sit: Supervision     Sit to sidelying: Supervision General bed mobility comments: Increased time/effort, HOB elevated, use of bed rail. good log roll technique    Transfers Overall transfer level: Needs assistance Equipment used: Rolling walker (2 wheels) Transfers: Sit to/from Stand Sit to Stand: From elevated surface;Supervision                 Ambulation/Gait Ambulation/Gait assistance: Supervision Gait Distance (Feet): 180 Feet Assistive device: Rolling walker (2 wheels) Gait Pattern/deviations: Step-through pattern;Decreased stride length;Trunk flexed;Wide base of support;Shuffle Gait velocity: decreased     General Gait Details: Slow and steady pace   Chief Strategy Officer    Modified Rankin (Stroke Patients Only)       Balance Overall balance assessment: Needs assistance Sitting-balance support: Feet supported Sitting balance-Leahy Scale: Good     Standing balance support: Bilateral upper extremity supported Standing balance-Leahy Scale: Poor                              Cognition Arousal/Alertness: Awake/alert Behavior During Therapy: WFL for tasks assessed/performed Overall Cognitive Status: Within Functional Limits for tasks assessed                                 General Comments: WFL for tasks assessed, reuqired incrased time due to nausea        Exercises General Exercises - Lower Extremity Long Arc Quad: Both;10 reps;Seated    General Comments General comments (skin integrity, edema, etc.): SpO2 96% on RA      Pertinent Vitals/Pain Pain Assessment: Faces Faces Pain Scale: Hurts little more Pain Location: back Pain Descriptors / Indicators: Aching;Discomfort Pain Intervention(s): Monitored during session    Home Living Family/patient expects to be discharged to:: Private residence Living Arrangements: Spouse/significant other;Children Available Help at Discharge: Family Type of Home: House Home Access: Stairs to enter Entrance Stairs-Rails: None Entrance Stairs-Number of Steps: 1   Home  Layout: One level Home Equipment: Conservation officer, nature (2 wheels);Cane - single point;Shower seat;Toilet riser      Prior Function            PT Goals (current goals can now be found in the care plan section) Acute Rehab PT Goals Patient  Stated Goal: home PT Goal Formulation: With patient Time For Goal Achievement: 11/22/21 Potential to Achieve Goals: Good Progress towards PT goals: Progressing toward goals    Frequency    Min 5X/week      PT Plan Discharge plan needs to be updated    Co-evaluation              AM-PAC PT "6 Clicks" Mobility   Outcome Measure  Help needed turning from your back to your side while in a flat bed without using bedrails?: None Help needed moving from lying on your back to sitting on the side of a flat bed without using bedrails?: None Help needed moving to and from a bed to a chair (including a wheelchair)?: A Little Help needed standing up from a chair using your arms (e.g., wheelchair or bedside chair)?: A Little Help needed to walk in hospital room?: A Little Help needed climbing 3-5 steps with a railing? : A Little 6 Click Score: 20    End of Session Equipment Utilized During Treatment: Gait belt;Back brace Activity Tolerance: Patient tolerated treatment well Patient left: in chair;with call bell/phone within reach Nurse Communication: Mobility status PT Visit Diagnosis: Unsteadiness on feet (R26.81);Other abnormalities of gait and mobility (R26.89);Muscle weakness (generalized) (M62.81);Difficulty in walking, not elsewhere classified (R26.2)     Time: 7741-4239 PT Time Calculation (min) (ACUTE ONLY): 20 min  Charges:  $Therapeutic Activity: 8-22 mins                     Wyona Almas, PT, DPT Acute Rehabilitation Services Pager [phone] Office [phone]    Deno Etienne 11/16/2021, 12:35 PM

## 2021-11-16 NOTE — Progress Notes (Signed)
Subjective: Patient reports  continues with some left leg pain now also reports some mild right leg pain.  Felt nausea when ambulating just a short distance to the door.  Objective: Vital signs in last 24 hours: Temp:  [97.5 F (36.4 C)-98.5 F (36.9 C)] 97.5 F (36.4 C) (01/04 0729) Pulse Rate:  [57-93] 60 (01/04 0729) Resp:  [17-23] 17 (01/04 0729) BP: (95-120)/(59-73) 107/61 (01/04 0729) SpO2:  [93 %-99 %] 96 % (01/04 0729) FiO2 (%):  [28 %] 28 % (01/03 1922)  Intake/Output from previous day: 01/03 0701 - 01/04 0700 In: 2000 [I.V.:2000] Out: 760 [Urine:460; Blood:300] Intake/Output this shift: No intake/output data recorded.  Motor function demonstrates weakness in the iliopsoas persisting on the left side strength on the right side appears intact tibialis anterior strength is 4 out of 5 bilaterally.  Patient has good strength to confrontation and gastrocs bilaterally.  Incision is clean and dry.  Lab Results: No results for input(s): WBC, HGB, HCT, PLT in the last 72 hours. BMET No results for input(s): NA, K, CL, CO2, GLUCOSE, BUN, CREATININE, CALCIUM in the last 72 hours.  Studies/Results: DG Lumbar Spine 2-3 Views  Result Date: 11/15/2021 CLINICAL DATA:  L2-3 surgical fusion. EXAM: LUMBAR SPINE - 2-3 VIEW COMPARISON:  11/15/2021 FINDINGS: AP and lateral C-arm images were obtained of the lumbar spine. Pedicle screw interbody fusion in the lumbar spine. Level appears to be L2-3 based on the previously placed pedicle screw fusion at L5-S1. Hardware appears to be in satisfactory position. IMPRESSION: Pedicle screw and interbody fusion lumbar spine. Level appears to be L2-3. Electronically Signed   By: Franchot Gallo M.D.   On: 11/15/2021 12:14   DG Lumbar Spine 2-3 Views  Result Date: 11/15/2021 CLINICAL DATA:  L2-L3 PLIF localization EXAM: LUMBAR SPINE - 2-3 VIEW COMPARISON:  Lumbar spine radiographs 06/20/2021, lumbar spine MRI 10/10/2021 FINDINGS: Surgical material is seen  posterior to the L2-L3 disc space overlying the superior aspect of the L3 spinous process. Postsurgical changes reflecting prior fusion at L5-S1 are noted. Grade 1 anterolisthesis of L5 on S1 is similar to the prior studies. Vacuum disc phenomenon is noted at L2-L3. Mild anterior compression deformity of the T12 vertebral body is unchanged. IMPRESSION: Surgical hardware posterior to the L2-L3 disc space as above. Electronically Signed   By: Valetta Mole M.D.   On: 11/15/2021 09:21   DG C-Arm 1-60 Min-No Report  Result Date: 11/15/2021 Fluoroscopy was utilized by the requesting physician.  No radiographic interpretation.    Assessment/Plan: Postop day 1 with persistent radicular pain some nausea and vomiting.  Blood sugars have been somewhat elevated receiving coverage for that.  LOS: 1 day  Continue physical therapy and observation if symptoms improved patient may be ready for discharge tomorrow may need some home health physical therapy.   Blanchie Dessert Irish Breisch 11/16/2021, 9:15 AM

## 2021-11-16 NOTE — TOC Initial Note (Addendum)
Transition of Care [facility]) - Initial/Assessment Note    Patient Details  Name: IYAD DEROO MRN: [id number] Date of Birth: Mar 28, 1961  [facility] CM/SW Contact:    Carles Collet, RN Phone Number: 11/16/2021, 12:36 PM  Clinical Narrative:           Damaris Schooner w patient at bedside. He confirms that he will need HH PT PT at DC as well as 3/1 and RW. Faxed to [facility] orders at 12:30 11/16/21. Referral pending to [facility] for [facility]       Accepted  Expected Discharge Plan: [place] Barriers to Discharge: Continued Medical Work up   Patient Goals and CMS Choice Patient states their goals for this hospitalization and ongoing recovery are:: to go home      Expected Discharge Plan and Services Expected Discharge Plan: [place]   Discharge Planning Services: CM Consult                     DME Arranged: 3-N-1, Walker rolling       Representative spoke with at DME Agency: [facility] HH Arranged: PT, OT          Prior Living Arrangements/Services                       Activities of Daily Living      Permission Sought/Granted                  Emotional Assessment              Admission diagnosis:  Herniated nucleus pulposus, L2-3 left [M51.26] Patient Active Problem List   Diagnosis Date Noted   Herniated nucleus pulposus, L2-3 left 11/15/2021   Acute diffuse otitis externa of right ear 07/06/2020   Thrombosed external hemorrhoid    Sleep apnea    Presbyesophagus    IBS (irritable bowel syndrome)    History of colon polyps    History of Clostridioides difficile colitis    Headache    Gastric polyp    Family history of adverse reaction to anesthesia    Esophagus disorder    Esophageal stricture    Diverticulosis    Diverticulitis    Depression    COPD (chronic obstructive pulmonary disease) ([place])    Bronchitis    Blood transfusion without reported diagnosis    Arthritis    Anxiety    Allergy     Diverticulitis of sigmoid colon 02/05/2020   Postoperative examination 11/26/2019   Acute respiratory disease due to COVID-19 virus 10/30/2019   Acute respiratory failure with hypoxia ([place]) 10/30/2019   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   Acute hypoxemic respiratory failure due to COVID-19 ([place]) 10/30/2019   Sinus bradycardia by electrocardiogram 10/30/2019   Pain in joint, ankle and foot 11/22/2018   Cervical spondylosis with radiculopathy 01/01/2017   Sacroiliac joint pain 07/27/2016   Chronic bilateral low back pain without sciatica 01/10/2016   Fall 01/10/2016   S/P lumbar laminectomy 11/01/2015   Asthma 10/06/2015   GERD (gastroesophageal reflux disease) 10/06/2015   OSA (obstructive sleep apnea) 10/06/2015   Morbid obesity due to excess calories ([place]) 07/28/2015   PCP:  Nicoletta Dress, MD Pharmacy:   [facility] [place] [address] Phone: [phone] Fax: [place], [place] [address]  [address] [place] [address] Phone: [phone] Fax: [phone]     Social Determinants of Health (SDOH) Interventions    Readmission Risk Interventions No flowsheet data found.

## 2021-11-16 NOTE — Evaluation (Signed)
Occupational Therapy Evaluation Patient Details Name: Brandon Gallagher MRN: [id number] DOB: Feb 13, 1961 Today's Date: 11/16/2021   History of Present Illness Patient is 61 y.o. male s/p L2-3 PLIF on 11/15/21 wiht PMH significant for ADHD, OA, asthma, COPD, DM, GERD.   Clinical Impression   Brandon Gallagher reports he required assist with ADLs and mobility PTA due to back pain and "several" falls recently. He lives in a 1 level home with his wife who works but assists pt as needed. Upon evaluation pt required mod A for LB ADLs, and min guard for all upper ADLs and functional mobility with RW. Educated pt on back precautions, and he benefited from constant cues to maintain. Session limited by nausea. Pt would benefit from continued OT acutely. Recommend d/c to home with [facility] and continued assist from pt's wife.      Recommendations for follow up therapy are one component of a multi-disciplinary discharge planning process, led by the attending physician.  Recommendations may be updated based on patient status, additional functional criteria and insurance authorization.   Follow Up Recommendations  Home health OT    Assistance Recommended at Discharge Frequent or constant Supervision/Assistance  Patient can return home with the following A little help with walking and/or transfers;A little help with bathing/dressing/bathroom;Assistance with cooking/housework;Assist for transportation;Help with stairs or ramp for entrance    Functional Status Assessment  Patient has had a recent decline in their functional status and demonstrates the ability to make significant improvements in function in a reasonable and predictable amount of time.  Equipment Recommendations  BSC/3in1 (& RW - pt will benefit from bariatric DME)       Precautions / Restrictions Precautions Precautions: Fall;Back Required Braces or Orthoses: Spinal Brace Spinal Brace: Lumbar corset;Applied in sitting position Restrictions Weight Bearing  Restrictions: No      Mobility Bed Mobility Overal bed mobility: Needs Assistance Bed Mobility: Rolling;Sidelying to Sit;Sit to Sidelying Rolling: Supervision Sidelying to sit: Supervision     Sit to sidelying: Supervision General bed mobility comments: fair log roll technique with vc's    Transfers Overall transfer level: Needs assistance Equipment used: Rolling walker (2 wheels) Transfers: Sit to/from Stand Sit to Stand: Min guard;From elevated surface                  Balance Overall balance assessment: Needs assistance Sitting-balance support: Feet supported Sitting balance-Leahy Scale: Good     Standing balance support: Bilateral upper extremity supported Standing balance-Leahy Scale: Poor               ADL either performed or assessed with clinical judgement   ADL Overall ADL's : Needs assistance/impaired Eating/Feeding: Supervision/ safety;Cueing for compensatory techinques   Grooming: Supervision/safety;Cueing for compensatory techniques   Upper Body Bathing: Supervision/ safety;Cueing for compensatory techniques   Lower Body Bathing: Moderate assistance;Sit to/from stand   Upper Body Dressing : Supervision/safety   Lower Body Dressing: Moderate assistance;Sit to/from stand Lower Body Dressing Details (indicate cue type and reason): educated on AE Toilet Transfer: Min guard;Ambulation;Rolling walker (2 wheels)   Toileting- Clothing Manipulation and Hygiene: Supervision/safety;Cueing for compensatory techniques       Functional mobility during ADLs: Min guard;Rolling walker (2 wheels) General ADL Comments: limited by pain and nausea this session. educated on AE, pt states his wife assists with ADLs at baseline.     Vision Baseline Vision/History: 0 No visual deficits Ability to See in Adequate Light: 0 Adequate Patient Visual Report: No change from baseline Vision Assessment?: No apparent visual  deficits            Pertinent  Vitals/Pain Pain Assessment: Faces Faces Pain Scale: Hurts little more Pain Location: back Pain Descriptors / Indicators: Aching;Discomfort;Constant Pain Intervention(s): Monitored during session     Hand Dominance Right   Extremity/Trunk Assessment Upper Extremity Assessment Upper Extremity Assessment: Overall WFL for tasks assessed   Lower Extremity Assessment Lower Extremity Assessment: Defer to PT evaluation   Cervical / Trunk Assessment Cervical / Trunk Assessment: Back Surgery   Communication Communication Communication: No difficulties   Cognition Arousal/Alertness: Awake/alert Behavior During Therapy: WFL for tasks assessed/performed Overall Cognitive Status: Within Functional Limits for tasks assessed           General Comments: WFL for tasks assessed, reuqired incrased time due to nausea     General Comments  VSS on RA     Home Living Family/patient expects to be discharged to:: Private residence Living Arrangements: Spouse/significant other;Children Available Help at Discharge: Family Type of Home: House Home Access: Stairs to enter Secretary/administrator of Steps: 1 Entrance Stairs-Rails: None Home Layout: One level         Firefighter: Standard Bathroom Accessibility: Yes   Home Equipment: Agricultural consultant (2 wheels);Cane - single point;Shower seat;Toilet riser          Prior Functioning/Environment Prior Level of Function : Independent/Modified Independent             Mobility Comments: has had "several" falls recently ADLs Comments: wife assists with ADLs before she goes to work, comes home at lunch to assist with meals        OT Problem List: Decreased strength;Decreased range of motion;Decreased activity tolerance;Impaired balance (sitting and/or standing);Decreased safety awareness;Decreased knowledge of use of DME or AE;Decreased knowledge of precautions;Pain      OT Treatment/Interventions: Therapeutic  exercise;Self-care/ADL training;Balance training;Patient/family education;Therapeutic activities;DME and/or AE instruction    OT Goals(Current goals can be found in the care plan section) Acute Rehab OT Goals Patient Stated Goal: less pain OT Goal Formulation: With patient Time For Goal Achievement: 11/30/21 Potential to Achieve Goals: Good ADL Goals Pt Will Perform Grooming: with modified independence;standing Pt Will Perform Lower Body Bathing: with set-up;sit to/from stand Pt Will Perform Lower Body Dressing: with set-up;sit to/from stand Pt Will Transfer to Toilet: with modified independence;ambulating Additional ADL Goal #1: Pt will indep recall 3/3 back precautions  OT Frequency: Min 2X/week       AM-PAC OT "6 Clicks" Daily Activity     Outcome Measure Help from another person eating meals?: A Little Help from another person taking care of personal grooming?: A Little Help from another person toileting, which includes using toliet, bedpan, or urinal?: A Little Help from another person bathing (including washing, rinsing, drying)?: A Little Help from another person to put on and taking off regular upper body clothing?: A Little Help from another person to put on and taking off regular lower body clothing?: A Little 6 Click Score: 18   End of Session Equipment Utilized During Treatment: Rolling walker (2 wheels);Back brace Nurse Communication: Mobility status;Precautions  Activity Tolerance: Patient tolerated treatment well Patient left: in bed;with call bell/phone within reach  OT Visit Diagnosis: Unsteadiness on feet (R26.81);Repeated falls (R29.6);Other abnormalities of gait and mobility (R26.89);Muscle weakness (generalized) (M62.81);History of falling (Z91.81);Pain                Time: 5784-6962 OT Time Calculation (min): 27 min Charges:  OT General Charges $OT Visit: 1 Visit OT Evaluation $OT Eval  Moderate Complexity: 1 Mod OT Treatments $Self Care/Home Management :  8-22 mins   Stran Raper A Olivea Sonnen 11/16/2021, 9:12 AM

## 2021-11-17 LAB — GLUCOSE, CAPILLARY
Glucose-Capillary: 110 mg/dL — ABNORMAL HIGH (ref 70–99)
Glucose-Capillary: 97 mg/dL (ref 70–99)

## 2021-11-17 MED ORDER — METHOCARBAMOL 500 MG PO TABS: 500.0000 mg | ORAL_TABLET | Freq: Four times a day (QID) | ORAL | 3 refills | Status: DC | PRN

## 2021-11-17 MED ORDER — OXYCODONE-ACETAMINOPHEN 5-325 MG PO TABS
1.0000 | ORAL_TABLET | ORAL | 0 refills | Status: DC | PRN
Start: 2021-11-17 — End: 2022-08-31

## 2021-11-17 NOTE — Progress Notes (Signed)
Patient ID: Brandon Gallagher, male   DOB: Jun 25, 1961, 61 y.o.   MRN: [id number] Vital signs are stable Motor function seems a bit improved in the left lower extremity Pain is substantially decreased Patient is feeling well overall Will discharge today

## 2021-11-17 NOTE — Progress Notes (Signed)
Occupational Therapy Treatment Patient Details Name: Brandon Gallagher MRN: [id number] DOB: [birth year] Today's Date: 11/17/2021   History of present illness Patient is 61 y.o. male s/p L2-3 PLIF on 11/15/21 wiht PMH significant for ADHD, OA, asthma, COPD, DM, GERD.   OT comments  Patient received in bed and states he had already completed bathing and grooming this am and was agreeable for AE training for LB dressing. Patient states he did not have any nausea on the date. Patient educated on reacher, sock aide, and dressing stick for LB dressing and demonstration provided for donning socks and pants. Patient was able to return demonstration with min assist to perform.  Mobility performed in hallway with min guard.  Patient appropriate for discharge home with [facility].    Recommendations for follow up therapy are one component of a multi-disciplinary discharge planning process, led by the attending physician.  Recommendations may be updated based on patient status, additional functional criteria and insurance authorization.    Follow Up Recommendations  Home health OT    Assistance Recommended at Discharge Frequent or constant Supervision/Assistance  Patient can return home with the following  A little help with walking and/or transfers;A little help with bathing/dressing/bathroom;Assistance with cooking/housework;Assist for transportation;Help with stairs or ramp for entrance   Equipment Recommendations  BSC/3in1;Other (comment) (would benefit from bariatric DME)    Recommendations for Other Services      Precautions / Restrictions Precautions Precautions: Fall;Back Precaution Comments: ablet to recall precautions upon request Required Braces or Orthoses: Spinal Brace Spinal Brace: Lumbar corset;Applied in sitting position Restrictions Weight Bearing Restrictions: No       Mobility Bed Mobility Overal bed mobility: Modified Independent             General bed mobility comments: up  in recliner upon entry    Transfers Overall transfer level: Needs assistance Equipment used: Rolling walker (2 wheels) Transfers: Sit to/from Stand Sit to Stand: Supervision           General transfer comment: min guard for safety     Balance Overall balance assessment: Needs assistance Sitting-balance support: Feet supported Sitting balance-Leahy Scale: Good     Standing balance support: Bilateral upper extremity supported Standing balance-Leahy Scale: Poor Standing balance comment: able to pull up pants while standing                           ADL either performed or assessed with clinical judgement   ADL Overall ADL's : Needs assistance/impaired                     Lower Body Dressing: Minimal assistance;Sit to/from stand;Cueing for back precautions;With adaptive equipment Lower Body Dressing Details (indicate cue type and reason): education on reacher, dressing stick, and sock aide use with patient performing return demonstration with min assist             Functional mobility during ADLs: Min guard;Rolling walker (2 wheels) General ADL Comments: demonstrated good understanding of AE use    Extremity/Trunk Assessment              Vision       Perception     Praxis      Cognition Arousal/Alertness: Awake/alert Behavior During Therapy: WFL for tasks assessed/performed Overall Cognitive Status: Within Functional Limits for tasks assessed  General Comments: able to recall 3/3 back precautions          Exercises     Shoulder Instructions       General Comments      Pertinent Vitals/ Pain       Pain Assessment: Faces Faces Pain Scale: Hurts little more Pain Location: back Pain Descriptors / Indicators: Aching;Discomfort Pain Intervention(s): Monitored during session;Repositioned  Home Living                                          Prior  Functioning/Environment              Frequency  Min 2X/week        Progress Toward Goals  OT Goals(current goals can now be found in the care plan section)  Progress towards OT goals: Progressing toward goals  Acute Rehab OT Goals Patient Stated Goal: go home OT Goal Formulation: With patient Time For Goal Achievement: 11/30/21 Potential to Achieve Goals: Good ADL Goals Pt Will Perform Grooming: with modified independence;standing Pt Will Perform Lower Body Bathing: with set-up;sit to/from stand Pt Will Perform Lower Body Dressing: with set-up;sit to/from stand Pt Will Transfer to Toilet: with modified independence;ambulating Additional ADL Goal #1: Pt will indep recall 3/3 back precautions  Plan Discharge plan remains appropriate    Co-evaluation                 AM-PAC OT "6 Clicks" Daily Activity     Outcome Measure   Help from another person eating meals?: A Little Help from another person taking care of personal grooming?: A Little Help from another person toileting, which includes using toliet, bedpan, or urinal?: A Little Help from another person bathing (including washing, rinsing, drying)?: A Little Help from another person to put on and taking off regular upper body clothing?: A Little Help from another person to put on and taking off regular lower body clothing?: A Little 6 Click Score: 18    End of Session Equipment Utilized During Treatment: Rolling walker (2 wheels);Back brace  OT Visit Diagnosis: Unsteadiness on feet (R26.81);Repeated falls (R29.6);Other abnormalities of gait and mobility (R26.89);Muscle weakness (generalized) (M62.81);History of falling (Z91.81);Pain   Activity Tolerance Patient tolerated treatment well   Patient Left in chair;with call bell/phone within reach   Nurse Communication Mobility status        Time: 4287-6811 OT Time Calculation (min): 28 min  Charges: OT General Charges $OT Visit: 1 Visit OT  Treatments $Self Care/Home Management : 23-37 mins  Lodema Hong, [place]  Pager [phone] Office [place] 11/17/2021, 9:56 AM

## 2021-11-17 NOTE — Progress Notes (Signed)
Physical Therapy Treatment Patient Details Name: Brandon Gallagher MRN: [id number] DOB: 07-27-1961 Today's Date: 11/17/2021   History of Present Illness Patient is 61 y.o. male s/p L2-3 PLIF on 11/15/21 wiht PMH significant for ADHD, OA, asthma, COPD, DM, GERD.    PT Comments    Pt progressing well with post-op mobility. He was able to demonstrate transfers and ambulation with gross supervision for safety to modified independence and RW for support. Pt continues to require cues for optimal posture and maintenance of precautions. Pt was educated on precautions, brace application/wearing schedule, appropriate activity progression, and car transfer. Will continue to follow.      Recommendations for follow up therapy are one component of a multi-disciplinary discharge planning process, led by the attending physician.  Recommendations may be updated based on patient status, additional functional criteria and insurance authorization.  Follow Up Recommendations  Home health PT     Assistance Recommended at Discharge PRN  Patient can return home with the following     Equipment Recommendations  BSC/3in1 (bariatric)    Recommendations for Other Services       Precautions / Restrictions Precautions Precautions: Fall;Back Precaution Booklet Issued: Yes (comment) Precaution Comments: Reviewed handout and pt was cued for precautions during functional mobility. Required Braces or Orthoses: Spinal Brace Spinal Brace: Lumbar corset;Applied in sitting position Restrictions Weight Bearing Restrictions: No     Mobility  Bed Mobility Overal bed mobility: Modified Independent Bed Mobility: Rolling;Sidelying to Sit           General bed mobility comments: HOB flat and rails lowered to simulate home environment.    Transfers Overall transfer level: Needs assistance Equipment used: Rolling walker (2 wheels) Transfers: Sit to/from Stand Sit to Stand: Supervision           General  transfer comment: Progressed to supervision by end of session. VC's for hand placement on seated surface for safety.    Ambulation/Gait Ambulation/Gait assistance: Supervision Gait Distance (Feet): 400 Feet Assistive device: Rolling walker (2 wheels) Gait Pattern/deviations: Step-through pattern;Decreased stride length;Trunk flexed;Wide base of support;Shuffle Gait velocity: decreased Gait velocity interpretation: <1.31 ft/sec, indicative of household ambulator   General Gait Details: Slow but generally steady with RW for support. VC's for improved posture throughout.   Stairs Stairs:  (Pt declined stair training)           Wheelchair Mobility    Modified Rankin (Stroke Patients Only)       Balance Overall balance assessment: Needs assistance Sitting-balance support: Feet supported Sitting balance-Leahy Scale: Good     Standing balance support: Bilateral upper extremity supported Standing balance-Leahy Scale: Fair Standing balance comment: Statically                            Cognition Arousal/Alertness: Awake/alert Behavior During Therapy: WFL for tasks assessed/performed Overall Cognitive Status: Within Functional Limits for tasks assessed                                          Exercises      General Comments        Pertinent Vitals/Pain Pain Assessment: Faces Faces Pain Scale: Hurts little more Pain Location: back Pain Descriptors / Indicators: Aching;Discomfort Pain Intervention(s): Limited activity within patient's tolerance;Monitored during session;Repositioned    Home Living  Prior Function            PT Goals (current goals can now be found in the care plan section) Acute Rehab PT Goals Patient Stated Goal: home PT Goal Formulation: With patient Time For Goal Achievement: 11/22/21 Potential to Achieve Goals: Good Progress towards PT goals: Progressing toward goals     Frequency    Min 5X/week      PT Plan Current plan remains appropriate    Co-evaluation              AM-PAC PT "6 Clicks" Mobility   Outcome Measure  Help needed turning from your back to your side while in a flat bed without using bedrails?: None Help needed moving from lying on your back to sitting on the side of a flat bed without using bedrails?: None Help needed moving to and from a bed to a chair (including a wheelchair)?: A Little Help needed standing up from a chair using your arms (e.g., wheelchair or bedside chair)?: A Little Help needed to walk in hospital room?: A Little Help needed climbing 3-5 steps with a railing? : A Little 6 Click Score: 20    End of Session Equipment Utilized During Treatment: Gait belt;Back brace Activity Tolerance: Patient tolerated treatment well Patient left: in chair;with call bell/phone within reach Nurse Communication: Mobility status PT Visit Diagnosis: Unsteadiness on feet (R26.81);Other abnormalities of gait and mobility (R26.89);Muscle weakness (generalized) (M62.81);Difficulty in walking, not elsewhere classified (R26.2)     Time: 1104-1130 PT Time Calculation (min) (ACUTE ONLY): 26 min  Charges:  $Gait Training: 23-37 mins                     Rolinda Roan, PT, DPT Acute Rehabilitation Services Pager: [phone] Office: [phone]    Thelma Comp 11/17/2021, 1:46 PM

## 2021-11-17 NOTE — Discharge Summary (Signed)
Physician Discharge Summary  Patient ID: Brandon Gallagher MRN: [id number] DOB/AGE: 06/10/1961 61 y.o.  Admit date: 11/15/2021 Discharge date: 11/17/2021  Admission Diagnoses: Lumbar stenosis with radiculopathy, recurrent herniated nucleus pulposus L2-L3  Discharge Diagnoses: Lumbar stenosis with radiculopathy, recurrent herniated nucleus pulposus L2-L3.  Degenerative disc disease.  Diabetes mellitus. Principal Problem:   Herniated nucleus pulposus, L2-3 left   Discharged Condition: good  Hospital Course: Patient was admitted to undergo surgical decompression and stabilization of the L2-L3 joint.  He tolerated surgery well.  Consults: None  Significant Diagnostic Studies: None  Treatments: surgery: See op note  Discharge Exam: Blood pressure 118/67, pulse 66, temperature 98.5 F (36.9 C), temperature source Oral, resp. rate 18, height 5\' 11"  (1.803 m), weight 124.3 kg, SpO2 100 %. Incision is clean and dry.  There is modest weakness in iliopsoas and quad on the left side compared to the right graded 4 out of 5.  Disposition: Discharge disposition: 01-Home or Self Care       Discharge Instructions     Call MD for:  redness, tenderness, or signs of infection (pain, swelling, redness, odor or green/yellow discharge around incision site)   Complete by: As directed    Call MD for:  severe uncontrolled pain   Complete by: As directed    Call MD for:  temperature >100.4   Complete by: As directed    Diet - low sodium heart healthy   Complete by: As directed    Discharge wound care:   Complete by: As directed    Okay to shower. Do not apply salves or appointments to incision. No heavy lifting with the upper extremities greater than 10 pounds. May resume driving when not requiring pain medication and patient feels comfortable with doing so.   Incentive spirometry RT   Complete by: As directed    Increase activity slowly   Complete by: As directed       Allergies as of  11/17/2021   No Known Allergies      Medication List     TAKE these medications    acetaminophen 500 MG tablet Commonly known as: TYLENOL Take 500-1,000 mg by mouth every 6 (six) hours as needed for moderate pain.   albuterol 108 (90 Base) MCG/ACT inhaler Commonly known as: VENTOLIN HFA Can inhale two puffs every four to six hours as needed for cough or wheeze.   ALIGN PREBIOTIC-PROBIOTIC PO Take 1 capsule by mouth daily.   ALPRAZolam 1 MG tablet Commonly known as: XANAX Take 1 mg by mouth 2 (two) times daily.   budesonide 0.5 MG/2ML nebulizer solution Commonly known as: PULMICORT Mix one ampule into 200-250 mL of distilled water/saline and irrigate nasal passages twice daily as directed.   famotidine 40 MG tablet Commonly known as: PEPCID Take one tablet by mouth one to two times daily as directed.   FIBER PO Take 4 tablets by mouth in the morning.   fluticasone 110 MCG/ACT inhaler Commonly known as: FLOVENT HFA Inhale 2 puffs into the lungs 2 (two) times daily as needed (for flare-ups. Rinse, gargle, and spit after use.).   gabapentin 300 MG capsule Commonly known as: NEURONTIN Take 1 capsule (300 mg total) by mouth at bedtime.   HYDROcodone-acetaminophen 5-325 MG tablet Commonly known as: NORCO/VICODIN Take 1 tablet by mouth every 4 (four) hours as needed for moderate pain.   melatonin 5 MG Tabs Take 20 mg by mouth at bedtime.   methocarbamol 500 MG tablet Commonly known as: ROBAXIN Take  1 tablet (500 mg total) by mouth every 6 (six) hours as needed for muscle spasms.   metoCLOPramide 5 MG tablet Commonly known as: REGLAN Take 1 tablet by mouth at bedtime   omeprazole 20 MG capsule Commonly known as: PRILOSEC Take 20 mg by mouth daily.   omeprazole 40 MG capsule Commonly known as: PRILOSEC Take 1 tablet 2 times per day   oxyCODONE-acetaminophen 5-325 MG tablet Commonly known as: PERCOCET/ROXICET Take 1-2 tablets by mouth every 4 (four) hours as  needed for moderate pain or severe pain.   solifenacin 10 MG tablet Commonly known as: VESICARE Take 10 mg by mouth daily.   Symbicort 160-4.5 MCG/ACT inhaler Generic drug: budesonide-formoterol Inhale 2 puffs into the lungs 1-2 times daily What changed: See the new instructions.   tamsulosin 0.4 MG Caps capsule Commonly known as: FLOMAX Take 0.4 mg by mouth in the morning and at bedtime.   triamcinolone 55 MCG/ACT Aero nasal inhaler Commonly known as: NASACORT Use 1 spray in each nostril once daily What changed:  how much to take how to take this when to take this reasons to take this additional instructions   venlafaxine XR 75 MG 24 hr capsule Commonly known as: EFFEXOR-XR Take 75 mg by mouth daily.   zolpidem 5 MG tablet Commonly known as: AMBIEN Take 5 mg by mouth at bedtime.               Durable Medical Equipment  (From admission, onward)           Start     Ordered   11/16/21 1215  For home use only DME Walker rolling  Once       Question Answer Comment  Walker: With 5 Inch Wheels   Patient needs a walker to treat with the following condition Weakness      11/16/21 1214   11/16/21 1215  For home use only DME 3 n 1  Once        11/16/21 1214              Discharge Care Instructions  (From admission, onward)           Start     Ordered   11/17/21 0000  Discharge wound care:       Comments: Okay to shower. Do not apply salves or appointments to incision. No heavy lifting with the upper extremities greater than 10 pounds. May resume driving when not requiring pain medication and patient feels comfortable with doing so.   11/17/21 1244             Signed: Shary Key Angle Karel 11/17/2021, 12:44 PM

## 2021-11-17 NOTE — Progress Notes (Signed)
Patient alert and oriented, mae's well, voiding adequate amount of urine, swallowing without difficulty, no c/o pain at time of discharge. Patient discharged home with family. Script and discharged instructions given to patient. Patient and family stated understanding of instructions given. Patient has an appointment with Dr. Ellene Route in 3 weeks. Patient waiting for ride for home with spouse

## 2021-11-18 NOTE — Op Note (Addendum)
Date of surgery: 11/15/2021 Preoperative diagnosis: Recurrent herniated nucleus pulposus L2-L3 left with left lumbar radiculopathy Postoperative diagnosis: Same Procedure: Hemilaminectomy and discectomy L2-3 left with decompression of the L2 and L3 nerve roots.  Transforaminal interbody arthrodesis using peek spacer local autograft allograft and Proteus.  Pedicle screw fixation L2-L3 with posterior and posterolateral arthrodesis using local autograft allograft and Proteus. Surgeon: Kristeen Miss First Assistant: Consuella Lose MD Anesthesia: General endotracheal Indications: Brandon Gallagher is a 61 year old individuals had significant back and left lower extremity pain and weakness during the year that he just passed the patient underwent a discectomy on 2 occasions at the L2-3 level on the left he then recurred a disc herniation the end of November and I advised that in addition to surgical decompression he should undergo arthrodesis of the joint as there was significant degeneration of the joint and substantial Modic changes that are appearing he is now admitted for that procedure.  Procedure: The patient was brought to the operating room supine on the stretcher.  After the smooth induction of general endotracheal anesthesia, he was carefully turned prone.  The back was prepped with Alcohol and DuraPrep and draped in a sterile fashion.  Midline incision was created and carried down to the lumbodorsal fascia the fascia was opened on either side of midline and on the left side dissection was carried down to the interlaminar space at L2-L3 where there was substantial scar tissue.  Radiographs were obtained to localize the disc space particularly and then a large laminotomy and facetectomy was performed at L2-3.  Scar tissue was released and the epidural space was released and then dissection was carried inferiorly to expose the path of the exiting L3 nerve root inferiorly.  Care was taken to mobilize the  common dural tube as there was substantial scar tissue and on the dorsal aspect of the scar durotomy occurred when releasing some of the scar tissue there. Dr. Kathyrn Sheriff  helped during this most intricate part of the surgery to dissect and retract the nerve roots and close the inadvertent durotomy.This was able to be closed by freeing the edges of the dura and using two 6-0 Prolene sutures with interrupted figure-of-eight sutures to close this dural tear.  Then the dura could be gradually mobilized further and underlying this a large disc herniation was encountered with multiple fragments of disc in the space was carefully dissected free to relieve pressure on the common dural tube and the takeoff of the L3 nerve root once this area was decompressed the disc space was then entered substantial quantity of moderately and severely degenerated disc material was encountered and the discectomy was performed using a series of curettes and rongeurs to reach across the disc space to release as much disc material as possible self-retaining spreader was used on the contralateral side and the interfacet space to distract this joint once the disc space was cleaned out and the endplates were decorticated sizer was used to fit an 8 mm TLIF spacer. With the help of Dr. Kathyrn Sheriff to provide adaquate exposure and retraction,  the spacer was filled with a combination of autograft allograft and Proteus.  Then a total of 12 cc of bone graft was fitted into the interspace along with the spacer.  The right subperiosteal dissection was then undertaken to expose the pedicle entry site at the of the L2-3 level on the right fluoroscopy was used to allow placement of pedicle probes in L2 and L3 and 6.5 x 45 mm screws were then  placed with fluoroscopic guidance.  Standard precontoured 40 mm rods were used by [place] to connect the screws in a neutral construct the interlaminar space was then packed with bone on the right side and the intertransverse  space was packed with bone on the left side for total volume of 12 cc of bone in this region.  The system was tightened in a neutral construct and final radiographs were obtained.  At this point a Valsalva maneuver was performed and no CSF leakage was noted.  The wound was checked for hemostasis and then the lumbodorsal fascia was closed with #1 Vicryl in interrupted fashion, 2-0 Vicryl in the subcutaneous tissues, 3-0 Vicryl subcuticularly.This was all done with the invaluable help of Dr. Kathyrn Sheriff.  Blood loss was estimated 300 cc.  Patient tolerated procedure well is returned to recovery room in stable condition.  25 cc of half percent Marcaine was injected into the paraspinous fascia at the time of closure.

## 2021-12-07 DIAGNOSIS — M5127 Other intervertebral disc displacement, lumbosacral region: Secondary | ICD-10-CM | POA: Diagnosis not present

## 2022-01-16 DIAGNOSIS — E119 Type 2 diabetes mellitus without complications: Secondary | ICD-10-CM | POA: Diagnosis not present

## 2022-01-18 DIAGNOSIS — M5127 Other intervertebral disc displacement, lumbosacral region: Secondary | ICD-10-CM | POA: Diagnosis not present

## 2022-02-03 ENCOUNTER — Other Ambulatory Visit: Payer: Self-pay | Admitting: *Deleted

## 2022-02-03 ENCOUNTER — Ambulatory Visit: Payer: Medicare HMO | Admitting: Sports Medicine

## 2022-02-15 DIAGNOSIS — G43909 Migraine, unspecified, not intractable, without status migrainosus: Secondary | ICD-10-CM | POA: Diagnosis not present

## 2022-02-15 DIAGNOSIS — M542 Cervicalgia: Secondary | ICD-10-CM | POA: Diagnosis not present

## 2022-02-20 ENCOUNTER — Telehealth: Payer: Self-pay | Admitting: Allergy and Immunology

## 2022-02-20 NOTE — Telephone Encounter (Signed)
Patient states [facility] does not have the budesonide in stock. Before sending in the refill to another pharmacy he wanted to let Dr. Neldon Mc know that he's not 100% sure he's getting the full benefit from these washes because he has a hole in his nose which causes some of the solution to go down his throat. He's wondering if he would need to try another medication or should he wait to discuss this at his OV on 4/13.  ?

## 2022-02-20 NOTE — Telephone Encounter (Signed)
Patient informed. 

## 2022-02-20 NOTE — Telephone Encounter (Signed)
Please advice . Thank you

## 2022-02-23 ENCOUNTER — Encounter: Payer: Self-pay | Admitting: Allergy and Immunology

## 2022-02-23 ENCOUNTER — Ambulatory Visit (INDEPENDENT_AMBULATORY_CARE_PROVIDER_SITE_OTHER): Payer: Medicare HMO | Admitting: Allergy and Immunology

## 2022-02-23 VITALS — BP 126/82 | HR 96 | Resp 16

## 2022-02-23 DIAGNOSIS — J324 Chronic pansinusitis: Secondary | ICD-10-CM | POA: Diagnosis not present

## 2022-02-23 DIAGNOSIS — D839 Common variable immunodeficiency, unspecified: Secondary | ICD-10-CM

## 2022-02-23 DIAGNOSIS — K219 Gastro-esophageal reflux disease without esophagitis: Secondary | ICD-10-CM | POA: Diagnosis not present

## 2022-02-23 DIAGNOSIS — J3489 Other specified disorders of nose and nasal sinuses: Secondary | ICD-10-CM | POA: Diagnosis not present

## 2022-02-23 DIAGNOSIS — D802 Selective deficiency of immunoglobulin A [IgA]: Secondary | ICD-10-CM

## 2022-02-23 DIAGNOSIS — J4540 Moderate persistent asthma, uncomplicated: Secondary | ICD-10-CM | POA: Diagnosis not present

## 2022-02-23 NOTE — Patient Instructions (Addendum)
?  1.  Continue immunoglobulin infusions  ? ?2.  Continue to treat and prevent inflammation: ? ? A. Symbicort 160- 2 inhalations 1-2 times per day    ? ?3.  If needed: ? ? A.  Nasal saline ? B.  Budesonide washes 1-2 times per day ? C.  Albuterol HFA-2 inhalations every 4-6 hours ? ?4.  Continue to treat reflux with the following: ? ? A.  Omeprazole 40 mg - 1 tablet 2 times per day  ? B.  Famotidine 40 mg - 1 tablet 2 times per day ? C.  Metoclopramide 5 mg -1 tablet at bedtime ? D.  Magnesium based antacids ? ?5.  Add Flovent 110 - 2 inhalations 2 times per day to Symbicort during increased asthma activity ? ?7. Return to clinic in 6 months or earlier if problem ? ? ? ?

## 2022-02-23 NOTE — Progress Notes (Signed)
? ?Brandon Gallagher ? ? ?Follow-up Note ? ?Referring Provider: Nicoletta Dress, MD ?Primary Provider: Nicoletta Dress, MD ?Date of Office Visit: 02/23/2022 ? ?Subjective:  ? ?Brandon Gallagher (DOB: 02-Dec-1960) is a 61 y.o. male who returns to the [facility] on 02/23/2022 in re-evaluation of the following: ? 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  His last visit to this clinic was 24 August 2021. ? ?He has not had any infections since I have seen him in this clinic 6 months ago.  He has had very little issues with his upper airways.  He has not been using his budesonide washes recently.  He has very little issues with asthma.  He does not need to use a short acting bronchodilator.  He has not required a systemic steroid or an antibiotic for any type of airway issue. ? ?He still continues to have very significant reflux.  If he is not careful about eating he can sometimes develop regurgitation and very significant burning and pain with the wrong types of spicy foods.  But overall, he is pretty satisfied with the response he is receiving on his current therapy. ? ?He has had 3 back surgeries with his last being 16 November 2021 with a very good result with his last surgery. ? ?Allergies as of 02/23/2022   ?No Known Allergies ?  ? ?  ?Medication List  ? ? ?Accu-Chek Guide Me w/Device Kit ?1 (one) Kit daily ?  ?Accu-Chek Guide test strip ?Generic drug: glucose blood ?1 (one) Each in vitro daily ?  ?acetaminophen 500 MG tablet ?Commonly known as: TYLENOL ?Take 500-1,000 mg by mouth every 6 (six) hours as needed for moderate pain. ?  ?GoodSense Arthritis Pain 650 MG CR tablet ?Generic drug: acetaminophen ?SMARTSIG:1 Tablet(s) By Mouth ?  ?albuterol 108 (90 Base) MCG/ACT inhaler ?Commonly known as: VENTOLIN HFA ?Can inhale two puffs every four to six hours as needed for cough or  wheeze. ?  ?ALIGN PREBIOTIC-PROBIOTIC PO ?Take 1 capsule by mouth daily. ?  ?ALPRAZolam 1 MG tablet ?Commonly known as: Duanne Moron ?Take 1 mg by mouth 2 (two) times daily. ?  ?famotidine 40 MG tablet ?Commonly known as: PEPCID ?Take one tablet by mouth one to two times daily as directed. ?  ?FIBER PO ?Take 4 tablets by mouth in the morning. ?  ?Flulaval Quadrivalent 0.5 ML injection ?Generic drug: influenza vac split quadrivalent PF ?  ?fluticasone 110 MCG/ACT inhaler ?Commonly known as: FLOVENT HFA ?Inhale 2 puffs into the lungs 2 (two) times daily as needed (for flare-ups. Rinse, gargle, and spit after use.). ?  ?gabapentin 300 MG capsule ?Commonly known as: NEURONTIN ?Take 1 capsule (300 mg total) by mouth at bedtime. ?  ?Geri-Dryl 25 MG tablet ?Generic drug: diphenhydrAMINE ?SMARTSIG:2 Tablet(s) By Mouth ?  ?Hizentra 10 GM/50ML Soln ?Generic drug: Immune Globulin (Human) ?Inject into the skin. ?  ?HYDROcodone-acetaminophen 5-325 MG tablet ?Commonly known as: NORCO/VICODIN ?Take 1 tablet by mouth every 4 (four) hours as needed for moderate pain. ?  ?levofloxacin 750 MG tablet ?Commonly known as: LEVAQUIN ?Take 750 mg by mouth daily. ?  ?melatonin 5 MG Tabs ?Take 20 mg by mouth at bedtime. ?  ?methocarbamol 500 MG tablet ?Commonly known as: ROBAXIN ?Take 1 tablet (500 mg total) by mouth every 6 (six) hours as needed for muscle spasms. ?  ?metoCLOPramide 5  MG tablet ?Commonly known as: REGLAN ?Take 1 tablet by mouth at bedtime ?  ?nystatin powder ?Commonly known as: MYCOSTATIN/NYSTOP ?Apply topically 3 (three) times daily. ?  ?omeprazole 20 MG capsule ?Commonly known as: PRILOSEC ?Take 20 mg by mouth daily. ?  ?omeprazole 40 MG capsule ?Commonly known as: PRILOSEC ?Take 1 tablet 2 times per day ?  ?oxyCODONE-acetaminophen 5-325 MG tablet ?Commonly known as: PERCOCET/ROXICET ?Take 1-2 tablets by mouth every 4 (four) hours as needed for moderate pain or severe pain. ?  ?Prevnar 20 0.5 ML injection ?Generic drug:  pneumococcal 20-valent conjugate vaccine ?  ?solifenacin 10 MG tablet ?Commonly known as: VESICARE ?Take 10 mg by mouth daily. ?  ?SUMAtriptan 100 MG tablet ?Commonly known as: IMITREX ?Take by mouth. ?  ?Symbicort 160-4.5 MCG/ACT inhaler ?Generic drug: budesonide-formoterol ?Inhale 2 puffs into the lungs 1-2 times daily ?  ?tamsulosin 0.4 MG Caps capsule ?Commonly known as: FLOMAX ?Take 0.4 mg by mouth in the morning and at bedtime. ?  ?Ubrelvy 100 MG Tabs ?Generic drug: Ubrogepant ?Take by mouth. ?  ?venlafaxine XR 75 MG 24 hr capsule ?Commonly known as: EFFEXOR-XR ?Take 75 mg by mouth daily. ?  ?zolpidem 5 MG tablet ?Commonly known as: AMBIEN ?Take 5 mg by mouth at bedtime. ?  ? ? ?Past Medical History:  ?Diagnosis Date  ? Acute respiratory disease due to COVID-19 virus 10/30/2019  ? Acute respiratory failure with hypoxia ([place]) 10/30/2019  ? ADHD (attention deficit hyperactivity disorder)   ? diagnosed as a teenager  ? Allergy   ? Anxiety   ? Arthritis   ? Asthma   ? Blood transfusion without reported diagnosis   ? Bronchitis   ? last had back in 2017   Sees no pulmonary md  ? Cervical spondylosis with radiculopathy 01/01/2017  ? Chronic bilateral low back pain without sciatica 01/10/2016  ? COPD (chronic obstructive pulmonary disease) ([place])   ? Depression   ? Diabetes mellitus without complication ([place]) 9357  ? Diverticulitis of sigmoid colon 02/05/2020  ? Diverticulosis   ? last flare up was in Jan. 2018  Was on course of antibiotics for this  ? Esophageal stricture   ? Esophagus disorder   ? narrowing of esophagus.  Had to have dilatation 09/2016  ? Fall 01/10/2016  ? Family history of adverse reaction to anesthesia   ? son had severe N/V with his knee surgery  ? Gastric polyp   ? GERD (gastroesophageal reflux disease)   ? Headache   ? History of Clostridioides difficile colitis   ? History of colon polyps   ? IBS (irritable bowel syndrome)   ? 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 Morbid obesity due to excess  calories ([place]) 07/28/2015  ? OSA (obstructive sleep apnea) 10/06/2015  ? Pain in joint, ankle and foot 11/22/2018  ? Right:  ? Presbyesophagus   ? S/P lumbar laminectomy 11/01/2015  ? Overview:  AXIALIF L5-S1 with percutaneous robot assisted pedicle scre and rod instrumentation on 08/20/15- Dr. Elnoria Howard   Overview:  S/P minimally invasive lumbar laminectomy L3-4 performed on 10/08/2015 by Dr. Elnoria Howard   ? Sacroiliac joint pain 07/27/2016  ? Sinus bradycardia by electrocardiogram 10/30/2019  ? Sleep apnea   ? no CPAP- unsure if true diagnosis or not  ? Thrombosed external hemorrhoid   ? ? ?Past Surgical History:  ?Procedure Laterality Date  ? ANTERIOR CERVICAL DECOMP/DISCECTOMY FUSION N/A 01/01/2017  ? Procedure: Cervical four- five and Cervical six- seven Anterior cervical discectomy with fusion  and plate fixation;  Surgeon: Kevan Ny Ditty, MD;  Location: [place];  Service: Neurosurgery;  Laterality: N/A;  C4-5 and C6-7 Anterior cervical discectomy with fusion and plate fixation  ? BACK SURGERY    ? x 2    @ [facility]        Last one 09/2015  ? CARDIAC CATHETERIZATION    ? He thinks.   Not really sure where at, thought it was here @ [facility], but its been a LONG time ?2000  ? CARPAL TUNNEL RELEASE Bilateral   ? COLECTOMY    ? COLONOSCOPY  10/22/2017  ? Colonic polyp status post polypectomy. Moderate predominantly sigmoid diverticulosis.   ? COLONOSCOPY  2019  ? Dr Orlena Sheldon  ? ESOPHAGOGASTRODUODENOSCOPY  06/08/2016  ? Distal esophageal stricture status post esophageal dilatation. Presbtesophagus, also suggestive of motility disorder. Mild gastritis.   ? evacuation thrombosed external hemorrhoid  2021  ? KNEE SURGERY Right   ? LUMBAR LAMINECTOMY  10/08/2015  ? L3-4, Surgeon Deirdre Evener, MD  ? NASAL SINUS SURGERY    ? HAS HAD 2 OR 3.   LAST ONE 2011, IN [place]  (LEFT NASAL PASSAGE IS NARROWER THAN RIGHT  ? SACROILIAC JOINT INJECTION Bilateral 01/10/2016  ? UPPER GASTROINTESTINAL ENDOSCOPY    ? ? ?Review of systems  negative except as noted in HPI / PMHx or noted below: ? ?Review of Systems  ?Constitutional: Negative.   ?HENT: Negative.    ?Eyes: Negative.   ?Respiratory: Negative.    ?Cardiovascular: Negative.   ?Gastrointestinal:

## 2022-02-24 ENCOUNTER — Telehealth: Payer: Self-pay

## 2022-02-24 NOTE — Telephone Encounter (Signed)
Patient called in - DOB verified - advising that he was informed there is a Tree surgeon on the Budesonide washes  - can the provider suggest anything else. ? ?Advised message will be forwarded to provided. ? ?Patient can be reached at [phone]. ?

## 2022-02-25 DIAGNOSIS — G4700 Insomnia, unspecified: Secondary | ICD-10-CM | POA: Diagnosis not present

## 2022-02-25 DIAGNOSIS — K219 Gastro-esophageal reflux disease without esophagitis: Secondary | ICD-10-CM | POA: Diagnosis not present

## 2022-02-25 DIAGNOSIS — Z6838 Body mass index (BMI) 38.0-38.9, adult: Secondary | ICD-10-CM | POA: Diagnosis not present

## 2022-02-25 DIAGNOSIS — F419 Anxiety disorder, unspecified: Secondary | ICD-10-CM | POA: Diagnosis not present

## 2022-02-25 DIAGNOSIS — E1169 Type 2 diabetes mellitus with other specified complication: Secondary | ICD-10-CM | POA: Diagnosis not present

## 2022-02-25 DIAGNOSIS — E785 Hyperlipidemia, unspecified: Secondary | ICD-10-CM | POA: Diagnosis not present

## 2022-02-25 DIAGNOSIS — G4733 Obstructive sleep apnea (adult) (pediatric): Secondary | ICD-10-CM | POA: Diagnosis not present

## 2022-02-25 DIAGNOSIS — N401 Enlarged prostate with lower urinary tract symptoms: Secondary | ICD-10-CM | POA: Diagnosis not present

## 2022-02-27 ENCOUNTER — Encounter: Payer: Self-pay | Admitting: Allergy and Immunology

## 2022-02-27 MED ORDER — BUDESONIDE 1 MG/2ML IN SUSP
RESPIRATORY_TRACT | 5 refills | Status: AC
Start: 2022-02-27 — End: ?

## 2022-02-27 NOTE — Telephone Encounter (Signed)
Rx for budesonide 1 mg solution has been sent to [facility]. Left a message to informing patient.  ?

## 2022-02-27 NOTE — Telephone Encounter (Signed)
We need budesonide 0.5 mg AMPULES that dave will mix with saline. Are the ampules on back order? ?

## 2022-02-27 NOTE — Addendum Note (Signed)
Addended by: Guy Franco on: 02/27/2022 04:22 PM ? ? Modules accepted: Orders ? ?

## 2022-02-27 NOTE — Telephone Encounter (Signed)
I spoke with [facility] Budesonide 0.5 mg is on backorder and they do not have a date of when it will be available again to order. They are able to order Budesonide 1 mg. Please advice ?

## 2022-03-03 ENCOUNTER — Ambulatory Visit: Payer: Medicare HMO | Admitting: Sports Medicine

## 2022-03-08 DIAGNOSIS — K644 Residual hemorrhoidal skin tags: Secondary | ICD-10-CM | POA: Diagnosis not present

## 2022-03-12 DIAGNOSIS — N185 Chronic kidney disease, stage 5: Secondary | ICD-10-CM | POA: Diagnosis not present

## 2022-03-12 DIAGNOSIS — I120 Hypertensive chronic kidney disease with stage 5 chronic kidney disease or end stage renal disease: Secondary | ICD-10-CM | POA: Diagnosis not present

## 2022-03-12 DIAGNOSIS — J449 Chronic obstructive pulmonary disease, unspecified: Secondary | ICD-10-CM | POA: Diagnosis not present

## 2022-03-18 DIAGNOSIS — M19071 Primary osteoarthritis, right ankle and foot: Secondary | ICD-10-CM | POA: Diagnosis not present

## 2022-03-18 DIAGNOSIS — M25571 Pain in right ankle and joints of right foot: Secondary | ICD-10-CM | POA: Diagnosis not present

## 2022-03-18 DIAGNOSIS — G8929 Other chronic pain: Secondary | ICD-10-CM | POA: Diagnosis not present

## 2022-03-21 DIAGNOSIS — R5383 Other fatigue: Secondary | ICD-10-CM | POA: Diagnosis not present

## 2022-03-21 DIAGNOSIS — R400 Somnolence: Secondary | ICD-10-CM | POA: Diagnosis not present

## 2022-03-21 DIAGNOSIS — G4733 Obstructive sleep apnea (adult) (pediatric): Secondary | ICD-10-CM | POA: Diagnosis not present

## 2022-03-21 DIAGNOSIS — J4540 Moderate persistent asthma, uncomplicated: Secondary | ICD-10-CM | POA: Diagnosis not present

## 2022-04-12 DIAGNOSIS — M7531 Calcific tendinitis of right shoulder: Secondary | ICD-10-CM | POA: Diagnosis not present

## 2022-04-21 DIAGNOSIS — R400 Somnolence: Secondary | ICD-10-CM | POA: Diagnosis not present

## 2022-04-21 DIAGNOSIS — R5383 Other fatigue: Secondary | ICD-10-CM | POA: Diagnosis not present

## 2022-04-21 DIAGNOSIS — J4540 Moderate persistent asthma, uncomplicated: Secondary | ICD-10-CM | POA: Diagnosis not present

## 2022-04-21 DIAGNOSIS — G4733 Obstructive sleep apnea (adult) (pediatric): Secondary | ICD-10-CM | POA: Diagnosis not present

## 2022-04-25 ENCOUNTER — Telehealth: Payer: Self-pay | Admitting: Allergy and Immunology

## 2022-04-25 NOTE — Telephone Encounter (Signed)
Dr. Kozlow please advice. Thank you 

## 2022-04-25 NOTE — Telephone Encounter (Signed)
Patient has been notified and thanks you for the information. He plans to monitor his symptoms and make an appointment if symptoms do not improve.

## 2022-04-25 NOTE — Telephone Encounter (Signed)
Patient states he is having some chest tightness and his nose has been stuffy for a couple of days. He mentioned Dr. Neldon Mc decreased his Symbicort to 1 puff instead of 2 puffs. He is wondering if he needs to increase it or does he need to take something else.

## 2022-05-02 DIAGNOSIS — R062 Wheezing: Secondary | ICD-10-CM | POA: Diagnosis not present

## 2022-05-23 DIAGNOSIS — J019 Acute sinusitis, unspecified: Secondary | ICD-10-CM | POA: Diagnosis not present

## 2022-05-23 DIAGNOSIS — J208 Acute bronchitis due to other specified organisms: Secondary | ICD-10-CM | POA: Diagnosis not present

## 2022-05-23 DIAGNOSIS — B9689 Other specified bacterial agents as the cause of diseases classified elsewhere: Secondary | ICD-10-CM | POA: Diagnosis not present

## 2022-05-24 DIAGNOSIS — G4733 Obstructive sleep apnea (adult) (pediatric): Secondary | ICD-10-CM | POA: Diagnosis not present

## 2022-05-30 ENCOUNTER — Telehealth: Payer: Self-pay | Admitting: Allergy and Immunology

## 2022-05-30 NOTE — Telephone Encounter (Signed)
Please advise 

## 2022-05-30 NOTE — Telephone Encounter (Signed)
Patient states he developed bronchitis and a sinus infection last Thursday (7/13). He is currently taking prednisone and is still having chest tightness and difficulty breathing. He was wondering if he could still get his infusion on Thursday.

## 2022-05-31 NOTE — Telephone Encounter (Signed)
Patient called and said that the pharmacy answer his question so he dont need the nurse calling him back

## 2022-06-02 DIAGNOSIS — G4733 Obstructive sleep apnea (adult) (pediatric): Secondary | ICD-10-CM | POA: Diagnosis not present

## 2022-06-02 DIAGNOSIS — R5383 Other fatigue: Secondary | ICD-10-CM | POA: Diagnosis not present

## 2022-06-02 DIAGNOSIS — J4540 Moderate persistent asthma, uncomplicated: Secondary | ICD-10-CM | POA: Diagnosis not present

## 2022-06-02 DIAGNOSIS — R400 Somnolence: Secondary | ICD-10-CM | POA: Diagnosis not present

## 2022-06-03 DIAGNOSIS — K219 Gastro-esophageal reflux disease without esophagitis: Secondary | ICD-10-CM | POA: Diagnosis not present

## 2022-06-03 DIAGNOSIS — F5104 Psychophysiologic insomnia: Secondary | ICD-10-CM | POA: Diagnosis not present

## 2022-06-03 DIAGNOSIS — F419 Anxiety disorder, unspecified: Secondary | ICD-10-CM | POA: Diagnosis not present

## 2022-06-03 DIAGNOSIS — E785 Hyperlipidemia, unspecified: Secondary | ICD-10-CM | POA: Diagnosis not present

## 2022-06-03 DIAGNOSIS — G4733 Obstructive sleep apnea (adult) (pediatric): Secondary | ICD-10-CM | POA: Diagnosis not present

## 2022-06-03 DIAGNOSIS — Z6836 Body mass index (BMI) 36.0-36.9, adult: Secondary | ICD-10-CM | POA: Diagnosis not present

## 2022-06-03 DIAGNOSIS — E1169 Type 2 diabetes mellitus with other specified complication: Secondary | ICD-10-CM | POA: Diagnosis not present

## 2022-06-03 DIAGNOSIS — N401 Enlarged prostate with lower urinary tract symptoms: Secondary | ICD-10-CM | POA: Diagnosis not present

## 2022-06-12 DIAGNOSIS — K649 Unspecified hemorrhoids: Secondary | ICD-10-CM | POA: Diagnosis not present

## 2022-06-12 DIAGNOSIS — K222 Esophageal obstruction: Secondary | ICD-10-CM | POA: Diagnosis not present

## 2022-06-12 DIAGNOSIS — K5790 Diverticulosis of intestine, part unspecified, without perforation or abscess without bleeding: Secondary | ICD-10-CM | POA: Diagnosis not present

## 2022-06-12 DIAGNOSIS — E1169 Type 2 diabetes mellitus with other specified complication: Secondary | ICD-10-CM | POA: Diagnosis not present

## 2022-06-12 DIAGNOSIS — K219 Gastro-esophageal reflux disease without esophagitis: Secondary | ICD-10-CM | POA: Diagnosis not present

## 2022-06-12 DIAGNOSIS — R109 Unspecified abdominal pain: Secondary | ICD-10-CM | POA: Diagnosis not present

## 2022-06-12 DIAGNOSIS — E785 Hyperlipidemia, unspecified: Secondary | ICD-10-CM | POA: Diagnosis not present

## 2022-06-12 DIAGNOSIS — R198 Other specified symptoms and signs involving the digestive system and abdomen: Secondary | ICD-10-CM | POA: Diagnosis not present

## 2022-06-17 DIAGNOSIS — J208 Acute bronchitis due to other specified organisms: Secondary | ICD-10-CM | POA: Diagnosis not present

## 2022-07-08 DIAGNOSIS — L729 Follicular cyst of the skin and subcutaneous tissue, unspecified: Secondary | ICD-10-CM | POA: Diagnosis not present

## 2022-07-26 DIAGNOSIS — L570 Actinic keratosis: Secondary | ICD-10-CM | POA: Diagnosis not present

## 2022-07-26 DIAGNOSIS — D2239 Melanocytic nevi of other parts of face: Secondary | ICD-10-CM | POA: Diagnosis not present

## 2022-07-26 DIAGNOSIS — L820 Inflamed seborrheic keratosis: Secondary | ICD-10-CM | POA: Diagnosis not present

## 2022-07-26 DIAGNOSIS — L821 Other seborrheic keratosis: Secondary | ICD-10-CM | POA: Diagnosis not present

## 2022-07-26 DIAGNOSIS — D485 Neoplasm of uncertain behavior of skin: Secondary | ICD-10-CM | POA: Diagnosis not present

## 2022-08-09 DIAGNOSIS — K222 Esophageal obstruction: Secondary | ICD-10-CM | POA: Diagnosis not present

## 2022-08-09 DIAGNOSIS — K219 Gastro-esophageal reflux disease without esophagitis: Secondary | ICD-10-CM | POA: Diagnosis not present

## 2022-08-09 DIAGNOSIS — K5900 Constipation, unspecified: Secondary | ICD-10-CM | POA: Diagnosis not present

## 2022-08-09 DIAGNOSIS — K582 Mixed irritable bowel syndrome: Secondary | ICD-10-CM | POA: Diagnosis not present

## 2022-08-09 DIAGNOSIS — K5790 Diverticulosis of intestine, part unspecified, without perforation or abscess without bleeding: Secondary | ICD-10-CM | POA: Diagnosis not present

## 2022-08-11 DIAGNOSIS — R5383 Other fatigue: Secondary | ICD-10-CM | POA: Diagnosis not present

## 2022-08-11 DIAGNOSIS — R400 Somnolence: Secondary | ICD-10-CM | POA: Diagnosis not present

## 2022-08-11 DIAGNOSIS — G4733 Obstructive sleep apnea (adult) (pediatric): Secondary | ICD-10-CM | POA: Diagnosis not present

## 2022-08-11 DIAGNOSIS — J4540 Moderate persistent asthma, uncomplicated: Secondary | ICD-10-CM | POA: Diagnosis not present

## 2022-08-14 ENCOUNTER — Other Ambulatory Visit: Payer: Self-pay | Admitting: Allergy and Immunology

## 2022-08-14 DIAGNOSIS — D830 Common variable immunodeficiency with predominant abnormalities of B-cell numbers and function: Secondary | ICD-10-CM

## 2022-08-15 DIAGNOSIS — L570 Actinic keratosis: Secondary | ICD-10-CM | POA: Diagnosis not present

## 2022-08-15 DIAGNOSIS — D485 Neoplasm of uncertain behavior of skin: Secondary | ICD-10-CM | POA: Diagnosis not present

## 2022-08-15 DIAGNOSIS — L720 Epidermal cyst: Secondary | ICD-10-CM | POA: Diagnosis not present

## 2022-08-19 DIAGNOSIS — R6889 Other general symptoms and signs: Secondary | ICD-10-CM | POA: Diagnosis not present

## 2022-08-19 DIAGNOSIS — U071 COVID-19: Secondary | ICD-10-CM | POA: Diagnosis not present

## 2022-08-19 DIAGNOSIS — R058 Other specified cough: Secondary | ICD-10-CM | POA: Diagnosis not present

## 2022-08-22 ENCOUNTER — Telehealth: Payer: Self-pay | Admitting: *Deleted

## 2022-08-22 NOTE — Telephone Encounter (Signed)
Patient called with questions regarding his hizentra infusion while having COVID. I called patient back yesterday and l/m for him to contact me. He did call back today and per Dr Neldon Mc advised him to take his next Hizentra infusion. Due to recurrent infections while on therapy appt made to see Dr Neldon Mc next Thrusday. Patient is taking paxlovid for his covid

## 2022-08-28 ENCOUNTER — Ambulatory Visit: Payer: Medicare HMO | Admitting: Allergy and Immunology

## 2022-08-31 ENCOUNTER — Ambulatory Visit (INDEPENDENT_AMBULATORY_CARE_PROVIDER_SITE_OTHER): Payer: Medicare HMO | Admitting: Allergy and Immunology

## 2022-08-31 ENCOUNTER — Encounter: Payer: Self-pay | Admitting: Allergy and Immunology

## 2022-08-31 VITALS — BP 140/86 | HR 99 | Resp 16

## 2022-08-31 DIAGNOSIS — D802 Selective deficiency of immunoglobulin A [IgA]: Secondary | ICD-10-CM

## 2022-08-31 DIAGNOSIS — D839 Common variable immunodeficiency, unspecified: Secondary | ICD-10-CM | POA: Diagnosis not present

## 2022-08-31 DIAGNOSIS — J4540 Moderate persistent asthma, uncomplicated: Secondary | ICD-10-CM

## 2022-08-31 DIAGNOSIS — K219 Gastro-esophageal reflux disease without esophagitis: Secondary | ICD-10-CM

## 2022-08-31 DIAGNOSIS — D801 Nonfamilial hypogammaglobulinemia: Secondary | ICD-10-CM

## 2022-08-31 MED ORDER — AIRSUPRA 90-80 MCG/ACT IN AERO: 2.0000 | INHALATION_SPRAY | RESPIRATORY_TRACT | 5 refills | Status: DC

## 2022-08-31 NOTE — Progress Notes (Signed)
[place]   Follow-up Note  Referring Provider: Nicoletta Dress, MD Primary Provider: Nicoletta Dress, MD Date of Office Visit: 08/31/2022  Subjective:   Brandon Gallagher (DOB: 24-Jun-1961) is a 61 y.o. 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  His last visit to this clinic was 24 August 2021.  He tells me that he has had 3 upper respiratory tract infections and one of them was with "bronchitis".  Each 1 of these infections was associated with green nasal discharge and it sounds as though he was treated with Levaquin on 2 occasions.  He went to the urgent care center for his last infection about 3 weeks ago but this was actually associated with COVID.  He still occasionally has cough.  He has been using his Symbicort twice a day and has added in Flovent for the past few weeks and uses his albuterol on a daily basis.  For some reason he stopped using his budesonide nasal washes in June 2023.  He thinks that his reflux is under okay control at this point in time while using a proton pump inhibitor and H2 receptor blocker.  He rarely uses his metoclopramide.  He does use magnesium based antacids.  Allergies as of 08/31/2022   No Known Allergies      Medication List    Accu-Chek Guide Me w/Device Kit 1 (one) Kit daily   Accu-Chek Guide test strip Generic drug: glucose blood 1 (one) Each in vitro daily   acetaminophen 500 MG tablet Commonly known as: TYLENOL Take 500-1,000 mg by mouth every 6 (six) hours as needed for moderate pain.   GoodSense Arthritis Pain 650 MG CR tablet Generic drug: acetaminophen SMARTSIG:1 Tablet(s) By Mouth   albuterol 108 (90 Base) MCG/ACT inhaler Commonly known as: VENTOLIN HFA Can inhale two puffs every four to six hours as needed  for cough or wheeze.   ALIGN PREBIOTIC-PROBIOTIC PO Take 1 capsule by mouth daily.   ALPRAZolam 1 MG tablet Commonly known as: XANAX Take 1 mg by mouth 2 (two) times daily.   budesonide 1 MG/2ML nebulizer solution Commonly known as: PULMICORT Mix one ampule into 200-250 mL of distilled water/saline and irrigate nasal passages twice daily as directed.   famotidine 40 MG tablet Commonly known as: PEPCID Take one tablet by mouth one to two times daily as directed.   FIBER PO Take 4 tablets by mouth in the morning.   Flulaval Quadrivalent 0.5 ML injection Generic drug: influenza vac split quadrivalent PF   fluticasone 110 MCG/ACT inhaler Commonly known as: FLOVENT HFA Inhale 2 puffs into the lungs 2 (two) times daily as needed (for flare-ups. Rinse, gargle, and spit after use.).   gabapentin 300 MG capsule Commonly known as: NEURONTIN Take 1 capsule (300 mg total) by mouth at bedtime.   Geri-Dryl 25 MG tablet Generic drug: diphenhydrAMINE SMARTSIG:2 Tablet(s) By Mouth   Hizentra 10 GM/50ML Soln Generic drug: Immune Globulin (Human) INFUSE 20g SUBCUTANEOUSLY EVERY OTHER WEEK   HYDROcodone-acetaminophen 5-325 MG tablet Commonly known as: NORCO/VICODIN Take 1 tablet by mouth every 4 (four) hours as needed for moderate pain.   levofloxacin 750 MG tablet Commonly known as: LEVAQUIN Take 750 mg by mouth daily.   LORazepam 1 MG tablet Commonly known as: ATIVAN SMARTSIG:1 Tablet(s) By  Mouth Every Evening   melatonin 5 MG Tabs Take 20 mg by mouth at bedtime.   methocarbamol 500 MG tablet Commonly known as: ROBAXIN Take 1 tablet (500 mg total) by mouth every 6 (six) hours as needed for muscle spasms.   metoCLOPramide 5 MG tablet Commonly known as: REGLAN Take 1 tablet by mouth at bedtime   nystatin powder Commonly known as: MYCOSTATIN/NYSTOP Apply topically 3 (three) times daily.   omeprazole 20 MG capsule Commonly known as: PRILOSEC Take 20 mg by mouth daily.    omeprazole 40 MG capsule Commonly known as: PRILOSEC Take 1 tablet 2 times per day   oxyCODONE-acetaminophen 5-325 MG tablet Commonly known as: PERCOCET/ROXICET Take 1-2 tablets by mouth every 4 (four) hours as needed for moderate pain or severe pain.   Prevnar 20 0.5 ML injection Generic drug: pneumococcal 20-valent conjugate vaccine   solifenacin 10 MG tablet Commonly known as: VESICARE Take 10 mg by mouth daily.   SUMAtriptan 100 MG tablet Commonly known as: IMITREX Take by mouth.   Symbicort 160-4.5 MCG/ACT inhaler Generic drug: budesonide-formoterol Inhale 2 puffs into the lungs 1-2 times daily What changed: See the new instructions.   tamsulosin 0.4 MG Caps capsule Commonly known as: FLOMAX Take 0.4 mg by mouth in the morning and at bedtime.   Ubrelvy 100 MG Tabs Generic drug: Ubrogepant Take by mouth.   venlafaxine XR 75 MG 24 hr capsule Commonly known as: EFFEXOR-XR Take 75 mg by mouth daily.   zolpidem 5 MG tablet Commonly known as: AMBIEN Take 5 mg by mouth at bedtime.    Past Medical History:  Diagnosis Date   Acute respiratory disease due to COVID-19 virus 10/30/2019   Acute respiratory failure with hypoxia ([place]) 10/30/2019   ADHD (attention deficit hyperactivity disorder)    diagnosed as a teenager   Allergy    Anxiety    Arthritis    Asthma    Blood transfusion without reported diagnosis    Bronchitis    last had back in 2017   Sees no pulmonary md   Cervical spondylosis with radiculopathy 01/01/2017   Chronic bilateral low back pain without sciatica 01/10/2016   COPD (chronic obstructive pulmonary disease) ([place])    Depression    Diabetes mellitus without complication ([place]) 1610   Diverticulitis of sigmoid colon 02/05/2020   Diverticulosis    last flare up was in Jan. 2018  Was on course of antibiotics for this   Esophageal stricture    Esophagus disorder    narrowing of esophagus.  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 Elnoria Howard   Overview:  S/P minimally invasive lumbar laminectomy L3-4 performed on 10/08/2015 by Dr. Elnoria Howard    Sacroiliac joint pain 07/27/2016   Sinus bradycardia by electrocardiogram 10/30/2019  Sleep apnea    no CPAP- unsure if true diagnosis or not   Thrombosed external hemorrhoid     Past Surgical History:  Procedure Laterality Date   ANTERIOR CERVICAL DECOMP/DISCECTOMY FUSION N/A 01/01/2017   Procedure: Cervical four- five and Cervical six- seven Anterior cervical discectomy with fusion and plate fixation;  Surgeon: Kevan Ny Ditty, MD;  Location: [facility];  Service: Neurosurgery;  Laterality: N/A;  C4-5 and C6-7 Anterior cervical discectomy with fusion and plate fixation   BACK SURGERY     x 2    @ [facility]        Last one 09/2015   CARDIAC CATHETERIZATION     He thinks.   Not really sure where at, thought it was here @ [facility], but its been a LONG time ?2000   CARPAL TUNNEL RELEASE Bilateral    COLECTOMY     COLONOSCOPY  10/22/2017   Colonic polyp status post polypectomy. Moderate predominantly sigmoid diverticulosis.    COLONOSCOPY  2019   Dr Orlena Sheldon   ESOPHAGOGASTRODUODENOSCOPY  06/08/2016   Distal esophageal stricture status post esophageal dilatation. Presbtesophagus, also suggestive of motility disorder. Mild gastritis.    evacuation thrombosed external  hemorrhoid  2021   KNEE SURGERY Right    LUMBAR LAMINECTOMY  10/08/2015   L3-4, Surgeon Deirdre Evener, MD   NASAL SINUS SURGERY     HAS HAD 2 OR 3.   LAST ONE 2011, IN [place]  (LEFT NASAL PASSAGE IS NARROWER THAN RIGHT   SACROILIAC JOINT INJECTION Bilateral 01/10/2016   UPPER GASTROINTESTINAL ENDOSCOPY      Review of systems negative except as noted in HPI / PMHx or noted below:  Review of Systems  Constitutional: Negative.   HENT: Negative.    Eyes: Negative.   Respiratory: Negative.    Cardiovascular: Negative.   Gastrointestinal: Negative.   Genitourinary: Negative.   Musculoskeletal: Negative.   Skin: Negative.   Neurological: Negative.   Endo/Heme/Allergies: Negative.   Psychiatric/Behavioral: Negative.       Objective:   Vitals:   08/31/22 1103  BP: (!) 140/86  Pulse: 99  Resp: 16  SpO2: 97%          Physical Exam Constitutional:      Appearance: He is not diaphoretic.  HENT:     Head: Normocephalic.     Right Ear: Tympanic membrane, ear canal and external ear normal.     Left Ear: Tympanic membrane, ear canal and external ear normal.     Nose: Nose normal. No mucosal edema (Septal perforation with clear borders) or rhinorrhea.     Mouth/Throat:     Pharynx: Uvula midline. No oropharyngeal exudate.  Eyes:     Conjunctiva/sclera: Conjunctivae normal.  Neck:     Thyroid: No thyromegaly.     Trachea: Trachea normal. No tracheal tenderness or tracheal deviation.  Cardiovascular:     Rate and Rhythm: Normal rate and regular rhythm.     Heart sounds: Normal heart sounds, S1 normal and S2 normal. No murmur heard. Pulmonary:     Effort: No respiratory distress.     Breath sounds: Normal breath sounds. No stridor. No wheezing or rales.  Lymphadenopathy:     Head:     Right side of head: No tonsillar adenopathy.     Left side of head: No tonsillar adenopathy.     Cervical: No cervical adenopathy.  Skin:    Findings: No erythema or rash.      Nails: There is  no clubbing.  Neurological:     Mental Status: He is alert.     Diagnostics:    Spirometry was performed and demonstrated an FEV1 of 3.21 at 88 % of predicted.    Assessment and Plan:   1. CVID (common variable immunodeficiency) ([place])   2. 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 Not well controlled moderate persistent asthma   4. Gastroesophageal reflux disease, unspecified whether esophagitis present   5. Hypogammaglobulinemia ([place])    1.  Continue immunoglobulin infusions   2.  Continue to treat and prevent inflammation:   A. Symbicort 160- 2 inhalations 2 times per day w/spacer (empty lungs)  B. Restart Budesonide nasal washes 1-2 times per day  3.  If needed:   A.  Nasal saline  B.  AirSupra -2 inhalations every 4-6 hours (Coupon)  4.  Continue to treat reflux with the following:   A.  Omeprazole 40 mg - 1 tablet 2 times per day   B.  Famotidine 40 mg - 1 tablet 2 times per day  C.  Metoclopramide 5 mg -1 tablet at bedtime  D.  Magnesium based antacids  5. Blood - CBC w/d, IgA/G/M  7. Return to clinic in 6 months or earlier if problem  8.  Obtain flu + RSV vaccine  [name] has had 3 respiratory tract infections over the course of the past 6 months.  I suspect that most of these are probably viral.  But he is not using his nasal budesonide washes and he needs to use this on a regular basis to help with moving mucus across his airway.  He has IgA deficiency and he is not secreting any IgA antibodies into his airway and this is one of the reasons why it is difficult for him to clear infections as well as the fact that he cannot really respond to antigens because of his B-cell memory issue.  He can remain on Symbicort and if needed he can use a combination albuterol/budesonide inhaler.  We will check his blood today as noted above.  Allena Katz, MD Allergy / Immunology [place]

## 2022-08-31 NOTE — Patient Instructions (Addendum)
  1.  Continue immunoglobulin infusions   2.  Continue to treat and prevent inflammation:   A. Symbicort 160- 2 inhalations 2 times per day w/spacer (empty lungs)  B. Restart Budesonide nasal washes 1-2 times per day  3.  If needed:   A.  Nasal saline  B.  AirSupra -2 inhalations every 4-6 hours (Coupon)  4.  Continue to treat reflux with the following:   A.  Omeprazole 40 mg - 1 tablet 2 times per day   B.  Famotidine 40 mg - 1 tablet 2 times per day  C.  Metoclopramide 5 mg -1 tablet at bedtime  D.  Magnesium based antacids  5. Blood - CBC w/d, IgA/G/M  7. Return to clinic in 6 months or earlier if problem  8. Obtain flu + RSV vaccine

## 2022-09-01 DIAGNOSIS — D839 Common variable immunodeficiency, unspecified: Secondary | ICD-10-CM | POA: Diagnosis not present

## 2022-09-01 DIAGNOSIS — J4540 Moderate persistent asthma, uncomplicated: Secondary | ICD-10-CM | POA: Diagnosis not present

## 2022-09-01 DIAGNOSIS — D802 Selective deficiency of immunoglobulin A [IgA]: Secondary | ICD-10-CM | POA: Diagnosis not present

## 2022-09-02 LAB — CBC WITH DIFFERENTIAL/PLATELET
Basophils Absolute: 0.1 10*3/uL (ref 0.0–0.2)
Basos: 1 %
EOS (ABSOLUTE): 0.1 10*3/uL (ref 0.0–0.4)
Eos: 2 %
Hematocrit: 48.7 % (ref 37.5–51.0)
Hemoglobin: 15.9 g/dL (ref 13.0–17.7)
Immature Grans (Abs): 0 10*3/uL (ref 0.0–0.1)
Immature Granulocytes: 0 %
Lymphocytes Absolute: 1.8 10*3/uL (ref 0.7–3.1)
Lymphs: 27 %
MCH: 30.5 pg (ref 26.6–33.0)
MCHC: 32.6 g/dL (ref 31.5–35.7)
MCV: 93 fL (ref 79–97)
Monocytes Absolute: 0.5 10*3/uL (ref 0.1–0.9)
Monocytes: 7 %
Neutrophils Absolute: 4.3 10*3/uL (ref 1.4–7.0)
Neutrophils: 63 %
Platelets: 240 10*3/uL (ref 150–450)
RBC: 5.22 x10E6/uL (ref 4.14–5.80)
RDW: 11.9 % (ref 11.6–15.4)
WBC: 6.7 10*3/uL (ref 3.4–10.8)

## 2022-09-02 LAB — IGG, IGA, IGM
IgA/Immunoglobulin A, Serum: <5 mg/dL — ABNORMAL LOW (ref 61–437)
IgG (Immunoglobin G), Serum: 1392 mg/dL (ref 603–1613)
IgM (Immunoglobulin M), Srm: 50 mg/dL (ref 20–172)

## 2022-09-06 ENCOUNTER — Telehealth: Payer: Self-pay

## 2022-09-06 ENCOUNTER — Other Ambulatory Visit (HOSPITAL_COMMUNITY): Payer: Self-pay

## 2022-09-06 NOTE — Telephone Encounter (Signed)
PA request for Airsupa received from [facility] through [facility].  PA submitted and APPROVED from 09/06/2022-11/13/2023  Key: [id number]

## 2022-09-07 NOTE — Telephone Encounter (Signed)
Approval letter received from [facility] for [place].  Letter attached in patients documents.

## 2022-09-08 DIAGNOSIS — R400 Somnolence: Secondary | ICD-10-CM | POA: Diagnosis not present

## 2022-09-08 DIAGNOSIS — J4540 Moderate persistent asthma, uncomplicated: Secondary | ICD-10-CM | POA: Diagnosis not present

## 2022-09-08 DIAGNOSIS — R5383 Other fatigue: Secondary | ICD-10-CM | POA: Diagnosis not present

## 2022-09-08 DIAGNOSIS — G4733 Obstructive sleep apnea (adult) (pediatric): Secondary | ICD-10-CM | POA: Diagnosis not present

## 2022-09-18 ENCOUNTER — Other Ambulatory Visit: Payer: Self-pay

## 2022-09-18 MED ORDER — IPRATROPIUM BROMIDE 0.06 % NA SOLN: 2.0000 | Freq: Four times a day (QID) | NASAL | 5 refills | Status: DC | PRN

## 2022-09-19 DIAGNOSIS — Z23 Encounter for immunization: Secondary | ICD-10-CM | POA: Diagnosis not present

## 2022-09-21 DIAGNOSIS — G4733 Obstructive sleep apnea (adult) (pediatric): Secondary | ICD-10-CM | POA: Diagnosis not present

## 2022-09-21 NOTE — Addendum Note (Signed)
Addended by: Guy Franco on: 09/21/2022 09:06 AM   Modules accepted: Orders

## 2022-09-29 DIAGNOSIS — N401 Enlarged prostate with lower urinary tract symptoms: Secondary | ICD-10-CM | POA: Diagnosis not present

## 2022-09-29 DIAGNOSIS — E1169 Type 2 diabetes mellitus with other specified complication: Secondary | ICD-10-CM | POA: Diagnosis not present

## 2022-09-29 DIAGNOSIS — K219 Gastro-esophageal reflux disease without esophagitis: Secondary | ICD-10-CM | POA: Diagnosis not present

## 2022-09-29 DIAGNOSIS — F419 Anxiety disorder, unspecified: Secondary | ICD-10-CM | POA: Diagnosis not present

## 2022-09-29 DIAGNOSIS — Z125 Encounter for screening for malignant neoplasm of prostate: Secondary | ICD-10-CM | POA: Diagnosis not present

## 2022-09-29 DIAGNOSIS — E785 Hyperlipidemia, unspecified: Secondary | ICD-10-CM | POA: Diagnosis not present

## 2022-09-29 DIAGNOSIS — F5104 Psychophysiologic insomnia: Secondary | ICD-10-CM | POA: Diagnosis not present

## 2022-09-29 DIAGNOSIS — G4733 Obstructive sleep apnea (adult) (pediatric): Secondary | ICD-10-CM | POA: Diagnosis not present

## 2022-10-02 ENCOUNTER — Telehealth: Payer: Self-pay | Admitting: Allergy and Immunology

## 2022-10-02 NOTE — Telephone Encounter (Signed)
Patient states he's been dealing with headaches and ear popping for the past week. He's not sure if it could be from the rinse he does or his CPAP.

## 2022-10-02 NOTE — Telephone Encounter (Signed)
Informed of Dr. Bruna Potter message.

## 2022-10-02 NOTE — Telephone Encounter (Signed)
Please advise 

## 2022-10-04 DIAGNOSIS — I6523 Occlusion and stenosis of bilateral carotid arteries: Secondary | ICD-10-CM | POA: Diagnosis not present

## 2022-10-04 DIAGNOSIS — J324 Chronic pansinusitis: Secondary | ICD-10-CM | POA: Diagnosis not present

## 2022-10-04 DIAGNOSIS — K219 Gastro-esophageal reflux disease without esophagitis: Secondary | ICD-10-CM | POA: Diagnosis not present

## 2022-10-04 DIAGNOSIS — J329 Chronic sinusitis, unspecified: Secondary | ICD-10-CM | POA: Diagnosis not present

## 2022-10-04 DIAGNOSIS — K429 Umbilical hernia without obstruction or gangrene: Secondary | ICD-10-CM | POA: Diagnosis not present

## 2022-10-04 DIAGNOSIS — Z9889 Other specified postprocedural states: Secondary | ICD-10-CM | POA: Diagnosis not present

## 2022-10-04 DIAGNOSIS — K222 Esophageal obstruction: Secondary | ICD-10-CM | POA: Diagnosis not present

## 2022-10-04 DIAGNOSIS — K5790 Diverticulosis of intestine, part unspecified, without perforation or abscess without bleeding: Secondary | ICD-10-CM | POA: Diagnosis not present

## 2022-10-04 DIAGNOSIS — K5900 Constipation, unspecified: Secondary | ICD-10-CM | POA: Diagnosis not present

## 2022-10-04 DIAGNOSIS — K649 Unspecified hemorrhoids: Secondary | ICD-10-CM | POA: Diagnosis not present

## 2022-10-09 NOTE — Telephone Encounter (Signed)
Patient states he is having sinus pressure, sore throat and is coughing a lot of phlegm. He states he received his infusion on Thursday and started feeling sick on Friday. The pharmacist mentioned if he continued to get sick ,the prescribing provider would need to change the dosage or "split it up".

## 2022-10-10 ENCOUNTER — Encounter: Payer: Self-pay | Admitting: Allergy and Immunology

## 2022-10-10 MED ORDER — CLINDAMYCIN HCL 150 MG PO CAPS: 150.0000 mg | ORAL_CAPSULE | Freq: Three times a day (TID) | ORAL | 0 refills | Status: AC

## 2022-10-10 NOTE — Telephone Encounter (Signed)
Patient informed and verbalized understanding. Antibiotic sent to [facility].   Patient would like to know your thoughts about the infusions. Pharmacist had talked to him about if he continued to get sick he might need a dosage change but to talk to the prescribing provider.   Please advice. Thank you

## 2022-10-10 NOTE — Telephone Encounter (Signed)
Patient informed and verbalized understanding

## 2022-10-10 NOTE — Telephone Encounter (Signed)
Per Dr. Neldon Mc:  Please inform Brandon Gallagher that we have received his message of 09 October 2022 stating that he still having upper respiratory tract symptoms with cough.  I did look at his CT scan of 04 October 2022 which identified thickening of his sinus cavities.  Lets get him to use a broad-spectrum antibiotic using clindamycin 150 mg - 1 tablet 3 times per day for 14 days.

## 2022-10-10 NOTE — Addendum Note (Signed)
Addended by: Guy Franco on: 10/10/2022 12:14 PM   Modules accepted: Orders

## 2022-10-13 DIAGNOSIS — G4733 Obstructive sleep apnea (adult) (pediatric): Secondary | ICD-10-CM | POA: Diagnosis not present

## 2022-10-13 DIAGNOSIS — Z79899 Other long term (current) drug therapy: Secondary | ICD-10-CM | POA: Diagnosis not present

## 2022-10-13 DIAGNOSIS — R5383 Other fatigue: Secondary | ICD-10-CM | POA: Diagnosis not present

## 2022-10-13 DIAGNOSIS — R400 Somnolence: Secondary | ICD-10-CM | POA: Diagnosis not present

## 2022-10-13 DIAGNOSIS — J4540 Moderate persistent asthma, uncomplicated: Secondary | ICD-10-CM | POA: Diagnosis not present

## 2022-10-20 DIAGNOSIS — J4540 Moderate persistent asthma, uncomplicated: Secondary | ICD-10-CM | POA: Diagnosis not present

## 2022-10-20 DIAGNOSIS — Z79899 Other long term (current) drug therapy: Secondary | ICD-10-CM | POA: Diagnosis not present

## 2022-10-20 DIAGNOSIS — R400 Somnolence: Secondary | ICD-10-CM | POA: Diagnosis not present

## 2022-10-20 DIAGNOSIS — G4733 Obstructive sleep apnea (adult) (pediatric): Secondary | ICD-10-CM | POA: Diagnosis not present

## 2022-10-20 DIAGNOSIS — R5383 Other fatigue: Secondary | ICD-10-CM | POA: Diagnosis not present

## 2022-11-01 DIAGNOSIS — J4540 Moderate persistent asthma, uncomplicated: Secondary | ICD-10-CM | POA: Diagnosis not present

## 2022-11-01 DIAGNOSIS — G4733 Obstructive sleep apnea (adult) (pediatric): Secondary | ICD-10-CM | POA: Diagnosis not present

## 2022-11-01 DIAGNOSIS — R400 Somnolence: Secondary | ICD-10-CM | POA: Diagnosis not present

## 2022-11-01 DIAGNOSIS — R5383 Other fatigue: Secondary | ICD-10-CM | POA: Diagnosis not present

## 2022-11-03 DIAGNOSIS — M545 Low back pain, unspecified: Secondary | ICD-10-CM | POA: Diagnosis not present

## 2022-11-03 DIAGNOSIS — M6530 Trigger finger, unspecified finger: Secondary | ICD-10-CM | POA: Diagnosis not present

## 2022-11-12 DIAGNOSIS — E785 Hyperlipidemia, unspecified: Secondary | ICD-10-CM | POA: Diagnosis not present

## 2022-11-12 DIAGNOSIS — E1169 Type 2 diabetes mellitus with other specified complication: Secondary | ICD-10-CM | POA: Diagnosis not present

## 2022-11-13 HISTORY — PX: TRIGGER FINGER RELEASE: SHX641

## 2022-11-13 HISTORY — PX: BACK SURGERY: SHX140

## 2022-11-15 DIAGNOSIS — M79641 Pain in right hand: Secondary | ICD-10-CM | POA: Diagnosis not present

## 2022-11-15 DIAGNOSIS — M65332 Trigger finger, left middle finger: Secondary | ICD-10-CM | POA: Diagnosis not present

## 2022-11-15 DIAGNOSIS — M79642 Pain in left hand: Secondary | ICD-10-CM | POA: Diagnosis not present

## 2022-11-16 DIAGNOSIS — K429 Umbilical hernia without obstruction or gangrene: Secondary | ICD-10-CM | POA: Diagnosis not present

## 2022-11-25 DIAGNOSIS — L820 Inflamed seborrheic keratosis: Secondary | ICD-10-CM | POA: Diagnosis not present

## 2022-11-25 DIAGNOSIS — D485 Neoplasm of uncertain behavior of skin: Secondary | ICD-10-CM | POA: Diagnosis not present

## 2022-11-25 DIAGNOSIS — D225 Melanocytic nevi of trunk: Secondary | ICD-10-CM | POA: Diagnosis not present

## 2022-11-25 DIAGNOSIS — L570 Actinic keratosis: Secondary | ICD-10-CM | POA: Diagnosis not present

## 2022-11-25 DIAGNOSIS — D2239 Melanocytic nevi of other parts of face: Secondary | ICD-10-CM | POA: Diagnosis not present

## 2022-11-28 DIAGNOSIS — M19071 Primary osteoarthritis, right ankle and foot: Secondary | ICD-10-CM | POA: Diagnosis not present

## 2022-11-28 DIAGNOSIS — M25571 Pain in right ankle and joints of right foot: Secondary | ICD-10-CM | POA: Diagnosis not present

## 2022-12-01 DIAGNOSIS — R400 Somnolence: Secondary | ICD-10-CM | POA: Diagnosis not present

## 2022-12-01 DIAGNOSIS — J4540 Moderate persistent asthma, uncomplicated: Secondary | ICD-10-CM | POA: Diagnosis not present

## 2022-12-01 DIAGNOSIS — R5383 Other fatigue: Secondary | ICD-10-CM | POA: Diagnosis not present

## 2022-12-01 DIAGNOSIS — G4733 Obstructive sleep apnea (adult) (pediatric): Secondary | ICD-10-CM | POA: Diagnosis not present

## 2022-12-05 DIAGNOSIS — M62830 Muscle spasm of back: Secondary | ICD-10-CM | POA: Diagnosis not present

## 2022-12-08 DIAGNOSIS — K429 Umbilical hernia without obstruction or gangrene: Secondary | ICD-10-CM | POA: Diagnosis not present

## 2022-12-08 DIAGNOSIS — E119 Type 2 diabetes mellitus without complications: Secondary | ICD-10-CM | POA: Diagnosis not present

## 2022-12-08 DIAGNOSIS — Z79899 Other long term (current) drug therapy: Secondary | ICD-10-CM | POA: Diagnosis not present

## 2022-12-08 DIAGNOSIS — F3289 Other specified depressive episodes: Secondary | ICD-10-CM | POA: Diagnosis not present

## 2022-12-08 DIAGNOSIS — R45851 Suicidal ideations: Secondary | ICD-10-CM | POA: Diagnosis not present

## 2022-12-08 DIAGNOSIS — F339 Major depressive disorder, recurrent, unspecified: Secondary | ICD-10-CM | POA: Diagnosis not present

## 2022-12-08 DIAGNOSIS — Z5309 Procedure and treatment not carried out because of other contraindication: Secondary | ICD-10-CM | POA: Diagnosis not present

## 2022-12-19 DIAGNOSIS — M65332 Trigger finger, left middle finger: Secondary | ICD-10-CM | POA: Diagnosis not present

## 2022-12-19 DIAGNOSIS — M65342 Trigger finger, left ring finger: Secondary | ICD-10-CM | POA: Diagnosis not present

## 2022-12-26 DIAGNOSIS — G4733 Obstructive sleep apnea (adult) (pediatric): Secondary | ICD-10-CM | POA: Diagnosis not present

## 2022-12-29 DIAGNOSIS — G4733 Obstructive sleep apnea (adult) (pediatric): Secondary | ICD-10-CM | POA: Diagnosis not present

## 2022-12-29 DIAGNOSIS — R5383 Other fatigue: Secondary | ICD-10-CM | POA: Diagnosis not present

## 2022-12-29 DIAGNOSIS — J4540 Moderate persistent asthma, uncomplicated: Secondary | ICD-10-CM | POA: Diagnosis not present

## 2022-12-29 DIAGNOSIS — R400 Somnolence: Secondary | ICD-10-CM | POA: Diagnosis not present

## 2022-12-29 DIAGNOSIS — Z79899 Other long term (current) drug therapy: Secondary | ICD-10-CM | POA: Diagnosis not present

## 2023-01-02 DIAGNOSIS — M65341 Trigger finger, right ring finger: Secondary | ICD-10-CM | POA: Diagnosis not present

## 2023-01-02 DIAGNOSIS — M65331 Trigger finger, right middle finger: Secondary | ICD-10-CM | POA: Diagnosis not present

## 2023-01-02 DIAGNOSIS — M65342 Trigger finger, left ring finger: Secondary | ICD-10-CM | POA: Diagnosis not present

## 2023-01-02 DIAGNOSIS — M65332 Trigger finger, left middle finger: Secondary | ICD-10-CM | POA: Diagnosis not present

## 2023-01-06 DIAGNOSIS — E785 Hyperlipidemia, unspecified: Secondary | ICD-10-CM | POA: Diagnosis not present

## 2023-01-06 DIAGNOSIS — F419 Anxiety disorder, unspecified: Secondary | ICD-10-CM | POA: Diagnosis not present

## 2023-01-06 DIAGNOSIS — F5104 Psychophysiologic insomnia: Secondary | ICD-10-CM | POA: Diagnosis not present

## 2023-01-06 DIAGNOSIS — E1169 Type 2 diabetes mellitus with other specified complication: Secondary | ICD-10-CM | POA: Diagnosis not present

## 2023-01-10 DIAGNOSIS — H524 Presbyopia: Secondary | ICD-10-CM | POA: Diagnosis not present

## 2023-01-13 DIAGNOSIS — R051 Acute cough: Secondary | ICD-10-CM | POA: Diagnosis not present

## 2023-01-13 DIAGNOSIS — J101 Influenza due to other identified influenza virus with other respiratory manifestations: Secondary | ICD-10-CM | POA: Diagnosis not present

## 2023-01-24 DIAGNOSIS — M65342 Trigger finger, left ring finger: Secondary | ICD-10-CM | POA: Diagnosis not present

## 2023-01-24 DIAGNOSIS — M65332 Trigger finger, left middle finger: Secondary | ICD-10-CM | POA: Diagnosis not present

## 2023-02-06 DIAGNOSIS — M65342 Trigger finger, left ring finger: Secondary | ICD-10-CM | POA: Diagnosis not present

## 2023-02-06 DIAGNOSIS — M65332 Trigger finger, left middle finger: Secondary | ICD-10-CM | POA: Diagnosis not present

## 2023-02-11 DIAGNOSIS — E1169 Type 2 diabetes mellitus with other specified complication: Secondary | ICD-10-CM | POA: Diagnosis not present

## 2023-02-11 DIAGNOSIS — F419 Anxiety disorder, unspecified: Secondary | ICD-10-CM | POA: Diagnosis not present

## 2023-02-21 DIAGNOSIS — E785 Hyperlipidemia, unspecified: Secondary | ICD-10-CM | POA: Diagnosis not present

## 2023-02-21 DIAGNOSIS — K429 Umbilical hernia without obstruction or gangrene: Secondary | ICD-10-CM | POA: Diagnosis not present

## 2023-02-21 DIAGNOSIS — E1169 Type 2 diabetes mellitus with other specified complication: Secondary | ICD-10-CM | POA: Diagnosis not present

## 2023-02-26 DIAGNOSIS — R5383 Other fatigue: Secondary | ICD-10-CM | POA: Diagnosis not present

## 2023-02-26 DIAGNOSIS — R400 Somnolence: Secondary | ICD-10-CM | POA: Diagnosis not present

## 2023-02-26 DIAGNOSIS — G4733 Obstructive sleep apnea (adult) (pediatric): Secondary | ICD-10-CM | POA: Diagnosis not present

## 2023-02-26 DIAGNOSIS — J4540 Moderate persistent asthma, uncomplicated: Secondary | ICD-10-CM | POA: Diagnosis not present

## 2023-03-01 ENCOUNTER — Ambulatory Visit: Payer: Medicare HMO | Admitting: Allergy and Immunology

## 2023-03-02 DIAGNOSIS — E785 Hyperlipidemia, unspecified: Secondary | ICD-10-CM | POA: Diagnosis not present

## 2023-03-02 DIAGNOSIS — Z8719 Personal history of other diseases of the digestive system: Secondary | ICD-10-CM | POA: Diagnosis not present

## 2023-03-02 DIAGNOSIS — G8918 Other acute postprocedural pain: Secondary | ICD-10-CM | POA: Diagnosis not present

## 2023-03-02 DIAGNOSIS — G4733 Obstructive sleep apnea (adult) (pediatric): Secondary | ICD-10-CM | POA: Diagnosis not present

## 2023-03-02 DIAGNOSIS — Z7984 Long term (current) use of oral hypoglycemic drugs: Secondary | ICD-10-CM | POA: Diagnosis not present

## 2023-03-02 DIAGNOSIS — J45909 Unspecified asthma, uncomplicated: Secondary | ICD-10-CM | POA: Diagnosis not present

## 2023-03-02 DIAGNOSIS — K219 Gastro-esophageal reflux disease without esophagitis: Secondary | ICD-10-CM | POA: Diagnosis not present

## 2023-03-02 DIAGNOSIS — Z79899 Other long term (current) drug therapy: Secondary | ICD-10-CM | POA: Diagnosis not present

## 2023-03-02 DIAGNOSIS — E119 Type 2 diabetes mellitus without complications: Secondary | ICD-10-CM | POA: Diagnosis not present

## 2023-03-02 DIAGNOSIS — K429 Umbilical hernia without obstruction or gangrene: Secondary | ICD-10-CM | POA: Diagnosis not present

## 2023-03-03 DIAGNOSIS — Z79899 Other long term (current) drug therapy: Secondary | ICD-10-CM | POA: Diagnosis not present

## 2023-03-03 DIAGNOSIS — G4733 Obstructive sleep apnea (adult) (pediatric): Secondary | ICD-10-CM | POA: Diagnosis not present

## 2023-03-03 DIAGNOSIS — Z8719 Personal history of other diseases of the digestive system: Secondary | ICD-10-CM | POA: Diagnosis not present

## 2023-03-03 DIAGNOSIS — Z7984 Long term (current) use of oral hypoglycemic drugs: Secondary | ICD-10-CM | POA: Diagnosis not present

## 2023-03-03 DIAGNOSIS — K219 Gastro-esophageal reflux disease without esophagitis: Secondary | ICD-10-CM | POA: Diagnosis not present

## 2023-03-03 DIAGNOSIS — E785 Hyperlipidemia, unspecified: Secondary | ICD-10-CM | POA: Diagnosis not present

## 2023-03-03 DIAGNOSIS — K429 Umbilical hernia without obstruction or gangrene: Secondary | ICD-10-CM | POA: Diagnosis not present

## 2023-03-03 DIAGNOSIS — J45909 Unspecified asthma, uncomplicated: Secondary | ICD-10-CM | POA: Diagnosis not present

## 2023-03-14 DIAGNOSIS — E785 Hyperlipidemia, unspecified: Secondary | ICD-10-CM | POA: Diagnosis not present

## 2023-03-14 DIAGNOSIS — E1169 Type 2 diabetes mellitus with other specified complication: Secondary | ICD-10-CM | POA: Diagnosis not present

## 2023-03-19 DIAGNOSIS — L814 Other melanin hyperpigmentation: Secondary | ICD-10-CM | POA: Diagnosis not present

## 2023-03-19 DIAGNOSIS — L578 Other skin changes due to chronic exposure to nonionizing radiation: Secondary | ICD-10-CM | POA: Diagnosis not present

## 2023-03-19 DIAGNOSIS — L570 Actinic keratosis: Secondary | ICD-10-CM | POA: Diagnosis not present

## 2023-03-21 DIAGNOSIS — Z09 Encounter for follow-up examination after completed treatment for conditions other than malignant neoplasm: Secondary | ICD-10-CM | POA: Diagnosis not present

## 2023-03-21 DIAGNOSIS — K429 Umbilical hernia without obstruction or gangrene: Secondary | ICD-10-CM | POA: Diagnosis not present

## 2023-03-24 DIAGNOSIS — E785 Hyperlipidemia, unspecified: Secondary | ICD-10-CM | POA: Diagnosis not present

## 2023-03-24 DIAGNOSIS — G4733 Obstructive sleep apnea (adult) (pediatric): Secondary | ICD-10-CM | POA: Diagnosis not present

## 2023-03-24 DIAGNOSIS — F5104 Psychophysiologic insomnia: Secondary | ICD-10-CM | POA: Diagnosis not present

## 2023-03-24 DIAGNOSIS — N401 Enlarged prostate with lower urinary tract symptoms: Secondary | ICD-10-CM | POA: Diagnosis not present

## 2023-03-24 DIAGNOSIS — G43909 Migraine, unspecified, not intractable, without status migrainosus: Secondary | ICD-10-CM | POA: Diagnosis not present

## 2023-03-24 DIAGNOSIS — B001 Herpesviral vesicular dermatitis: Secondary | ICD-10-CM | POA: Diagnosis not present

## 2023-03-24 DIAGNOSIS — E1169 Type 2 diabetes mellitus with other specified complication: Secondary | ICD-10-CM | POA: Diagnosis not present

## 2023-03-24 DIAGNOSIS — F419 Anxiety disorder, unspecified: Secondary | ICD-10-CM | POA: Diagnosis not present

## 2023-03-24 DIAGNOSIS — K219 Gastro-esophageal reflux disease without esophagitis: Secondary | ICD-10-CM | POA: Diagnosis not present

## 2023-04-03 DIAGNOSIS — R400 Somnolence: Secondary | ICD-10-CM | POA: Diagnosis not present

## 2023-04-03 DIAGNOSIS — R5383 Other fatigue: Secondary | ICD-10-CM | POA: Diagnosis not present

## 2023-04-03 DIAGNOSIS — G4733 Obstructive sleep apnea (adult) (pediatric): Secondary | ICD-10-CM | POA: Diagnosis not present

## 2023-04-03 DIAGNOSIS — J4540 Moderate persistent asthma, uncomplicated: Secondary | ICD-10-CM | POA: Diagnosis not present

## 2023-04-03 DIAGNOSIS — Z79899 Other long term (current) drug therapy: Secondary | ICD-10-CM | POA: Diagnosis not present

## 2023-04-13 DIAGNOSIS — E1169 Type 2 diabetes mellitus with other specified complication: Secondary | ICD-10-CM | POA: Diagnosis not present

## 2023-04-13 DIAGNOSIS — F419 Anxiety disorder, unspecified: Secondary | ICD-10-CM | POA: Diagnosis not present

## 2023-05-08 DIAGNOSIS — R059 Cough, unspecified: Secondary | ICD-10-CM | POA: Diagnosis not present

## 2023-05-08 DIAGNOSIS — J4540 Moderate persistent asthma, uncomplicated: Secondary | ICD-10-CM | POA: Diagnosis not present

## 2023-05-08 DIAGNOSIS — G4733 Obstructive sleep apnea (adult) (pediatric): Secondary | ICD-10-CM | POA: Diagnosis not present

## 2023-05-08 DIAGNOSIS — R400 Somnolence: Secondary | ICD-10-CM | POA: Diagnosis not present

## 2023-05-08 DIAGNOSIS — R5383 Other fatigue: Secondary | ICD-10-CM | POA: Diagnosis not present

## 2023-05-30 DIAGNOSIS — R400 Somnolence: Secondary | ICD-10-CM | POA: Diagnosis not present

## 2023-05-30 DIAGNOSIS — J4540 Moderate persistent asthma, uncomplicated: Secondary | ICD-10-CM | POA: Diagnosis not present

## 2023-05-30 DIAGNOSIS — G4733 Obstructive sleep apnea (adult) (pediatric): Secondary | ICD-10-CM | POA: Diagnosis not present

## 2023-05-30 DIAGNOSIS — R5383 Other fatigue: Secondary | ICD-10-CM | POA: Diagnosis not present

## 2023-05-30 DIAGNOSIS — R059 Cough, unspecified: Secondary | ICD-10-CM | POA: Diagnosis not present

## 2023-06-18 DIAGNOSIS — H8113 Benign paroxysmal vertigo, bilateral: Secondary | ICD-10-CM | POA: Diagnosis not present

## 2023-06-18 DIAGNOSIS — B9689 Other specified bacterial agents as the cause of diseases classified elsewhere: Secondary | ICD-10-CM | POA: Diagnosis not present

## 2023-06-18 DIAGNOSIS — J019 Acute sinusitis, unspecified: Secondary | ICD-10-CM | POA: Diagnosis not present

## 2023-06-21 ENCOUNTER — Ambulatory Visit: Payer: Medicare HMO | Admitting: Allergy and Immunology

## 2023-07-03 DIAGNOSIS — B370 Candidal stomatitis: Secondary | ICD-10-CM | POA: Diagnosis not present

## 2023-07-04 DIAGNOSIS — Z79899 Other long term (current) drug therapy: Secondary | ICD-10-CM | POA: Diagnosis not present

## 2023-07-04 DIAGNOSIS — R400 Somnolence: Secondary | ICD-10-CM | POA: Diagnosis not present

## 2023-07-04 DIAGNOSIS — J4540 Moderate persistent asthma, uncomplicated: Secondary | ICD-10-CM | POA: Diagnosis not present

## 2023-07-04 DIAGNOSIS — R5383 Other fatigue: Secondary | ICD-10-CM | POA: Diagnosis not present

## 2023-07-04 DIAGNOSIS — G4733 Obstructive sleep apnea (adult) (pediatric): Secondary | ICD-10-CM | POA: Diagnosis not present

## 2023-07-04 DIAGNOSIS — R059 Cough, unspecified: Secondary | ICD-10-CM | POA: Diagnosis not present

## 2023-07-05 ENCOUNTER — Encounter: Payer: Self-pay | Admitting: Allergy and Immunology

## 2023-07-05 ENCOUNTER — Ambulatory Visit (INDEPENDENT_AMBULATORY_CARE_PROVIDER_SITE_OTHER): Payer: Medicare HMO | Admitting: Allergy and Immunology

## 2023-07-05 VITALS — BP 122/70 | HR 74 | Resp 18 | Ht 70.0 in | Wt 244.6 lb

## 2023-07-05 DIAGNOSIS — J4540 Moderate persistent asthma, uncomplicated: Secondary | ICD-10-CM | POA: Diagnosis not present

## 2023-07-05 DIAGNOSIS — H8103 Meniere's disease, bilateral: Secondary | ICD-10-CM | POA: Diagnosis not present

## 2023-07-05 DIAGNOSIS — D839 Common variable immunodeficiency, unspecified: Secondary | ICD-10-CM

## 2023-07-05 DIAGNOSIS — J3489 Other specified disorders of nose and nasal sinuses: Secondary | ICD-10-CM | POA: Diagnosis not present

## 2023-07-05 NOTE — Patient Instructions (Addendum)
  1.  Continue immunoglobulin infusions   2.  Continue to treat and prevent inflammation:   A. Budesonide nasal washes 1-2 times per day  3.  If needed:   A.  Nasal saline  B.  AirSupra -2 inhalations every 4-6 hours   4.  Continue to treat reflux with the following:   A.  Omeprazole 40 mg - 1 tablet 2 times per day   B.  Famotidine 40 mg - 1 tablet 2 times per day  C.  Magnesium based antacids  5. Blood - CBC w/d, IgA/G/M  6. Visit with [facility] for Mnire's disease  7. Return to clinic in 6 months or earlier if problem  8. Obtain fall flu vaccine

## 2023-07-05 NOTE — Progress Notes (Signed)
[place] [facility] [place] [facility] [place] [facility] [place] [facility] [place]   Follow-up Note  Referring Provider: Paulina Fusi, MD Primary Provider: Paulina Fusi, MD Date of Office Visit: 07/05/2023  Subjective:   Brandon Gallagher (DOB: 1961-01-28) is a 62 y.o. 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  I last saw him in this clinic 31 August 2022.  He has done pretty well regarding infections.  He has only had 1 respiratory tract infection for which he recently was treated with Levaquin and some prednisone that involved both his head and chest but he has resolved this issue.  He did have a secondary bout of thrush for which he took Diflucan.  But otherwise he has gone through the past year without any infections and without the need for any systemic steroids or antibiotics and overall feels as though he has been doing relatively well.  He has had very little problems with his asthma and he rarely uses any short acting bronchodilator and he is no longer using any Symbicort.  He will occasionally use his Paulene Floor if needed.  He has done very well with his upper airways and he uses some budesonide nasal washes.  Reflux is okay while using omeprazole and famotidine.  He no longer uses any metoclopramide.  He will take some antacids on occasion.  He has been having problems with dizziness.  He tells me that he has been diagnosed with Mnire's disease many years ago and he does have tinnitus and he does believe that his hearing is getting worse over the course of the past several years.  He has not had follow-up with ENT many years regarding his Mnire's disease.  Allergies as of 07/05/2023   No Known Allergies      Medication List    Accu-Chek Guide Me w/Device Kit 1 (one) Kit  daily   Accu-Chek Guide test strip Generic drug: glucose blood 1 (one) Each in vitro daily   acetaminophen 500 MG tablet Commonly known as: TYLENOL Take 500-1,000 mg by mouth every 6 (six) hours as needed for moderate pain.   Airsupra 90-80 MCG/ACT Aero Generic drug: Albuterol-Budesonide Inhale 2 puffs into the lungs every 4 (four) hours.   ALPRAZolam 1 MG tablet Commonly known as: XANAX Take 1 mg by mouth 2 (two) times daily.   budesonide 1 MG/2ML nebulizer solution Commonly known as: PULMICORT Mix one ampule into 200-250 mL of distilled water/saline and irrigate nasal passages twice daily as directed.   buPROPion 300 MG 24 hr tablet Commonly known as: WELLBUTRIN XL Take 300 mg by mouth daily.   divalproex 500 MG DR tablet Commonly known as: DEPAKOTE Take 500 mg by mouth 2 (two) times daily.   EPINEPHrine 0.3 mg/0.3 mL Soaj injection Commonly known as: EPI-PEN SMARTSIG:0.3 Milliliter(s) IM PRN   famotidine 40 MG tablet Commonly known as: PEPCID Take one tablet by mouth one to two times daily as directed.   Farxiga 10 MG Tabs tablet Generic drug: dapagliflozin propanediol Take 10 mg by mouth daily.   fluconazole 150 MG tablet Commonly known as: DIFLUCAN Take 150 mg by mouth daily.   gabapentin 300 MG capsule Commonly known as: NEURONTIN Take 1 capsule (300 mg total) by mouth at bedtime.   Geri-Dryl 25 MG tablet Generic drug: diphenhydrAMINE SMARTSIG:2 Tablet(s) By  Mouth   Hizentra 10 GM/50ML Soln Generic drug: Immune Globulin (Human) INFUSE 20g SUBCUTANEOUSLY EVERY OTHER WEEK   IBUPROFEN PM PO Take by mouth as needed.   levofloxacin 750 MG tablet Commonly known as: LEVAQUIN Take 750 mg by mouth daily.   meclizine 25 MG tablet Commonly known as: ANTIVERT Take 25 mg by mouth 3 (three) times daily as needed.   metFORMIN 500 MG tablet Commonly known as: GLUCOPHAGE Take 500 mg by mouth 2 (two) times daily.   metoCLOPramide 5 MG tablet Commonly  known as: REGLAN Take 1 tablet by mouth at bedtime   montelukast 10 MG tablet Commonly known as: SINGULAIR Take by mouth.   nystatin 100000 UNIT/ML suspension Commonly known as: MYCOSTATIN Take 10 mLs by mouth 4 (four) times daily.   nystatin powder Commonly known as: MYCOSTATIN/NYSTOP Apply topically 3 (three) times daily.   omeprazole 40 MG capsule Commonly known as: PRILOSEC Take 1 tablet 2 times per day   rosuvastatin 5 MG tablet Commonly known as: CRESTOR SMARTSIG:1 Tablet(s) By Mouth Every Evening   tamsulosin 0.4 MG Caps capsule Commonly known as: FLOMAX Take 0.4 mg by mouth in the morning and at bedtime.   Ubrelvy 100 MG Tabs Generic drug: Ubrogepant Take by mouth.   venlafaxine XR 150 MG 24 hr capsule Commonly known as: EFFEXOR-XR Take 150 mg by mouth daily.   zolpidem 12.5 MG CR tablet Commonly known as: AMBIEN CR SMARTSIG:1 Tablet(s) By Mouth Every Evening    Past Medical History:  Diagnosis Date   Acute respiratory disease due to COVID-19 virus 10/30/2019   Acute respiratory failure with hypoxia (HCC) 10/30/2019   ADHD (attention deficit hyperactivity disorder)    diagnosed as a teenager   Allergy    Anxiety    Arthritis    Asthma    Blood transfusion without reported diagnosis    Bronchitis    last had back in 2017   Sees no pulmonary md   Cervical spondylosis with radiculopathy 01/01/2017   Chronic bilateral low back pain without sciatica 01/10/2016   COPD (chronic obstructive pulmonary disease) (HCC)    Depression    Diabetes mellitus without complication (HCC) 2022   Diverticulitis of sigmoid colon 02/05/2020   Diverticulosis    last flare up was in Jan. 2018  Was on course of antibiotics for this   Esophageal stricture    Esophagus disorder    narrowing of esophagus.  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 Edwyna Shell   Overview:  S/P minimally invasive lumbar laminectomy L3-4 performed on 10/08/2015 by Dr. Edwyna Shell    Sacroiliac joint pain 07/27/2016   Sinus bradycardia by electrocardiogram 10/30/2019   Sleep apnea    no CPAP- unsure if true diagnosis or not   Thrombosed external hemorrhoid  Past Surgical History:  Procedure Laterality Date   ANTERIOR CERVICAL DECOMP/DISCECTOMY FUSION N/A 01/01/2017   Procedure: Cervical four- five and Cervical six- seven Anterior cervical discectomy with fusion and plate fixation;  Surgeon: Loura Halt Ditty, MD;  Location: [facility] OR;  Service: Neurosurgery;  Laterality: N/A;  C4-5 and C6-7 Anterior cervical discectomy with fusion and plate fixation   BACK SURGERY     x 2    @ [facility]        Last one 09/2015   BACK SURGERY  2024   three surgeries   CARDIAC CATHETERIZATION     He thinks.   Not really sure where at, thought it was here @ [facility], but its been a LONG time ?2000   CARPAL TUNNEL RELEASE Bilateral    COLECTOMY     COLONOSCOPY  10/22/2017   Colonic polyp status post polypectomy. Moderate predominantly sigmoid diverticulosis.    COLONOSCOPY  2019   Dr Rayfield Citizen   ESOPHAGOGASTRODUODENOSCOPY  06/08/2016   Distal esophageal stricture status post esophageal dilatation. Presbtesophagus, also suggestive of motility disorder. Mild gastritis.    evacuation thrombosed external hemorrhoid  2021   HERNIA REPAIR  2023   KNEE SURGERY Right    LUMBAR  LAMINECTOMY  10/08/2015   L3-4, Surgeon Lurlean Nanny, MD   NASAL SINUS SURGERY     HAS HAD 2 OR 3.   LAST ONE 2011, IN [place]  (LEFT NASAL PASSAGE IS NARROWER THAN RIGHT   OTHER SURGICAL HISTORY  2023   Surgery for diverticulitis   SACROILIAC JOINT INJECTION Bilateral 01/10/2016   TRIGGER FINGER RELEASE Left 2024   UPPER GASTROINTESTINAL ENDOSCOPY      Review of systems negative except as noted in HPI / PMHx or noted below:  Review of Systems  Constitutional: Negative.   HENT: Negative.    Eyes: Negative.   Respiratory: Negative.    Cardiovascular: Negative.   Gastrointestinal: Negative.   Genitourinary: Negative.   Musculoskeletal: Negative.   Skin: Negative.   Neurological: Negative.   Endo/Heme/Allergies: Negative.   Psychiatric/Behavioral: Negative.       Objective:   Vitals:   07/05/23 0834  BP: 122/70  Pulse: 74  Resp: 18  SpO2: 95%   Height: 5\' 10"  (177.8 cm)  Weight: 244 lb 9.6 oz (110.9 kg)   Physical Exam Constitutional:      Appearance: He is not diaphoretic.  HENT:     Head: Normocephalic.     Right Ear: Tympanic membrane, ear canal and external ear normal.     Left Ear: Tympanic membrane, ear canal and external ear normal.     Nose: Nose normal. No mucosal edema (Septal perforation) or rhinorrhea.     Mouth/Throat:     Pharynx: Uvula midline. No oropharyngeal exudate.  Eyes:     Conjunctiva/sclera: Conjunctivae normal.  Neck:     Thyroid: No thyromegaly.     Trachea: Trachea normal. No tracheal tenderness or tracheal deviation.  Cardiovascular:     Rate and Rhythm: Normal rate and regular rhythm.     Heart sounds: Normal heart sounds, S1 normal and S2 normal. No murmur heard. Pulmonary:     Effort: No respiratory distress.     Breath sounds: Normal breath sounds. No stridor. No wheezing or rales.  Lymphadenopathy:     Head:     Right side of head: No tonsillar adenopathy.     Left side of head: No tonsillar adenopathy.      Cervical: No cervical adenopathy.  Skin:    Findings: No erythema or rash.     Nails: There is no clubbing.  Neurological:     Mental Status: He is alert.     Diagnostics:    Spirometry was performed and demonstrated an FEV1 of 3.24 at 93 % of predicted.  Results of blood tests obtained 01 September 2022 identifies IgG 1392 Mg/DL, IgM 50 Mg/DL, IgA less than 5 Mg/DL, WBC 6.7, absolute eosinophil 100, absolute lymphocyte 1800, hemoglobin 15.9, platelet 240  Assessment and Plan:   1. CVID (common variable immunodeficiency) (HCC)   2. Asthma, moderate persistent, well-controlled   3. Nasal septal perforation   4. Meniere's disease of both ears    1.  Continue immunoglobulin infusions   2.  Continue to treat and prevent inflammation:   A. Budesonide nasal washes 1-2 times per day  3.  If needed:   A.  Nasal saline  B.  AirSupra -2 inhalations every 4-6 hours   4.  Continue to treat reflux with the following:   A.  Omeprazole 40 mg - 1 tablet 2 times per day   B.  Famotidine 40 mg - 1 tablet 2 times per day  C.  Magnesium based antacids  5. Blood - CBC w/d, IgA/G/M  6. Visit with [facility] for Mnire's disease  7. Return to clinic in 6 months or earlier if problem  8. Obtain fall flu vaccine  Brandon Gallagher is doing relatively well on his current plan which includes immunoglobulin infusions, some nasal budesonide washes, as needed use of a combination anti-inflammatory rescue medicine, and therapy directed against reflux.  We will refer him onto [facility] for further evaluation of his Mnire's disease.  Will check his immunoglobulin levels.  I will see him back in his clinic in 6 months or earlier if there is a problem.  Laurette Schimke, MD Allergy / Immunology [facility]

## 2023-07-09 ENCOUNTER — Encounter: Payer: Self-pay | Admitting: Allergy and Immunology

## 2023-07-12 DIAGNOSIS — D839 Common variable immunodeficiency, unspecified: Secondary | ICD-10-CM | POA: Diagnosis not present

## 2023-07-12 DIAGNOSIS — G5793 Unspecified mononeuropathy of bilateral lower limbs: Secondary | ICD-10-CM | POA: Diagnosis not present

## 2023-07-13 LAB — CBC WITH DIFFERENTIAL/PLATELET
Basophils Absolute: 0 10*3/uL (ref 0.0–0.2)
Basos: 0 %
EOS (ABSOLUTE): 0.2 10*3/uL (ref 0.0–0.4)
Eos: 2 %
Hematocrit: 51.3 % — ABNORMAL HIGH (ref 37.5–51.0)
Hemoglobin: 16.7 g/dL (ref 13.0–17.7)
Immature Grans (Abs): 0 10*3/uL (ref 0.0–0.1)
Immature Granulocytes: 0 %
Lymphocytes Absolute: 1.7 10*3/uL (ref 0.7–3.1)
Lymphs: 26 %
MCH: 29 pg (ref 26.6–33.0)
MCHC: 32.6 g/dL (ref 31.5–35.7)
MCV: 89 fL (ref 79–97)
Monocytes Absolute: 0.4 10*3/uL (ref 0.1–0.9)
Monocytes: 6 %
Neutrophils Absolute: 4.4 10*3/uL (ref 1.4–7.0)
Neutrophils: 66 %
Platelets: 172 10*3/uL (ref 150–450)
RBC: 5.76 x10E6/uL (ref 4.14–5.80)
RDW: 13.1 % (ref 11.6–15.4)
WBC: 6.8 10*3/uL (ref 3.4–10.8)

## 2023-07-13 LAB — IGG, IGA, IGM
IgA/Immunoglobulin A, Serum: <5 mg/dL — ABNORMAL LOW (ref 61–437)
IgG (Immunoglobin G), Serum: 1087 mg/dL (ref 603–1613)
IgM (Immunoglobulin M), Srm: 23 mg/dL (ref 20–172)

## 2023-07-20 DIAGNOSIS — E119 Type 2 diabetes mellitus without complications: Secondary | ICD-10-CM | POA: Diagnosis not present

## 2023-07-20 DIAGNOSIS — R269 Unspecified abnormalities of gait and mobility: Secondary | ICD-10-CM | POA: Diagnosis not present

## 2023-07-20 DIAGNOSIS — Z79899 Other long term (current) drug therapy: Secondary | ICD-10-CM | POA: Diagnosis not present

## 2023-07-20 DIAGNOSIS — R4701 Aphasia: Secondary | ICD-10-CM | POA: Diagnosis not present

## 2023-07-20 DIAGNOSIS — R42 Dizziness and giddiness: Secondary | ICD-10-CM | POA: Diagnosis not present

## 2023-07-20 DIAGNOSIS — F32A Depression, unspecified: Secondary | ICD-10-CM | POA: Diagnosis not present

## 2023-07-20 DIAGNOSIS — G4733 Obstructive sleep apnea (adult) (pediatric): Secondary | ICD-10-CM | POA: Diagnosis not present

## 2023-07-20 DIAGNOSIS — R531 Weakness: Secondary | ICD-10-CM | POA: Diagnosis not present

## 2023-07-20 DIAGNOSIS — F419 Anxiety disorder, unspecified: Secondary | ICD-10-CM | POA: Diagnosis not present

## 2023-07-20 DIAGNOSIS — F1021 Alcohol dependence, in remission: Secondary | ICD-10-CM | POA: Diagnosis not present

## 2023-07-20 DIAGNOSIS — R519 Headache, unspecified: Secondary | ICD-10-CM | POA: Diagnosis not present

## 2023-07-20 DIAGNOSIS — R296 Repeated falls: Secondary | ICD-10-CM | POA: Diagnosis not present

## 2023-07-20 DIAGNOSIS — R918 Other nonspecific abnormal finding of lung field: Secondary | ICD-10-CM | POA: Diagnosis not present

## 2023-07-20 DIAGNOSIS — G43909 Migraine, unspecified, not intractable, without status migrainosus: Secondary | ICD-10-CM | POA: Diagnosis not present

## 2023-07-21 DIAGNOSIS — F419 Anxiety disorder, unspecified: Secondary | ICD-10-CM | POA: Diagnosis not present

## 2023-07-21 DIAGNOSIS — G43909 Migraine, unspecified, not intractable, without status migrainosus: Secondary | ICD-10-CM | POA: Diagnosis not present

## 2023-07-21 DIAGNOSIS — R55 Syncope and collapse: Secondary | ICD-10-CM | POA: Diagnosis not present

## 2023-07-21 DIAGNOSIS — R42 Dizziness and giddiness: Secondary | ICD-10-CM | POA: Diagnosis not present

## 2023-07-21 DIAGNOSIS — R410 Disorientation, unspecified: Secondary | ICD-10-CM | POA: Diagnosis not present

## 2023-07-22 DIAGNOSIS — R519 Headache, unspecified: Secondary | ICD-10-CM | POA: Diagnosis not present

## 2023-07-22 DIAGNOSIS — R4701 Aphasia: Secondary | ICD-10-CM | POA: Diagnosis not present

## 2023-07-22 DIAGNOSIS — R269 Unspecified abnormalities of gait and mobility: Secondary | ICD-10-CM | POA: Diagnosis not present

## 2023-07-22 DIAGNOSIS — R42 Dizziness and giddiness: Secondary | ICD-10-CM | POA: Diagnosis not present

## 2023-07-22 DIAGNOSIS — G43909 Migraine, unspecified, not intractable, without status migrainosus: Secondary | ICD-10-CM | POA: Diagnosis not present

## 2023-07-22 DIAGNOSIS — F419 Anxiety disorder, unspecified: Secondary | ICD-10-CM | POA: Diagnosis not present

## 2023-07-23 ENCOUNTER — Other Ambulatory Visit: Payer: Self-pay | Admitting: Allergy and Immunology

## 2023-07-23 DIAGNOSIS — D751 Secondary polycythemia: Secondary | ICD-10-CM

## 2023-07-25 DIAGNOSIS — R2689 Other abnormalities of gait and mobility: Secondary | ICD-10-CM | POA: Diagnosis not present

## 2023-07-25 DIAGNOSIS — N401 Enlarged prostate with lower urinary tract symptoms: Secondary | ICD-10-CM | POA: Diagnosis not present

## 2023-07-25 DIAGNOSIS — E1169 Type 2 diabetes mellitus with other specified complication: Secondary | ICD-10-CM | POA: Diagnosis not present

## 2023-07-25 DIAGNOSIS — G43909 Migraine, unspecified, not intractable, without status migrainosus: Secondary | ICD-10-CM | POA: Diagnosis not present

## 2023-07-25 DIAGNOSIS — F5104 Psychophysiologic insomnia: Secondary | ICD-10-CM | POA: Diagnosis not present

## 2023-07-25 DIAGNOSIS — E785 Hyperlipidemia, unspecified: Secondary | ICD-10-CM | POA: Diagnosis not present

## 2023-07-25 DIAGNOSIS — F419 Anxiety disorder, unspecified: Secondary | ICD-10-CM | POA: Diagnosis not present

## 2023-07-25 DIAGNOSIS — R42 Dizziness and giddiness: Secondary | ICD-10-CM | POA: Diagnosis not present

## 2023-07-27 DIAGNOSIS — D751 Secondary polycythemia: Secondary | ICD-10-CM | POA: Diagnosis not present

## 2023-07-30 DIAGNOSIS — R42 Dizziness and giddiness: Secondary | ICD-10-CM | POA: Diagnosis not present

## 2023-07-30 DIAGNOSIS — F339 Major depressive disorder, recurrent, unspecified: Secondary | ICD-10-CM | POA: Diagnosis not present

## 2023-07-30 DIAGNOSIS — J449 Chronic obstructive pulmonary disease, unspecified: Secondary | ICD-10-CM | POA: Diagnosis not present

## 2023-07-30 DIAGNOSIS — N400 Enlarged prostate without lower urinary tract symptoms: Secondary | ICD-10-CM | POA: Diagnosis not present

## 2023-07-30 DIAGNOSIS — I10 Essential (primary) hypertension: Secondary | ICD-10-CM | POA: Diagnosis not present

## 2023-07-30 DIAGNOSIS — F419 Anxiety disorder, unspecified: Secondary | ICD-10-CM | POA: Diagnosis not present

## 2023-07-30 DIAGNOSIS — H8103 Meniere's disease, bilateral: Secondary | ICD-10-CM | POA: Diagnosis not present

## 2023-07-30 DIAGNOSIS — E119 Type 2 diabetes mellitus without complications: Secondary | ICD-10-CM | POA: Diagnosis not present

## 2023-07-30 DIAGNOSIS — K219 Gastro-esophageal reflux disease without esophagitis: Secondary | ICD-10-CM | POA: Diagnosis not present

## 2023-07-31 DIAGNOSIS — J449 Chronic obstructive pulmonary disease, unspecified: Secondary | ICD-10-CM | POA: Diagnosis not present

## 2023-07-31 DIAGNOSIS — K219 Gastro-esophageal reflux disease without esophagitis: Secondary | ICD-10-CM | POA: Diagnosis not present

## 2023-07-31 DIAGNOSIS — H8103 Meniere's disease, bilateral: Secondary | ICD-10-CM | POA: Diagnosis not present

## 2023-07-31 DIAGNOSIS — R42 Dizziness and giddiness: Secondary | ICD-10-CM | POA: Diagnosis not present

## 2023-07-31 DIAGNOSIS — F419 Anxiety disorder, unspecified: Secondary | ICD-10-CM | POA: Diagnosis not present

## 2023-07-31 DIAGNOSIS — E119 Type 2 diabetes mellitus without complications: Secondary | ICD-10-CM | POA: Diagnosis not present

## 2023-07-31 DIAGNOSIS — F339 Major depressive disorder, recurrent, unspecified: Secondary | ICD-10-CM | POA: Diagnosis not present

## 2023-07-31 DIAGNOSIS — I10 Essential (primary) hypertension: Secondary | ICD-10-CM | POA: Diagnosis not present

## 2023-07-31 DIAGNOSIS — N400 Enlarged prostate without lower urinary tract symptoms: Secondary | ICD-10-CM | POA: Diagnosis not present

## 2023-08-06 DIAGNOSIS — K219 Gastro-esophageal reflux disease without esophagitis: Secondary | ICD-10-CM | POA: Diagnosis not present

## 2023-08-06 DIAGNOSIS — F419 Anxiety disorder, unspecified: Secondary | ICD-10-CM | POA: Diagnosis not present

## 2023-08-06 DIAGNOSIS — H8103 Meniere's disease, bilateral: Secondary | ICD-10-CM | POA: Diagnosis not present

## 2023-08-06 DIAGNOSIS — F339 Major depressive disorder, recurrent, unspecified: Secondary | ICD-10-CM | POA: Diagnosis not present

## 2023-08-06 DIAGNOSIS — J449 Chronic obstructive pulmonary disease, unspecified: Secondary | ICD-10-CM | POA: Diagnosis not present

## 2023-08-06 DIAGNOSIS — R42 Dizziness and giddiness: Secondary | ICD-10-CM | POA: Diagnosis not present

## 2023-08-06 DIAGNOSIS — N400 Enlarged prostate without lower urinary tract symptoms: Secondary | ICD-10-CM | POA: Diagnosis not present

## 2023-08-06 DIAGNOSIS — I10 Essential (primary) hypertension: Secondary | ICD-10-CM | POA: Diagnosis not present

## 2023-08-06 DIAGNOSIS — E119 Type 2 diabetes mellitus without complications: Secondary | ICD-10-CM | POA: Diagnosis not present

## 2023-08-06 LAB — JAK2 V617F RFX CALR/MPL/E12-15

## 2023-08-06 LAB — CALR +MPL + E12-E15  (REFLEX)

## 2023-08-06 LAB — IRON,TIBC AND FERRITIN PANEL
Ferritin: 111 ng/mL (ref 30–400)
Iron Saturation: 32 % (ref 15–55)
Iron: 111 ug/dL (ref 38–169)
Total Iron Binding Capacity: 344 ug/dL (ref 250–450)
UIBC: 233 ug/dL (ref 111–343)

## 2023-08-07 DIAGNOSIS — F339 Major depressive disorder, recurrent, unspecified: Secondary | ICD-10-CM | POA: Diagnosis not present

## 2023-08-07 DIAGNOSIS — N400 Enlarged prostate without lower urinary tract symptoms: Secondary | ICD-10-CM | POA: Diagnosis not present

## 2023-08-07 DIAGNOSIS — J449 Chronic obstructive pulmonary disease, unspecified: Secondary | ICD-10-CM | POA: Diagnosis not present

## 2023-08-07 DIAGNOSIS — F419 Anxiety disorder, unspecified: Secondary | ICD-10-CM | POA: Diagnosis not present

## 2023-08-07 DIAGNOSIS — E119 Type 2 diabetes mellitus without complications: Secondary | ICD-10-CM | POA: Diagnosis not present

## 2023-08-07 DIAGNOSIS — K219 Gastro-esophageal reflux disease without esophagitis: Secondary | ICD-10-CM | POA: Diagnosis not present

## 2023-08-07 DIAGNOSIS — I10 Essential (primary) hypertension: Secondary | ICD-10-CM | POA: Diagnosis not present

## 2023-08-07 DIAGNOSIS — H8103 Meniere's disease, bilateral: Secondary | ICD-10-CM | POA: Diagnosis not present

## 2023-08-07 DIAGNOSIS — R42 Dizziness and giddiness: Secondary | ICD-10-CM | POA: Diagnosis not present

## 2023-08-15 DIAGNOSIS — F339 Major depressive disorder, recurrent, unspecified: Secondary | ICD-10-CM | POA: Diagnosis not present

## 2023-08-15 DIAGNOSIS — K219 Gastro-esophageal reflux disease without esophagitis: Secondary | ICD-10-CM | POA: Diagnosis not present

## 2023-08-15 DIAGNOSIS — H8103 Meniere's disease, bilateral: Secondary | ICD-10-CM | POA: Diagnosis not present

## 2023-08-15 DIAGNOSIS — J449 Chronic obstructive pulmonary disease, unspecified: Secondary | ICD-10-CM | POA: Diagnosis not present

## 2023-08-15 DIAGNOSIS — N400 Enlarged prostate without lower urinary tract symptoms: Secondary | ICD-10-CM | POA: Diagnosis not present

## 2023-08-15 DIAGNOSIS — I10 Essential (primary) hypertension: Secondary | ICD-10-CM | POA: Diagnosis not present

## 2023-08-15 DIAGNOSIS — F419 Anxiety disorder, unspecified: Secondary | ICD-10-CM | POA: Diagnosis not present

## 2023-08-15 DIAGNOSIS — R42 Dizziness and giddiness: Secondary | ICD-10-CM | POA: Diagnosis not present

## 2023-08-15 DIAGNOSIS — E119 Type 2 diabetes mellitus without complications: Secondary | ICD-10-CM | POA: Diagnosis not present

## 2023-08-17 DIAGNOSIS — F339 Major depressive disorder, recurrent, unspecified: Secondary | ICD-10-CM | POA: Diagnosis not present

## 2023-08-17 DIAGNOSIS — R42 Dizziness and giddiness: Secondary | ICD-10-CM | POA: Diagnosis not present

## 2023-08-17 DIAGNOSIS — K219 Gastro-esophageal reflux disease without esophagitis: Secondary | ICD-10-CM | POA: Diagnosis not present

## 2023-08-17 DIAGNOSIS — I10 Essential (primary) hypertension: Secondary | ICD-10-CM | POA: Diagnosis not present

## 2023-08-17 DIAGNOSIS — N400 Enlarged prostate without lower urinary tract symptoms: Secondary | ICD-10-CM | POA: Diagnosis not present

## 2023-08-17 DIAGNOSIS — E119 Type 2 diabetes mellitus without complications: Secondary | ICD-10-CM | POA: Diagnosis not present

## 2023-08-17 DIAGNOSIS — J449 Chronic obstructive pulmonary disease, unspecified: Secondary | ICD-10-CM | POA: Diagnosis not present

## 2023-08-17 DIAGNOSIS — F419 Anxiety disorder, unspecified: Secondary | ICD-10-CM | POA: Diagnosis not present

## 2023-08-17 DIAGNOSIS — H8103 Meniere's disease, bilateral: Secondary | ICD-10-CM | POA: Diagnosis not present

## 2023-08-18 DIAGNOSIS — N401 Enlarged prostate with lower urinary tract symptoms: Secondary | ICD-10-CM | POA: Diagnosis not present

## 2023-08-18 DIAGNOSIS — Z6833 Body mass index (BMI) 33.0-33.9, adult: Secondary | ICD-10-CM | POA: Diagnosis not present

## 2023-08-18 DIAGNOSIS — K219 Gastro-esophageal reflux disease without esophagitis: Secondary | ICD-10-CM | POA: Diagnosis not present

## 2023-08-18 DIAGNOSIS — Z125 Encounter for screening for malignant neoplasm of prostate: Secondary | ICD-10-CM | POA: Diagnosis not present

## 2023-08-18 DIAGNOSIS — E1169 Type 2 diabetes mellitus with other specified complication: Secondary | ICD-10-CM | POA: Diagnosis not present

## 2023-08-18 DIAGNOSIS — F419 Anxiety disorder, unspecified: Secondary | ICD-10-CM | POA: Diagnosis not present

## 2023-08-18 DIAGNOSIS — F5104 Psychophysiologic insomnia: Secondary | ICD-10-CM | POA: Diagnosis not present

## 2023-08-18 DIAGNOSIS — Z23 Encounter for immunization: Secondary | ICD-10-CM | POA: Diagnosis not present

## 2023-08-18 DIAGNOSIS — G43909 Migraine, unspecified, not intractable, without status migrainosus: Secondary | ICD-10-CM | POA: Diagnosis not present

## 2023-08-18 DIAGNOSIS — E785 Hyperlipidemia, unspecified: Secondary | ICD-10-CM | POA: Diagnosis not present

## 2023-08-23 DIAGNOSIS — G4733 Obstructive sleep apnea (adult) (pediatric): Secondary | ICD-10-CM | POA: Diagnosis not present

## 2023-08-24 DIAGNOSIS — E119 Type 2 diabetes mellitus without complications: Secondary | ICD-10-CM | POA: Diagnosis not present

## 2023-08-24 DIAGNOSIS — F339 Major depressive disorder, recurrent, unspecified: Secondary | ICD-10-CM | POA: Diagnosis not present

## 2023-08-24 DIAGNOSIS — J449 Chronic obstructive pulmonary disease, unspecified: Secondary | ICD-10-CM | POA: Diagnosis not present

## 2023-08-24 DIAGNOSIS — F419 Anxiety disorder, unspecified: Secondary | ICD-10-CM | POA: Diagnosis not present

## 2023-08-24 DIAGNOSIS — H8103 Meniere's disease, bilateral: Secondary | ICD-10-CM | POA: Diagnosis not present

## 2023-08-24 DIAGNOSIS — N400 Enlarged prostate without lower urinary tract symptoms: Secondary | ICD-10-CM | POA: Diagnosis not present

## 2023-08-24 DIAGNOSIS — K219 Gastro-esophageal reflux disease without esophagitis: Secondary | ICD-10-CM | POA: Diagnosis not present

## 2023-08-24 DIAGNOSIS — R42 Dizziness and giddiness: Secondary | ICD-10-CM | POA: Diagnosis not present

## 2023-08-24 DIAGNOSIS — I10 Essential (primary) hypertension: Secondary | ICD-10-CM | POA: Diagnosis not present

## 2023-08-27 ENCOUNTER — Institutional Professional Consult (permissible substitution) (INDEPENDENT_AMBULATORY_CARE_PROVIDER_SITE_OTHER): Payer: Medicare HMO

## 2023-08-27 ENCOUNTER — Institutional Professional Consult (permissible substitution) (INDEPENDENT_AMBULATORY_CARE_PROVIDER_SITE_OTHER): Payer: Medicare HMO | Admitting: Otolaryngology

## 2023-08-27 DIAGNOSIS — F339 Major depressive disorder, recurrent, unspecified: Secondary | ICD-10-CM | POA: Diagnosis not present

## 2023-08-27 DIAGNOSIS — J449 Chronic obstructive pulmonary disease, unspecified: Secondary | ICD-10-CM | POA: Diagnosis not present

## 2023-08-27 DIAGNOSIS — F419 Anxiety disorder, unspecified: Secondary | ICD-10-CM | POA: Diagnosis not present

## 2023-08-27 DIAGNOSIS — N400 Enlarged prostate without lower urinary tract symptoms: Secondary | ICD-10-CM | POA: Diagnosis not present

## 2023-08-27 DIAGNOSIS — R42 Dizziness and giddiness: Secondary | ICD-10-CM | POA: Diagnosis not present

## 2023-08-27 DIAGNOSIS — I10 Essential (primary) hypertension: Secondary | ICD-10-CM | POA: Diagnosis not present

## 2023-08-27 DIAGNOSIS — E119 Type 2 diabetes mellitus without complications: Secondary | ICD-10-CM | POA: Diagnosis not present

## 2023-08-27 DIAGNOSIS — H8103 Meniere's disease, bilateral: Secondary | ICD-10-CM | POA: Diagnosis not present

## 2023-08-27 DIAGNOSIS — K219 Gastro-esophageal reflux disease without esophagitis: Secondary | ICD-10-CM | POA: Diagnosis not present

## 2023-08-28 DIAGNOSIS — J449 Chronic obstructive pulmonary disease, unspecified: Secondary | ICD-10-CM | POA: Diagnosis not present

## 2023-08-28 DIAGNOSIS — F339 Major depressive disorder, recurrent, unspecified: Secondary | ICD-10-CM | POA: Diagnosis not present

## 2023-08-28 DIAGNOSIS — N400 Enlarged prostate without lower urinary tract symptoms: Secondary | ICD-10-CM | POA: Diagnosis not present

## 2023-08-28 DIAGNOSIS — H8103 Meniere's disease, bilateral: Secondary | ICD-10-CM | POA: Diagnosis not present

## 2023-08-28 DIAGNOSIS — R42 Dizziness and giddiness: Secondary | ICD-10-CM | POA: Diagnosis not present

## 2023-08-28 DIAGNOSIS — F419 Anxiety disorder, unspecified: Secondary | ICD-10-CM | POA: Diagnosis not present

## 2023-08-28 DIAGNOSIS — E119 Type 2 diabetes mellitus without complications: Secondary | ICD-10-CM | POA: Diagnosis not present

## 2023-08-28 DIAGNOSIS — K219 Gastro-esophageal reflux disease without esophagitis: Secondary | ICD-10-CM | POA: Diagnosis not present

## 2023-08-28 DIAGNOSIS — I10 Essential (primary) hypertension: Secondary | ICD-10-CM | POA: Diagnosis not present

## 2023-08-29 DIAGNOSIS — G4733 Obstructive sleep apnea (adult) (pediatric): Secondary | ICD-10-CM | POA: Diagnosis not present

## 2023-08-29 DIAGNOSIS — R5383 Other fatigue: Secondary | ICD-10-CM | POA: Diagnosis not present

## 2023-08-29 DIAGNOSIS — R400 Somnolence: Secondary | ICD-10-CM | POA: Diagnosis not present

## 2023-08-29 DIAGNOSIS — J4540 Moderate persistent asthma, uncomplicated: Secondary | ICD-10-CM | POA: Diagnosis not present

## 2023-09-05 DIAGNOSIS — I10 Essential (primary) hypertension: Secondary | ICD-10-CM | POA: Diagnosis not present

## 2023-09-05 DIAGNOSIS — R42 Dizziness and giddiness: Secondary | ICD-10-CM | POA: Diagnosis not present

## 2023-09-05 DIAGNOSIS — K219 Gastro-esophageal reflux disease without esophagitis: Secondary | ICD-10-CM | POA: Diagnosis not present

## 2023-09-05 DIAGNOSIS — H8103 Meniere's disease, bilateral: Secondary | ICD-10-CM | POA: Diagnosis not present

## 2023-09-05 DIAGNOSIS — J449 Chronic obstructive pulmonary disease, unspecified: Secondary | ICD-10-CM | POA: Diagnosis not present

## 2023-09-05 DIAGNOSIS — E119 Type 2 diabetes mellitus without complications: Secondary | ICD-10-CM | POA: Diagnosis not present

## 2023-09-05 DIAGNOSIS — F339 Major depressive disorder, recurrent, unspecified: Secondary | ICD-10-CM | POA: Diagnosis not present

## 2023-09-05 DIAGNOSIS — F419 Anxiety disorder, unspecified: Secondary | ICD-10-CM | POA: Diagnosis not present

## 2023-09-05 DIAGNOSIS — N400 Enlarged prostate without lower urinary tract symptoms: Secondary | ICD-10-CM | POA: Diagnosis not present

## 2023-09-10 DIAGNOSIS — R42 Dizziness and giddiness: Secondary | ICD-10-CM | POA: Diagnosis not present

## 2023-09-10 DIAGNOSIS — N400 Enlarged prostate without lower urinary tract symptoms: Secondary | ICD-10-CM | POA: Diagnosis not present

## 2023-09-10 DIAGNOSIS — K219 Gastro-esophageal reflux disease without esophagitis: Secondary | ICD-10-CM | POA: Diagnosis not present

## 2023-09-10 DIAGNOSIS — E119 Type 2 diabetes mellitus without complications: Secondary | ICD-10-CM | POA: Diagnosis not present

## 2023-09-10 DIAGNOSIS — F419 Anxiety disorder, unspecified: Secondary | ICD-10-CM | POA: Diagnosis not present

## 2023-09-10 DIAGNOSIS — I10 Essential (primary) hypertension: Secondary | ICD-10-CM | POA: Diagnosis not present

## 2023-09-10 DIAGNOSIS — J449 Chronic obstructive pulmonary disease, unspecified: Secondary | ICD-10-CM | POA: Diagnosis not present

## 2023-09-10 DIAGNOSIS — H8103 Meniere's disease, bilateral: Secondary | ICD-10-CM | POA: Diagnosis not present

## 2023-09-10 DIAGNOSIS — F339 Major depressive disorder, recurrent, unspecified: Secondary | ICD-10-CM | POA: Diagnosis not present

## 2023-09-11 DIAGNOSIS — N400 Enlarged prostate without lower urinary tract symptoms: Secondary | ICD-10-CM | POA: Diagnosis not present

## 2023-09-11 DIAGNOSIS — E119 Type 2 diabetes mellitus without complications: Secondary | ICD-10-CM | POA: Diagnosis not present

## 2023-09-11 DIAGNOSIS — J449 Chronic obstructive pulmonary disease, unspecified: Secondary | ICD-10-CM | POA: Diagnosis not present

## 2023-09-11 DIAGNOSIS — F339 Major depressive disorder, recurrent, unspecified: Secondary | ICD-10-CM | POA: Diagnosis not present

## 2023-09-11 DIAGNOSIS — I10 Essential (primary) hypertension: Secondary | ICD-10-CM | POA: Diagnosis not present

## 2023-09-11 DIAGNOSIS — F419 Anxiety disorder, unspecified: Secondary | ICD-10-CM | POA: Diagnosis not present

## 2023-09-11 DIAGNOSIS — K219 Gastro-esophageal reflux disease without esophagitis: Secondary | ICD-10-CM | POA: Diagnosis not present

## 2023-09-11 DIAGNOSIS — H8103 Meniere's disease, bilateral: Secondary | ICD-10-CM | POA: Diagnosis not present

## 2023-09-11 DIAGNOSIS — R42 Dizziness and giddiness: Secondary | ICD-10-CM | POA: Diagnosis not present

## 2023-09-14 DIAGNOSIS — M546 Pain in thoracic spine: Secondary | ICD-10-CM | POA: Diagnosis not present

## 2023-09-14 DIAGNOSIS — E11649 Type 2 diabetes mellitus with hypoglycemia without coma: Secondary | ICD-10-CM | POA: Diagnosis not present

## 2023-09-17 DIAGNOSIS — F419 Anxiety disorder, unspecified: Secondary | ICD-10-CM | POA: Diagnosis not present

## 2023-09-17 DIAGNOSIS — N400 Enlarged prostate without lower urinary tract symptoms: Secondary | ICD-10-CM | POA: Diagnosis not present

## 2023-09-17 DIAGNOSIS — F339 Major depressive disorder, recurrent, unspecified: Secondary | ICD-10-CM | POA: Diagnosis not present

## 2023-09-17 DIAGNOSIS — J449 Chronic obstructive pulmonary disease, unspecified: Secondary | ICD-10-CM | POA: Diagnosis not present

## 2023-09-17 DIAGNOSIS — H8103 Meniere's disease, bilateral: Secondary | ICD-10-CM | POA: Diagnosis not present

## 2023-09-17 DIAGNOSIS — E119 Type 2 diabetes mellitus without complications: Secondary | ICD-10-CM | POA: Diagnosis not present

## 2023-09-17 DIAGNOSIS — R42 Dizziness and giddiness: Secondary | ICD-10-CM | POA: Diagnosis not present

## 2023-09-17 DIAGNOSIS — I10 Essential (primary) hypertension: Secondary | ICD-10-CM | POA: Diagnosis not present

## 2023-09-17 DIAGNOSIS — K219 Gastro-esophageal reflux disease without esophagitis: Secondary | ICD-10-CM | POA: Diagnosis not present

## 2023-09-20 DIAGNOSIS — G609 Hereditary and idiopathic neuropathy, unspecified: Secondary | ICD-10-CM | POA: Diagnosis not present

## 2023-09-20 DIAGNOSIS — M546 Pain in thoracic spine: Secondary | ICD-10-CM | POA: Diagnosis not present

## 2023-09-23 DIAGNOSIS — M47814 Spondylosis without myelopathy or radiculopathy, thoracic region: Secondary | ICD-10-CM | POA: Diagnosis not present

## 2023-09-26 DIAGNOSIS — F339 Major depressive disorder, recurrent, unspecified: Secondary | ICD-10-CM | POA: Diagnosis not present

## 2023-09-26 DIAGNOSIS — N400 Enlarged prostate without lower urinary tract symptoms: Secondary | ICD-10-CM | POA: Diagnosis not present

## 2023-09-26 DIAGNOSIS — K219 Gastro-esophageal reflux disease without esophagitis: Secondary | ICD-10-CM | POA: Diagnosis not present

## 2023-09-26 DIAGNOSIS — H8103 Meniere's disease, bilateral: Secondary | ICD-10-CM | POA: Diagnosis not present

## 2023-09-26 DIAGNOSIS — F419 Anxiety disorder, unspecified: Secondary | ICD-10-CM | POA: Diagnosis not present

## 2023-09-26 DIAGNOSIS — J449 Chronic obstructive pulmonary disease, unspecified: Secondary | ICD-10-CM | POA: Diagnosis not present

## 2023-09-26 DIAGNOSIS — E119 Type 2 diabetes mellitus without complications: Secondary | ICD-10-CM | POA: Diagnosis not present

## 2023-09-26 DIAGNOSIS — R42 Dizziness and giddiness: Secondary | ICD-10-CM | POA: Diagnosis not present

## 2023-09-26 DIAGNOSIS — I10 Essential (primary) hypertension: Secondary | ICD-10-CM | POA: Diagnosis not present

## 2023-09-27 ENCOUNTER — Other Ambulatory Visit: Payer: Self-pay | Admitting: *Deleted

## 2023-09-27 DIAGNOSIS — F339 Major depressive disorder, recurrent, unspecified: Secondary | ICD-10-CM | POA: Diagnosis not present

## 2023-09-27 DIAGNOSIS — R42 Dizziness and giddiness: Secondary | ICD-10-CM | POA: Diagnosis not present

## 2023-09-27 DIAGNOSIS — E119 Type 2 diabetes mellitus without complications: Secondary | ICD-10-CM | POA: Diagnosis not present

## 2023-09-27 DIAGNOSIS — H8103 Meniere's disease, bilateral: Secondary | ICD-10-CM | POA: Diagnosis not present

## 2023-09-27 DIAGNOSIS — F419 Anxiety disorder, unspecified: Secondary | ICD-10-CM | POA: Diagnosis not present

## 2023-09-27 DIAGNOSIS — I10 Essential (primary) hypertension: Secondary | ICD-10-CM | POA: Diagnosis not present

## 2023-09-27 DIAGNOSIS — K219 Gastro-esophageal reflux disease without esophagitis: Secondary | ICD-10-CM | POA: Diagnosis not present

## 2023-09-27 DIAGNOSIS — J449 Chronic obstructive pulmonary disease, unspecified: Secondary | ICD-10-CM | POA: Diagnosis not present

## 2023-09-27 DIAGNOSIS — N400 Enlarged prostate without lower urinary tract symptoms: Secondary | ICD-10-CM | POA: Diagnosis not present

## 2023-09-27 MED ORDER — AIRSUPRA 90-80 MCG/ACT IN AERO: 2.0000 | INHALATION_SPRAY | RESPIRATORY_TRACT | 2 refills | Status: AC

## 2023-10-01 DIAGNOSIS — J208 Acute bronchitis due to other specified organisms: Secondary | ICD-10-CM | POA: Diagnosis not present

## 2023-10-01 DIAGNOSIS — Z20822 Contact with and (suspected) exposure to covid-19: Secondary | ICD-10-CM | POA: Diagnosis not present

## 2023-10-01 DIAGNOSIS — B9689 Other specified bacterial agents as the cause of diseases classified elsewhere: Secondary | ICD-10-CM | POA: Diagnosis not present

## 2023-10-01 DIAGNOSIS — J019 Acute sinusitis, unspecified: Secondary | ICD-10-CM | POA: Diagnosis not present

## 2023-10-05 DIAGNOSIS — H8103 Meniere's disease, bilateral: Secondary | ICD-10-CM | POA: Diagnosis not present

## 2023-10-05 DIAGNOSIS — N400 Enlarged prostate without lower urinary tract symptoms: Secondary | ICD-10-CM | POA: Diagnosis not present

## 2023-10-05 DIAGNOSIS — F339 Major depressive disorder, recurrent, unspecified: Secondary | ICD-10-CM | POA: Diagnosis not present

## 2023-10-05 DIAGNOSIS — R42 Dizziness and giddiness: Secondary | ICD-10-CM | POA: Diagnosis not present

## 2023-10-05 DIAGNOSIS — J449 Chronic obstructive pulmonary disease, unspecified: Secondary | ICD-10-CM | POA: Diagnosis not present

## 2023-10-05 DIAGNOSIS — F419 Anxiety disorder, unspecified: Secondary | ICD-10-CM | POA: Diagnosis not present

## 2023-10-05 DIAGNOSIS — K219 Gastro-esophageal reflux disease without esophagitis: Secondary | ICD-10-CM | POA: Diagnosis not present

## 2023-10-05 DIAGNOSIS — E119 Type 2 diabetes mellitus without complications: Secondary | ICD-10-CM | POA: Diagnosis not present

## 2023-10-05 DIAGNOSIS — I10 Essential (primary) hypertension: Secondary | ICD-10-CM | POA: Diagnosis not present

## 2023-10-08 DIAGNOSIS — E119 Type 2 diabetes mellitus without complications: Secondary | ICD-10-CM | POA: Diagnosis not present

## 2023-10-08 DIAGNOSIS — H8103 Meniere's disease, bilateral: Secondary | ICD-10-CM | POA: Diagnosis not present

## 2023-10-08 DIAGNOSIS — I10 Essential (primary) hypertension: Secondary | ICD-10-CM | POA: Diagnosis not present

## 2023-10-08 DIAGNOSIS — N400 Enlarged prostate without lower urinary tract symptoms: Secondary | ICD-10-CM | POA: Diagnosis not present

## 2023-10-08 DIAGNOSIS — F419 Anxiety disorder, unspecified: Secondary | ICD-10-CM | POA: Diagnosis not present

## 2023-10-08 DIAGNOSIS — J449 Chronic obstructive pulmonary disease, unspecified: Secondary | ICD-10-CM | POA: Diagnosis not present

## 2023-10-08 DIAGNOSIS — R42 Dizziness and giddiness: Secondary | ICD-10-CM | POA: Diagnosis not present

## 2023-10-08 DIAGNOSIS — K219 Gastro-esophageal reflux disease without esophagitis: Secondary | ICD-10-CM | POA: Diagnosis not present

## 2023-10-08 DIAGNOSIS — F339 Major depressive disorder, recurrent, unspecified: Secondary | ICD-10-CM | POA: Diagnosis not present

## 2023-10-15 DIAGNOSIS — E119 Type 2 diabetes mellitus without complications: Secondary | ICD-10-CM | POA: Diagnosis not present

## 2023-10-15 DIAGNOSIS — F339 Major depressive disorder, recurrent, unspecified: Secondary | ICD-10-CM | POA: Diagnosis not present

## 2023-10-15 DIAGNOSIS — F419 Anxiety disorder, unspecified: Secondary | ICD-10-CM | POA: Diagnosis not present

## 2023-10-15 DIAGNOSIS — I10 Essential (primary) hypertension: Secondary | ICD-10-CM | POA: Diagnosis not present

## 2023-10-15 DIAGNOSIS — N400 Enlarged prostate without lower urinary tract symptoms: Secondary | ICD-10-CM | POA: Diagnosis not present

## 2023-10-15 DIAGNOSIS — J449 Chronic obstructive pulmonary disease, unspecified: Secondary | ICD-10-CM | POA: Diagnosis not present

## 2023-10-15 DIAGNOSIS — H8103 Meniere's disease, bilateral: Secondary | ICD-10-CM | POA: Diagnosis not present

## 2023-10-15 DIAGNOSIS — R42 Dizziness and giddiness: Secondary | ICD-10-CM | POA: Diagnosis not present

## 2023-10-15 DIAGNOSIS — K219 Gastro-esophageal reflux disease without esophagitis: Secondary | ICD-10-CM | POA: Diagnosis not present

## 2023-10-22 DIAGNOSIS — E119 Type 2 diabetes mellitus without complications: Secondary | ICD-10-CM | POA: Diagnosis not present

## 2023-10-22 DIAGNOSIS — F419 Anxiety disorder, unspecified: Secondary | ICD-10-CM | POA: Diagnosis not present

## 2023-10-22 DIAGNOSIS — I10 Essential (primary) hypertension: Secondary | ICD-10-CM | POA: Diagnosis not present

## 2023-10-22 DIAGNOSIS — K219 Gastro-esophageal reflux disease without esophagitis: Secondary | ICD-10-CM | POA: Diagnosis not present

## 2023-10-22 DIAGNOSIS — N400 Enlarged prostate without lower urinary tract symptoms: Secondary | ICD-10-CM | POA: Diagnosis not present

## 2023-10-22 DIAGNOSIS — H8103 Meniere's disease, bilateral: Secondary | ICD-10-CM | POA: Diagnosis not present

## 2023-10-22 DIAGNOSIS — J449 Chronic obstructive pulmonary disease, unspecified: Secondary | ICD-10-CM | POA: Diagnosis not present

## 2023-10-22 DIAGNOSIS — F339 Major depressive disorder, recurrent, unspecified: Secondary | ICD-10-CM | POA: Diagnosis not present

## 2023-10-22 DIAGNOSIS — R42 Dizziness and giddiness: Secondary | ICD-10-CM | POA: Diagnosis not present

## 2023-10-24 ENCOUNTER — Other Ambulatory Visit: Payer: Self-pay | Admitting: *Deleted

## 2023-10-24 ENCOUNTER — Telehealth: Payer: Self-pay | Admitting: Allergy and Immunology

## 2023-10-24 DIAGNOSIS — D839 Common variable immunodeficiency, unspecified: Secondary | ICD-10-CM

## 2023-10-24 DIAGNOSIS — J324 Chronic pansinusitis: Secondary | ICD-10-CM

## 2023-10-24 NOTE — Telephone Encounter (Signed)
Patient states he has been getting a lot of sinus infections and constant rounds of antibiotics that are not helping. He wants to know if Dr. Lucie Leather would be OK reordering blood work to check if the infusions are helping.

## 2023-10-24 NOTE — Telephone Encounter (Signed)
Labs ordered and [facility] informed.

## 2023-10-24 NOTE — Telephone Encounter (Signed)
Please advise 

## 2023-10-25 DIAGNOSIS — D839 Common variable immunodeficiency, unspecified: Secondary | ICD-10-CM | POA: Diagnosis not present

## 2023-10-25 DIAGNOSIS — J324 Chronic pansinusitis: Secondary | ICD-10-CM | POA: Diagnosis not present

## 2023-10-26 LAB — IGG, IGA, IGM
IgA/Immunoglobulin A, Serum: <5 mg/dL — ABNORMAL LOW (ref 61–437)
IgG (Immunoglobin G), Serum: 1036 mg/dL (ref 603–1613)
IgM (Immunoglobulin M), Srm: 16 mg/dL — ABNORMAL LOW (ref 20–172)

## 2023-10-30 DIAGNOSIS — F339 Major depressive disorder, recurrent, unspecified: Secondary | ICD-10-CM | POA: Diagnosis not present

## 2023-10-30 DIAGNOSIS — R42 Dizziness and giddiness: Secondary | ICD-10-CM | POA: Diagnosis not present

## 2023-10-30 DIAGNOSIS — H8103 Meniere's disease, bilateral: Secondary | ICD-10-CM | POA: Diagnosis not present

## 2023-10-30 DIAGNOSIS — E119 Type 2 diabetes mellitus without complications: Secondary | ICD-10-CM | POA: Diagnosis not present

## 2023-10-30 DIAGNOSIS — N400 Enlarged prostate without lower urinary tract symptoms: Secondary | ICD-10-CM | POA: Diagnosis not present

## 2023-10-30 DIAGNOSIS — J449 Chronic obstructive pulmonary disease, unspecified: Secondary | ICD-10-CM | POA: Diagnosis not present

## 2023-10-30 DIAGNOSIS — I10 Essential (primary) hypertension: Secondary | ICD-10-CM | POA: Diagnosis not present

## 2023-10-30 DIAGNOSIS — F419 Anxiety disorder, unspecified: Secondary | ICD-10-CM | POA: Diagnosis not present

## 2023-10-30 DIAGNOSIS — K219 Gastro-esophageal reflux disease without esophagitis: Secondary | ICD-10-CM | POA: Diagnosis not present

## 2023-10-31 DIAGNOSIS — L578 Other skin changes due to chronic exposure to nonionizing radiation: Secondary | ICD-10-CM | POA: Diagnosis not present

## 2023-10-31 DIAGNOSIS — C44319 Basal cell carcinoma of skin of other parts of face: Secondary | ICD-10-CM | POA: Diagnosis not present

## 2023-10-31 DIAGNOSIS — L820 Inflamed seborrheic keratosis: Secondary | ICD-10-CM | POA: Diagnosis not present

## 2023-10-31 DIAGNOSIS — L570 Actinic keratosis: Secondary | ICD-10-CM | POA: Diagnosis not present

## 2023-10-31 DIAGNOSIS — D225 Melanocytic nevi of trunk: Secondary | ICD-10-CM | POA: Diagnosis not present

## 2023-10-31 DIAGNOSIS — L814 Other melanin hyperpigmentation: Secondary | ICD-10-CM | POA: Diagnosis not present

## 2023-11-02 DIAGNOSIS — I951 Orthostatic hypotension: Secondary | ICD-10-CM | POA: Diagnosis not present

## 2023-11-05 DIAGNOSIS — E119 Type 2 diabetes mellitus without complications: Secondary | ICD-10-CM | POA: Diagnosis not present

## 2023-11-05 DIAGNOSIS — R42 Dizziness and giddiness: Secondary | ICD-10-CM | POA: Diagnosis not present

## 2023-11-05 DIAGNOSIS — H8103 Meniere's disease, bilateral: Secondary | ICD-10-CM | POA: Diagnosis not present

## 2023-11-05 DIAGNOSIS — K219 Gastro-esophageal reflux disease without esophagitis: Secondary | ICD-10-CM | POA: Diagnosis not present

## 2023-11-05 DIAGNOSIS — I10 Essential (primary) hypertension: Secondary | ICD-10-CM | POA: Diagnosis not present

## 2023-11-05 DIAGNOSIS — F419 Anxiety disorder, unspecified: Secondary | ICD-10-CM | POA: Diagnosis not present

## 2023-11-05 DIAGNOSIS — F339 Major depressive disorder, recurrent, unspecified: Secondary | ICD-10-CM | POA: Diagnosis not present

## 2023-11-05 DIAGNOSIS — J449 Chronic obstructive pulmonary disease, unspecified: Secondary | ICD-10-CM | POA: Diagnosis not present

## 2023-11-05 DIAGNOSIS — N400 Enlarged prostate without lower urinary tract symptoms: Secondary | ICD-10-CM | POA: Diagnosis not present

## 2023-11-15 DIAGNOSIS — F339 Major depressive disorder, recurrent, unspecified: Secondary | ICD-10-CM | POA: Diagnosis not present

## 2023-11-15 DIAGNOSIS — R42 Dizziness and giddiness: Secondary | ICD-10-CM | POA: Diagnosis not present

## 2023-11-15 DIAGNOSIS — J449 Chronic obstructive pulmonary disease, unspecified: Secondary | ICD-10-CM | POA: Diagnosis not present

## 2023-11-15 DIAGNOSIS — E119 Type 2 diabetes mellitus without complications: Secondary | ICD-10-CM | POA: Diagnosis not present

## 2023-11-15 DIAGNOSIS — N400 Enlarged prostate without lower urinary tract symptoms: Secondary | ICD-10-CM | POA: Diagnosis not present

## 2023-11-15 DIAGNOSIS — H8103 Meniere's disease, bilateral: Secondary | ICD-10-CM | POA: Diagnosis not present

## 2023-11-15 DIAGNOSIS — I10 Essential (primary) hypertension: Secondary | ICD-10-CM | POA: Diagnosis not present

## 2023-11-15 DIAGNOSIS — K219 Gastro-esophageal reflux disease without esophagitis: Secondary | ICD-10-CM | POA: Diagnosis not present

## 2023-11-15 DIAGNOSIS — F419 Anxiety disorder, unspecified: Secondary | ICD-10-CM | POA: Diagnosis not present

## 2023-11-19 DIAGNOSIS — B9689 Other specified bacterial agents as the cause of diseases classified elsewhere: Secondary | ICD-10-CM | POA: Diagnosis not present

## 2023-11-19 DIAGNOSIS — J019 Acute sinusitis, unspecified: Secondary | ICD-10-CM | POA: Diagnosis not present

## 2023-11-19 DIAGNOSIS — H8103 Meniere's disease, bilateral: Secondary | ICD-10-CM | POA: Diagnosis not present

## 2023-11-19 DIAGNOSIS — R42 Dizziness and giddiness: Secondary | ICD-10-CM | POA: Diagnosis not present

## 2023-11-22 DIAGNOSIS — F339 Major depressive disorder, recurrent, unspecified: Secondary | ICD-10-CM | POA: Diagnosis not present

## 2023-11-22 DIAGNOSIS — I10 Essential (primary) hypertension: Secondary | ICD-10-CM | POA: Diagnosis not present

## 2023-11-22 DIAGNOSIS — J449 Chronic obstructive pulmonary disease, unspecified: Secondary | ICD-10-CM | POA: Diagnosis not present

## 2023-11-22 DIAGNOSIS — R42 Dizziness and giddiness: Secondary | ICD-10-CM | POA: Diagnosis not present

## 2023-11-22 DIAGNOSIS — F419 Anxiety disorder, unspecified: Secondary | ICD-10-CM | POA: Diagnosis not present

## 2023-11-22 DIAGNOSIS — N400 Enlarged prostate without lower urinary tract symptoms: Secondary | ICD-10-CM | POA: Diagnosis not present

## 2023-11-22 DIAGNOSIS — K219 Gastro-esophageal reflux disease without esophagitis: Secondary | ICD-10-CM | POA: Diagnosis not present

## 2023-11-22 DIAGNOSIS — H8103 Meniere's disease, bilateral: Secondary | ICD-10-CM | POA: Diagnosis not present

## 2023-11-22 DIAGNOSIS — E119 Type 2 diabetes mellitus without complications: Secondary | ICD-10-CM | POA: Diagnosis not present

## 2023-12-03 DIAGNOSIS — K219 Gastro-esophageal reflux disease without esophagitis: Secondary | ICD-10-CM | POA: Diagnosis not present

## 2023-12-03 DIAGNOSIS — R1310 Dysphagia, unspecified: Secondary | ICD-10-CM | POA: Diagnosis not present

## 2023-12-03 DIAGNOSIS — K5900 Constipation, unspecified: Secondary | ICD-10-CM | POA: Diagnosis not present

## 2023-12-08 DIAGNOSIS — E1169 Type 2 diabetes mellitus with other specified complication: Secondary | ICD-10-CM | POA: Diagnosis not present

## 2023-12-08 DIAGNOSIS — N401 Enlarged prostate with lower urinary tract symptoms: Secondary | ICD-10-CM | POA: Diagnosis not present

## 2023-12-08 DIAGNOSIS — F419 Anxiety disorder, unspecified: Secondary | ICD-10-CM | POA: Diagnosis not present

## 2023-12-08 DIAGNOSIS — F5104 Psychophysiologic insomnia: Secondary | ICD-10-CM | POA: Diagnosis not present

## 2023-12-08 DIAGNOSIS — E785 Hyperlipidemia, unspecified: Secondary | ICD-10-CM | POA: Diagnosis not present

## 2023-12-08 DIAGNOSIS — G43909 Migraine, unspecified, not intractable, without status migrainosus: Secondary | ICD-10-CM | POA: Diagnosis not present

## 2023-12-08 DIAGNOSIS — K219 Gastro-esophageal reflux disease without esophagitis: Secondary | ICD-10-CM | POA: Diagnosis not present

## 2023-12-12 DIAGNOSIS — M1712 Unilateral primary osteoarthritis, left knee: Secondary | ICD-10-CM | POA: Diagnosis not present

## 2023-12-18 DIAGNOSIS — G6289 Other specified polyneuropathies: Secondary | ICD-10-CM | POA: Diagnosis not present

## 2023-12-18 DIAGNOSIS — M5417 Radiculopathy, lumbosacral region: Secondary | ICD-10-CM | POA: Diagnosis not present

## 2023-12-19 DIAGNOSIS — R1310 Dysphagia, unspecified: Secondary | ICD-10-CM | POA: Diagnosis not present

## 2023-12-19 DIAGNOSIS — K219 Gastro-esophageal reflux disease without esophagitis: Secondary | ICD-10-CM | POA: Diagnosis not present

## 2023-12-19 DIAGNOSIS — K2970 Gastritis, unspecified, without bleeding: Secondary | ICD-10-CM | POA: Diagnosis not present

## 2023-12-19 DIAGNOSIS — K222 Esophageal obstruction: Secondary | ICD-10-CM | POA: Diagnosis not present

## 2023-12-19 DIAGNOSIS — K317 Polyp of stomach and duodenum: Secondary | ICD-10-CM | POA: Diagnosis not present

## 2023-12-19 DIAGNOSIS — D131 Benign neoplasm of stomach: Secondary | ICD-10-CM | POA: Diagnosis not present

## 2023-12-19 DIAGNOSIS — K319 Disease of stomach and duodenum, unspecified: Secondary | ICD-10-CM | POA: Diagnosis not present

## 2023-12-19 DIAGNOSIS — K449 Diaphragmatic hernia without obstruction or gangrene: Secondary | ICD-10-CM | POA: Diagnosis not present

## 2023-12-19 DIAGNOSIS — J45909 Unspecified asthma, uncomplicated: Secondary | ICD-10-CM | POA: Diagnosis not present

## 2023-12-19 DIAGNOSIS — G4733 Obstructive sleep apnea (adult) (pediatric): Secondary | ICD-10-CM | POA: Diagnosis not present

## 2023-12-20 DIAGNOSIS — H93299 Other abnormal auditory perceptions, unspecified ear: Secondary | ICD-10-CM | POA: Diagnosis not present

## 2023-12-20 DIAGNOSIS — H903 Sensorineural hearing loss, bilateral: Secondary | ICD-10-CM | POA: Diagnosis not present

## 2023-12-31 DIAGNOSIS — J029 Acute pharyngitis, unspecified: Secondary | ICD-10-CM | POA: Diagnosis not present

## 2023-12-31 DIAGNOSIS — R0981 Nasal congestion: Secondary | ICD-10-CM | POA: Diagnosis not present

## 2023-12-31 DIAGNOSIS — H9209 Otalgia, unspecified ear: Secondary | ICD-10-CM | POA: Diagnosis not present

## 2023-12-31 DIAGNOSIS — R1319 Other dysphagia: Secondary | ICD-10-CM | POA: Diagnosis not present

## 2024-01-03 DIAGNOSIS — L539 Erythematous condition, unspecified: Secondary | ICD-10-CM | POA: Diagnosis not present

## 2024-01-10 DIAGNOSIS — H903 Sensorineural hearing loss, bilateral: Secondary | ICD-10-CM | POA: Diagnosis not present

## 2024-01-15 DIAGNOSIS — H903 Sensorineural hearing loss, bilateral: Secondary | ICD-10-CM | POA: Diagnosis not present

## 2024-01-15 DIAGNOSIS — R42 Dizziness and giddiness: Secondary | ICD-10-CM | POA: Diagnosis not present

## 2024-02-13 DIAGNOSIS — R42 Dizziness and giddiness: Secondary | ICD-10-CM | POA: Diagnosis not present

## 2024-02-13 DIAGNOSIS — G43809 Other migraine, not intractable, without status migrainosus: Secondary | ICD-10-CM | POA: Diagnosis not present

## 2024-02-13 DIAGNOSIS — H8102 Meniere's disease, left ear: Secondary | ICD-10-CM | POA: Diagnosis not present

## 2024-02-26 DIAGNOSIS — M65331 Trigger finger, right middle finger: Secondary | ICD-10-CM | POA: Diagnosis not present

## 2024-03-08 DIAGNOSIS — L209 Atopic dermatitis, unspecified: Secondary | ICD-10-CM | POA: Diagnosis not present

## 2024-03-08 DIAGNOSIS — D485 Neoplasm of uncertain behavior of skin: Secondary | ICD-10-CM | POA: Diagnosis not present

## 2024-03-10 DIAGNOSIS — J019 Acute sinusitis, unspecified: Secondary | ICD-10-CM | POA: Diagnosis not present

## 2024-03-10 DIAGNOSIS — B9689 Other specified bacterial agents as the cause of diseases classified elsewhere: Secondary | ICD-10-CM | POA: Diagnosis not present

## 2024-03-22 DIAGNOSIS — E119 Type 2 diabetes mellitus without complications: Secondary | ICD-10-CM | POA: Diagnosis not present

## 2024-03-22 DIAGNOSIS — H524 Presbyopia: Secondary | ICD-10-CM | POA: Diagnosis not present

## 2024-03-27 DIAGNOSIS — H8102 Meniere's disease, left ear: Secondary | ICD-10-CM | POA: Diagnosis not present

## 2024-04-01 DIAGNOSIS — M65331 Trigger finger, right middle finger: Secondary | ICD-10-CM | POA: Diagnosis not present

## 2024-04-01 DIAGNOSIS — M65332 Trigger finger, left middle finger: Secondary | ICD-10-CM | POA: Diagnosis not present

## 2024-04-02 DIAGNOSIS — D0461 Carcinoma in situ of skin of right upper limb, including shoulder: Secondary | ICD-10-CM | POA: Diagnosis not present

## 2024-04-03 DIAGNOSIS — M47816 Spondylosis without myelopathy or radiculopathy, lumbar region: Secondary | ICD-10-CM | POA: Diagnosis not present

## 2024-04-15 ENCOUNTER — Other Ambulatory Visit: Payer: Self-pay | Admitting: Orthopedic Surgery

## 2024-04-24 DIAGNOSIS — K219 Gastro-esophageal reflux disease without esophagitis: Secondary | ICD-10-CM | POA: Diagnosis not present

## 2024-04-24 DIAGNOSIS — G43909 Migraine, unspecified, not intractable, without status migrainosus: Secondary | ICD-10-CM | POA: Diagnosis not present

## 2024-04-24 DIAGNOSIS — E785 Hyperlipidemia, unspecified: Secondary | ICD-10-CM | POA: Diagnosis not present

## 2024-04-24 DIAGNOSIS — E1169 Type 2 diabetes mellitus with other specified complication: Secondary | ICD-10-CM | POA: Diagnosis not present

## 2024-04-24 DIAGNOSIS — R2689 Other abnormalities of gait and mobility: Secondary | ICD-10-CM | POA: Diagnosis not present

## 2024-05-08 DIAGNOSIS — J019 Acute sinusitis, unspecified: Secondary | ICD-10-CM | POA: Diagnosis not present

## 2024-05-08 DIAGNOSIS — B9689 Other specified bacterial agents as the cause of diseases classified elsewhere: Secondary | ICD-10-CM | POA: Diagnosis not present

## 2024-05-08 DIAGNOSIS — J208 Acute bronchitis due to other specified organisms: Secondary | ICD-10-CM | POA: Diagnosis not present

## 2024-05-15 ENCOUNTER — Other Ambulatory Visit: Payer: Self-pay

## 2024-05-15 ENCOUNTER — Encounter (HOSPITAL_BASED_OUTPATIENT_CLINIC_OR_DEPARTMENT_OTHER): Payer: Self-pay | Admitting: Orthopedic Surgery

## 2024-05-17 DIAGNOSIS — M2391 Unspecified internal derangement of right knee: Secondary | ICD-10-CM | POA: Diagnosis not present

## 2024-05-17 DIAGNOSIS — S8001XA Contusion of right knee, initial encounter: Secondary | ICD-10-CM | POA: Diagnosis not present

## 2024-05-17 DIAGNOSIS — W19XXXA Unspecified fall, initial encounter: Secondary | ICD-10-CM | POA: Diagnosis not present

## 2024-05-23 NOTE — Telephone Encounter (Signed)
 Patient called regarding his previous call to the clinic about the concerns he had, that he fell las Saturday and he is scheduled for surgery on 7/145/2025. Brandon Gallagher called patient yesterday and left patient a VM, patient states his phone is acting up and he must have missed it. Advice patient as per provider that it is okay to have his surgery done on 05/26/2024. Patient agreed to plan.

## 2024-05-26 ENCOUNTER — Other Ambulatory Visit: Payer: Self-pay

## 2024-05-26 ENCOUNTER — Ambulatory Visit (HOSPITAL_BASED_OUTPATIENT_CLINIC_OR_DEPARTMENT_OTHER): Admitting: Anesthesiology

## 2024-05-26 ENCOUNTER — Encounter (HOSPITAL_BASED_OUTPATIENT_CLINIC_OR_DEPARTMENT_OTHER): Payer: Self-pay | Admitting: Orthopedic Surgery

## 2024-05-26 ENCOUNTER — Encounter (HOSPITAL_BASED_OUTPATIENT_CLINIC_OR_DEPARTMENT_OTHER)
Admission: RE | Disposition: A | Discharge status: Home / Self Care | Payer: Self-pay | Source: Home / Self Care | Attending: Orthopedic Surgery

## 2024-05-26 ENCOUNTER — Ambulatory Visit (HOSPITAL_BASED_OUTPATIENT_CLINIC_OR_DEPARTMENT_OTHER)
Admission: RE | Admit: 2024-05-26 | Discharge: 2024-05-26 | Disposition: A | Discharge status: Home / Self Care | Attending: Orthopedic Surgery | Admitting: Orthopedic Surgery

## 2024-05-26 DIAGNOSIS — K219 Gastro-esophageal reflux disease without esophagitis: Secondary | ICD-10-CM | POA: Diagnosis not present

## 2024-05-26 DIAGNOSIS — Z87891 Personal history of nicotine dependence: Secondary | ICD-10-CM

## 2024-05-26 DIAGNOSIS — Z7984 Long term (current) use of oral hypoglycemic drugs: Secondary | ICD-10-CM | POA: Diagnosis not present

## 2024-05-26 DIAGNOSIS — J4489 Other specified chronic obstructive pulmonary disease: Secondary | ICD-10-CM | POA: Diagnosis not present

## 2024-05-26 DIAGNOSIS — E119 Type 2 diabetes mellitus without complications: Secondary | ICD-10-CM | POA: Insufficient documentation

## 2024-05-26 DIAGNOSIS — Z01818 Encounter for other preprocedural examination: Secondary | ICD-10-CM

## 2024-05-26 DIAGNOSIS — G4733 Obstructive sleep apnea (adult) (pediatric): Secondary | ICD-10-CM | POA: Diagnosis not present

## 2024-05-26 DIAGNOSIS — M65331 Trigger finger, right middle finger: Secondary | ICD-10-CM | POA: Insufficient documentation

## 2024-05-26 DIAGNOSIS — F32A Depression, unspecified: Secondary | ICD-10-CM | POA: Insufficient documentation

## 2024-05-26 DIAGNOSIS — J449 Chronic obstructive pulmonary disease, unspecified: Secondary | ICD-10-CM | POA: Diagnosis not present

## 2024-05-26 DIAGNOSIS — F419 Anxiety disorder, unspecified: Secondary | ICD-10-CM | POA: Insufficient documentation

## 2024-05-26 DIAGNOSIS — F418 Other specified anxiety disorders: Secondary | ICD-10-CM | POA: Diagnosis not present

## 2024-05-26 HISTORY — PX: TRIGGER FINGER RELEASE: SHX641

## 2024-05-26 LAB — BASIC METABOLIC PANEL WITH GFR
Anion gap: 11 (ref 5–15)
BUN: 11 mg/dL (ref 8–23)
CO2: 26 mmol/L (ref 22–32)
Calcium: 9.4 mg/dL (ref 8.9–10.3)
Chloride: 102 mmol/L (ref 98–111)
Creatinine, Ser: 0.96 mg/dL (ref 0.61–1.24)
GFR, Estimated: >60 mL/min (ref >60)
Glucose, Bld: 101 mg/dL — ABNORMAL HIGH (ref 70–99)
Potassium: 4.5 mmol/L (ref 3.5–5.1)
Sodium: 139 mmol/L (ref 135–145)

## 2024-05-26 LAB — GLUCOSE, CAPILLARY
Glucose-Capillary: 104 mg/dL — ABNORMAL HIGH (ref 70–99)
Glucose-Capillary: 105 mg/dL — ABNORMAL HIGH (ref 70–99)

## 2024-05-26 SURGERY — RELEASE, A1 PULLEY, FOR TRIGGER FINGER
Anesthesia: General | Site: Middle Finger | Laterality: Right

## 2024-05-26 MED ORDER — BUPIVACAINE HCL (PF) 0.25 % IJ SOLN
INTRAMUSCULAR | Status: DC | PRN
Start: 2024-05-26 — End: 2024-05-26
  Administered 2024-05-26: 9 mL

## 2024-05-26 MED ORDER — ONDANSETRON HCL 4 MG/2ML IJ SOLN: 4.0000 mg | Freq: Once | INTRAMUSCULAR | Status: DC | PRN

## 2024-05-26 MED ORDER — LIDOCAINE 2% (20 MG/ML) 5 ML SYRINGE
INTRAMUSCULAR | Status: DC | PRN
  Administered 2024-05-26: 100 mg via INTRAVENOUS

## 2024-05-26 MED ORDER — ONDANSETRON HCL 4 MG/2ML IJ SOLN
INTRAMUSCULAR | Status: DC | PRN
Start: 2024-05-26 — End: 2024-05-26
  Administered 2024-05-26: 4 mg via INTRAVENOUS

## 2024-05-26 MED ORDER — DEXAMETHASONE SODIUM PHOSPHATE 10 MG/ML IJ SOLN
INTRAMUSCULAR | Status: AC
  Filled 2024-05-26: qty 1

## 2024-05-26 MED ORDER — MIDAZOLAM HCL 2 MG/2ML IJ SOLN
INTRAMUSCULAR | Status: AC
  Filled 2024-05-26: qty 2

## 2024-05-26 MED ORDER — PROPOFOL 10 MG/ML IV BOLUS
INTRAVENOUS | Status: DC | PRN
  Administered 2024-05-26: 150 mg via INTRAVENOUS

## 2024-05-26 MED ORDER — FENTANYL CITRATE (PF) 100 MCG/2ML IJ SOLN
INTRAMUSCULAR | Status: AC
  Filled 2024-05-26: qty 2

## 2024-05-26 MED ORDER — PROPOFOL 10 MG/ML IV BOLUS
INTRAVENOUS | Status: AC
Start: 2024-05-26 — End: 2024-05-26
  Filled 2024-05-26: qty 20

## 2024-05-26 MED ORDER — FENTANYL CITRATE (PF) 100 MCG/2ML IJ SOLN: 25.0000 ug | INTRAMUSCULAR | Status: DC | PRN

## 2024-05-26 MED ORDER — OXYCODONE HCL 5 MG/5ML PO SOLN: 5.0000 mg | Freq: Once | ORAL | Status: DC | PRN

## 2024-05-26 MED ORDER — 0.9 % SODIUM CHLORIDE (POUR BTL) OPTIME
TOPICAL | Status: DC | PRN
Start: 2024-05-26 — End: 2024-05-26
  Administered 2024-05-26: 1000 mL

## 2024-05-26 MED ORDER — LIDOCAINE 2% (20 MG/ML) 5 ML SYRINGE
INTRAMUSCULAR | Status: AC
  Filled 2024-05-26: qty 5

## 2024-05-26 MED ORDER — LACTATED RINGERS IV SOLN: INTRAVENOUS | Status: DC | PRN

## 2024-05-26 MED ORDER — FENTANYL CITRATE (PF) 100 MCG/2ML IJ SOLN
INTRAMUSCULAR | Status: DC | PRN
  Administered 2024-05-26: 50 ug via INTRAVENOUS

## 2024-05-26 MED ORDER — ACETAMINOPHEN 10 MG/ML IV SOLN: 1000.0000 mg | Freq: Once | INTRAVENOUS | Status: DC | PRN

## 2024-05-26 MED ORDER — HYDROCODONE-ACETAMINOPHEN 5-325 MG PO TABS: 1.0000 | ORAL_TABLET | Freq: Four times a day (QID) | ORAL | 0 refills | Status: DC | PRN

## 2024-05-26 MED ORDER — ONDANSETRON HCL 4 MG/2ML IJ SOLN
INTRAMUSCULAR | Status: AC
Start: 2024-05-26 — End: 2024-05-26
  Filled 2024-05-26: qty 2

## 2024-05-26 MED ORDER — OXYCODONE HCL 5 MG PO TABS: 5.0000 mg | ORAL_TABLET | Freq: Once | ORAL | Status: DC | PRN

## 2024-05-26 SURGICAL SUPPLY — 26 items
BLADE SURG 15 STRL LF DISP TIS (BLADE) ×4 IMPLANT
BNDG COHESIVE 2X5 TAN ST LF (GAUZE/BANDAGES/DRESSINGS) ×2 IMPLANT
BNDG COMPR ESMARK 4X3 LF (GAUZE/BANDAGES/DRESSINGS) IMPLANT
CHLORAPREP W/TINT 26 (MISCELLANEOUS) ×2 IMPLANT
CORD BIPOLAR FORCEPS 12FT (ELECTRODE) ×2 IMPLANT
COVER BACK TABLE 60X90IN (DRAPES) ×2 IMPLANT
COVER MAYO STAND STRL (DRAPES) ×2 IMPLANT
CUFF TOURN SGL QUICK 18X4 (TOURNIQUET CUFF) ×2 IMPLANT
DRAPE EXTREMITY T 121X128X90 (DISPOSABLE) ×2 IMPLANT
DRAPE SURG 17X23 STRL (DRAPES) ×2 IMPLANT
GAUZE SPONGE 4X4 12PLY STRL (GAUZE/BANDAGES/DRESSINGS) ×2 IMPLANT
GAUZE XEROFORM 1X8 LF (GAUZE/BANDAGES/DRESSINGS) ×2 IMPLANT
GLOVE BIO SURGEON STRL SZ7.5 (GLOVE) ×2 IMPLANT
GLOVE BIOGEL PI IND STRL 8 (GLOVE) ×2 IMPLANT
GOWN STRL REUS W/ TWL LRG LVL3 (GOWN DISPOSABLE) ×2 IMPLANT
GOWN STRL REUS W/TWL XL LVL3 (GOWN DISPOSABLE) ×2 IMPLANT
NDL HYPO 25X1 1.5 SAFETY (NEEDLE) ×2 IMPLANT
NEEDLE HYPO 25X1 1.5 SAFETY (NEEDLE) ×1 IMPLANT
NS IRRIG 1000ML POUR BTL (IV SOLUTION) ×2 IMPLANT
PACK BASIN DAY SURGERY FS (CUSTOM PROCEDURE TRAY) ×2 IMPLANT
STOCKINETTE 4X48 STRL (DRAPES) ×2 IMPLANT
SUT ETHILON 4 0 PS 2 18 (SUTURE) ×2 IMPLANT
SYR BULB EAR ULCER 3OZ GRN STR (SYRINGE) ×2 IMPLANT
SYR CONTROL 10ML LL (SYRINGE) ×2 IMPLANT
TOWEL GREEN STERILE FF (TOWEL DISPOSABLE) ×4 IMPLANT
UNDERPAD 30X36 HEAVY ABSORB (UNDERPADS AND DIAPERS) ×2 IMPLANT

## 2024-05-26 NOTE — Anesthesia Preprocedure Evaluation (Signed)
 Anesthesia Evaluation  Patient identified by MRN, date of birth, ID band Patient awake    Reviewed: Allergy  & Precautions, NPO status , Patient's Chart, lab work & pertinent test results, reviewed documented beta blocker date and time   History of Anesthesia Complications (+) Family history of anesthesia reaction  Airway Mallampati: II  TM Distance: >3 FB     Dental no notable dental hx.    Pulmonary asthma , sleep apnea , COPD,  COPD inhaler, former smoker   breath sounds clear to auscultation       Cardiovascular (-) angina (-) CAD, (-) Past MI and (-) Cardiac Stents  Rhythm:Regular Rate:Normal     Neuro/Psych  Headaches, neg Seizures PSYCHIATRIC DISORDERS Anxiety Depression     Neuromuscular disease    GI/Hepatic ,GERD  Medicated and Controlled,,  Endo/Other  diabetes, Type 2    Renal/GU      Musculoskeletal  (+) Arthritis , Osteoarthritis,    Abdominal   Peds  Hematology   Anesthesia Other Findings   Reproductive/Obstetrics                              Anesthesia Physical Anesthesia Plan  ASA: 3  Anesthesia Plan: General   Post-op Pain Management:    Induction:   PONV Risk Score and Plan: 1 and Ondansetron  and Dexamethasone   Airway Management Planned: LMA  Additional Equipment:   Intra-op Plan:   Post-operative Plan: Extubation in OR  Informed Consent: I have reviewed the patients History and Physical, chart, labs and discussed the procedure including the risks, benefits and alternatives for the proposed anesthesia with the patient or authorized representative who has indicated his/her understanding and acceptance.     Dental advisory given  Plan Discussed with: CRNA  Anesthesia Plan Comments:          Anesthesia Quick Evaluation

## 2024-05-26 NOTE — Op Note (Signed)
 05/26/2024 [facility]  Operative Note  PREOPERATIVE DIAGNOSIS: RIGHT MIDDLE FINGER TRIGGER DIGIT  POSTOPERATIVE DIAGNOSIS:  RIGHT MIDDLE FINGER TRIGGER DIGIT  PROCEDURE: Procedure(s): RELEASE, A1 PULLEY, FOR TRIGGER FINGER   SURGEON:  Franky Curia, MD  ASSISTANT:  none.  ANESTHESIA:  General.  IV FLUIDS:  Per anesthesia flow sheet.  ESTIMATED BLOOD LOSS:  Minimal.  COMPLICATIONS:  None.  SPECIMENS:  None.  TOURNIQUET TIME:  Total Tourniquet Time Documented: Upper Arm (Right) - 7 minutes Total: Upper Arm (Right) - 7 minutes   DISPOSITION:  Stable to PACU.  LOCATION: [facility]  INDICATIONS: Brandon Gallagher is a 63 y.o. male with triggering of the long finger.  This has been injected without lasting resolution.  He wishes to proceed with surgical trigger release.  Risks, benefits and alternatives of surgery were discussed including the risk of blood loss, infection, damage to nerves, vessels, tendons, ligaments, bone, failure of surgery, need for additional surgery, complications with wound healing, continued pain, continued triggering and need for repeat surgery.  He voiced understanding of these risks and elected to proceed.  OPERATIVE COURSE:  After being identified preoperatively by myself, the patient and I agreed upon the procedure and site of procedure.  The surgical site was marked. Surgical consent had been signed. He was given IV Ancef  as preoperative antibiotic prophylaxis. He was transported to the operating room and placed on the operating room table in supine position with the Right upper extremity on an arm board. General anesthesia was induced by the anesthesiologist.  The Right upper extremity was prepped and draped in normal sterile orthopedic fashion. A surgical pause was performed between surgeons, anesthesia, and operating room staff, and all were in agreement as to the patient, procedure, and site of procedure.  Tourniquet at the  proximal aspect of the extremity was inflated to 250 mmHg after exsanguination of the arm with an Esmarch bandage.  An incision was made at the volar aspect of the MP joint of the long finger.  This was carried into the subcutaneous tissues by spreading technique.  Bipolar electrocautery was used to obtain hemostasis.  The radial and ulnar digital nerves were protected throughout the case. The flexor sheath was identified.  The A1 pulley was identified and sharply incised.  It was released in its entirety.  The proximal 1-2 mm of the A2 pulley was vented to allow better excursion of the tendons.  The finger was placed through a range of motion and there was noted to be no catching.  The tendons were brought through the wound and any adherences released.  The wound was then copiously irrigated with sterile saline. It was closed with 4-0 nylon in a horizontal mattress fashion.  It was injected with 0.25% plain Marcaine  to aid in postoperative analgesia.  It was dressed with sterile Xeroform, 4x4s, and wrapped lightly with a Coban dressing.  Tourniquet was deflated at 7 minutes.  The fingertips were pink with brisk capillary refill after deflation of the tourniquet.  The operative drapes were broken down and the patient was awoken from anesthesia safely.  He was transferred back to the stretcher and taken to the PACU in stable condition.   I will see him back in the office in 1 week for postoperative followup.  I will give him a prescription for Norco 5/325 1 tab PO q6 hours prn pain, dispense #15.    Shalana Jardin, MD Electronically signed, 05/26/24

## 2024-05-26 NOTE — Discharge Instructions (Addendum)

## 2024-05-26 NOTE — H&P (Signed)
 Brandon Gallagher is an 63 y.o. male.   Chief Complaint: trigger digit HPI: 63 yo male with right long finger trigger digit.  This has been injected without lasting relief.  He wishes to proceed with surgical release.  Allergies: No Known Allergies  Past Medical History:  Diagnosis Date   Acute respiratory disease due to COVID-19 virus 10/30/2019   Acute respiratory failure with hypoxia (HCC) 10/30/2019   ADHD (attention deficit hyperactivity disorder)    diagnosed as a teenager   Allergy     Anxiety    Arthritis    Asthma    Blood transfusion without reported diagnosis    Bronchitis    last had back in 2017   Sees no pulmonary md   Cervical spondylosis with radiculopathy 01/01/2017   Chronic bilateral low back pain without sciatica 01/10/2016   COPD (chronic obstructive pulmonary disease) (HCC)    Depression    Diabetes mellitus without complication (HCC) 2022   Diverticulitis of sigmoid colon 02/05/2020   Diverticulosis    last flare up was in Jan. 2018  Was on course of antibiotics for this   Esophageal stricture    Esophagus disorder    narrowing of esophagus.  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 Shiela   Overview:  S/P minimally invasive lumbar laminectomy L3-4 performed on 10/08/2015 by Dr. Shiela    Sacroiliac joint pain 07/27/2016   Sinus  bradycardia by electrocardiogram 10/30/2019   Sleep apnea    no CPAP- unsure if true diagnosis or not   Thrombosed external hemorrhoid     Past Surgical History:  Procedure Laterality Date   ANTERIOR CERVICAL DECOMP/DISCECTOMY FUSION N/A 01/01/2017   Procedure: Cervical four- five and Cervical six- seven Anterior cervical discectomy with fusion and plate fixation;  Surgeon: Morene Hicks Ditty, MD;  Location: [facility] OR;  Service: Neurosurgery;  Laterality: N/A;  C4-5 and C6-7 Anterior cervical discectomy with fusion and plate fixation   BACK SURGERY     x 2    @ [facility]        Last one 09/2015   BACK SURGERY  2024   three surgeries   CARDIAC CATHETERIZATION     He thinks.   Not really sure where at, thought it was here @ [facility], but its been a LONG time ?2000   CARPAL TUNNEL RELEASE Bilateral    COLECTOMY     COLONOSCOPY  10/22/2017   Colonic polyp status post polypectomy. Moderate predominantly sigmoid diverticulosis.    COLONOSCOPY  2019   Dr Anette   ESOPHAGOGASTRODUODENOSCOPY  06/08/2016   Distal esophageal stricture status post esophageal dilatation. Presbtesophagus, also suggestive of motility disorder. Mild gastritis.    evacuation thrombosed external hemorrhoid  2021   HERNIA REPAIR  2023   KNEE SURGERY  Right    LUMBAR LAMINECTOMY  10/08/2015   L3-4, Surgeon Alm Fairy Pack, MD   NASAL SINUS SURGERY     HAS HAD 2 OR 3.   LAST ONE 2011, IN [place]  (LEFT NASAL PASSAGE IS NARROWER THAN RIGHT   OTHER SURGICAL HISTORY  2023   Surgery for diverticulitis   SACROILIAC JOINT INJECTION Bilateral 01/10/2016   TRIGGER FINGER RELEASE Left 2024   UPPER GASTROINTESTINAL ENDOSCOPY      Family History: Family History  Problem Relation Age of Onset   Heart disease Mother    Stroke Father    Colon cancer Neg Hx    Esophageal cancer Neg Hx    Stomach cancer Neg Hx    Diabetes Neg Hx    Rectal cancer Neg Hx     Social History:   reports that he quit smoking about 24 years  ago. His smoking use included cigarettes. He started smoking about 54 years ago. He has a 30 pack-year smoking history. He has never used smokeless tobacco. He reports that he does not currently use drugs. He reports that he does not drink alcohol.  Medications: Medications Prior to Admission  Medication Sig Dispense Refill   acetaminophen  (TYLENOL ) 500 MG tablet Take 500-1,000 mg by mouth every 6 (six) hours as needed for moderate pain.     Albuterol -Budesonide  (AIRSUPRA ) 90-80 MCG/ACT AERO Inhale 2 puffs into the lungs every 4 (four) hours. 10.7 g 2   ALPRAZolam  (XANAX ) 1 MG tablet Take 1 mg by mouth 2 (two) times daily.     budesonide  (PULMICORT ) 1 MG/2ML nebulizer solution Mix one ampule into 200-250 mL of distilled water/saline and irrigate nasal passages twice daily as directed. 240 mL 5   buPROPion (WELLBUTRIN XL) 300 MG 24 hr tablet Take 300 mg by mouth daily.     divalproex (DEPAKOTE) 500 MG DR tablet Take 500 mg by mouth 2 (two) times daily.     famotidine  (PEPCID ) 40 MG tablet Take one tablet by mouth one to two times daily as directed. 60 tablet 5   FARXIGA 10 MG TABS tablet Take 10 mg by mouth daily.     gabapentin  (NEURONTIN ) 300 MG capsule Take 1 capsule (300 mg total) by mouth at bedtime. 30 capsule 3   Ibuprofen-diphenhydrAMINE  Cit (IBUPROFEN PM PO) Take by mouth as needed.     levofloxacin (LEVAQUIN) 750 MG tablet Take 750 mg by mouth daily.     meclizine (ANTIVERT) 25 MG tablet Take 25 mg by mouth 3 (three) times daily as needed.     metFORMIN (GLUCOPHAGE) 500 MG tablet Take 500 mg by mouth 2 (two) times daily.     metoCLOPramide  (REGLAN ) 5 MG tablet Take 1 tablet by mouth at bedtime 30 tablet 5   montelukast  (SINGULAIR ) 10 MG tablet Take by mouth.     omeprazole  (PRILOSEC) 40 MG capsule Take 1 tablet 2 times per day 60 capsule 0   rosuvastatin (CRESTOR) 5 MG tablet SMARTSIG:1 Tablet(s) By Mouth Every Evening     tamsulosin  (FLOMAX ) 0.4 MG CAPS capsule Take 0.4 mg by mouth in  the morning and at bedtime.     venlafaxine  XR (EFFEXOR -XR) 150 MG 24 hr capsule Take 150 mg by mouth daily.     ACCU-CHEK GUIDE test strip 1 (one) Each in vitro daily     Blood Glucose Monitoring Suppl (ACCU-CHEK GUIDE ME) w/Device KIT 1 (one) Kit daily     EPINEPHrine  0.3 mg/0.3 mL IJ SOAJ injection SMARTSIG:0.3 Milliliter(s) IM  PRN     fluconazole  (DIFLUCAN ) 150 MG tablet Take 150 mg by mouth daily.     GERI-DRYL 25 MG tablet SMARTSIG:2 Tablet(s) By Mouth     HIZENTRA 10 GM/50ML SOLN INFUSE 20g SUBCUTANEOUSLY EVERY OTHER WEEK 200 mL 11   nystatin (MYCOSTATIN) 100000 UNIT/ML suspension Take 10 mLs by mouth 4 (four) times daily.     nystatin (MYCOSTATIN/NYSTOP) powder Apply topically 3 (three) times daily.     Ubrogepant (UBRELVY) 100 MG TABS Take by mouth.     zolpidem  (AMBIEN  CR) 12.5 MG CR tablet SMARTSIG:1 Tablet(s) By Mouth Every Evening      Results for orders placed or performed during the hospital encounter of 05/26/24 (from the past 48 hours)  Basic metabolic panel per protocol     Status: Abnormal   Collection Time: 05/26/24 11:05 AM  Result Value Ref Range   Sodium 139 135 - 145 mmol/L   Potassium 4.5 3.5 - 5.1 mmol/L   Chloride 102 98 - 111 mmol/L   CO2 26 22 - 32 mmol/L   Glucose, Bld 101 (H) 70 - 99 mg/dL    Comment: Glucose reference range applies only to samples taken after fasting for at least 8 hours.   BUN 11 8 - 23 mg/dL   Creatinine, Ser 9.03 0.61 - 1.24 mg/dL   Calcium  9.4 8.9 - 10.3 mg/dL   GFR, Estimated >39 >39 mL/min    Comment: (NOTE) Calculated using the CKD-EPI Creatinine Equation (2021)    Anion gap 11 5 - 15    Comment: Performed at [facility], [address] [place], [place] [address]  Glucose, capillary     Status: Abnormal   Collection Time: 05/26/24 11:36 AM  Result Value Ref Range   Glucose-Capillary 104 (H) 70 - 99 mg/dL    Comment: Glucose reference range applies only to samples taken after fasting for at least 8 hours.   Comment 1  Notify RN    Comment 2 Document in Chart     No results found.    Blood pressure 113/81, pulse (!) 102, temperature 97.9 F (36.6 C), temperature source Temporal, height 5' 11 (1.803 m), weight 115 kg, SpO2 100%.  General appearance: alert, cooperative, and appears stated age Head: Normocephalic, without obvious abnormality, atraumatic Neck: supple, symmetrical, trachea midline Extremities: Intact sensation and capillary refill all digits.  +epl/fpl/io.  No wounds.  Skin: Skin color, texture, turgor normal. No rashes or lesions Neurologic: Grossly normal Incision/Wound: none  Assessment/Plan Right long finger trigger digit.  Non operative and operative treatment options have been discussed with the patient and patient wishes to proceed with operative treatment. Risks, benefits and alternatives of surgery were discussed including risks of blood loss, infection, damage to nerves/vessels/tendons/ligament/bone, failure of surgery, need for additional surgery, complication with wound healing, stiffness, recurrence.  He voiced understanding of these risks and elected to proceed.    Yohannes Waibel 05/26/2024, 1:10 PM

## 2024-05-26 NOTE — Anesthesia Procedure Notes (Signed)
 Procedure Name: LMA Insertion Date/Time: 05/26/2024 2:30 PM  Performed by: Donnell Berwyn SQUIBB, CRNAPre-anesthesia Checklist: Patient identified, Emergency Drugs available, Suction available, Patient being monitored and Timeout performed Patient Re-evaluated:Patient Re-evaluated prior to induction Oxygen  Delivery Method: Nasal cannula and Circle system utilized Preoxygenation: Pre-oxygenation with 100% oxygen  Induction Type: IV induction Ventilation: Mask ventilation without difficulty LMA: LMA inserted LMA Size: 5.0 Placement Confirmation: positive ETCO2 and breath sounds checked- equal and bilateral Tube secured with: Tape Dental Injury: Teeth and Oropharynx as per pre-operative assessment

## 2024-05-26 NOTE — Transfer of Care (Signed)
 Immediate Anesthesia Transfer of Care Note  Patient: Brandon Gallagher  Procedure(s) Performed: RELEASE, A1 PULLEY, FOR TRIGGER FINGER (Right: Middle Finger)  Patient Location: PACU  Anesthesia Type:General  Level of Consciousness: awake, alert , oriented, and patient cooperative  Airway & Oxygen  Therapy: Patient Spontanous Breathing and Patient connected to face mask oxygen   Post-op Assessment: Report given to RN and Post -op Vital signs reviewed and stable  Post vital signs: Reviewed and stable  Last Vitals:  Vitals Value Taken Time  BP    Temp    Pulse    Resp 13 05/26/24 15:00  SpO2    Vitals shown include unfiled device data.  Last Pain:  Vitals:   05/26/24 1137  TempSrc: Temporal  PainSc: 0-No pain      Patients Stated Pain Goal: 3 (05/26/24 1137)  Complications: No notable events documented.

## 2024-05-27 ENCOUNTER — Encounter (HOSPITAL_BASED_OUTPATIENT_CLINIC_OR_DEPARTMENT_OTHER): Payer: Self-pay | Admitting: Orthopedic Surgery

## 2024-05-27 DIAGNOSIS — M25561 Pain in right knee: Secondary | ICD-10-CM | POA: Diagnosis not present

## 2024-05-27 NOTE — Anesthesia Postprocedure Evaluation (Signed)
 Anesthesia Post Note  Patient: Brandon Gallagher  Procedure(s) Performed: RELEASE, A1 PULLEY, FOR TRIGGER FINGER (Right: Middle Finger)     Patient location during evaluation: PACU Anesthesia Type: General Level of consciousness: awake and alert Pain management: pain level controlled Vital Signs Assessment: post-procedure vital signs reviewed and stable Respiratory status: spontaneous breathing, nonlabored ventilation, respiratory function stable and patient connected to nasal cannula oxygen  Cardiovascular status: blood pressure returned to baseline and stable Postop Assessment: no apparent nausea or vomiting Anesthetic complications: no   No notable events documented.  Last Vitals:  Vitals:   05/26/24 1515 05/26/24 1535  BP: (!) 143/82 122/72  Pulse: 84 80  Resp: 14 18  Temp:  36.7 C  SpO2: 97% 98%    Last Pain:  Vitals:   05/26/24 1535  TempSrc:   PainSc: 0-No pain                 Lynwood MARLA Cornea

## 2024-05-28 DIAGNOSIS — M25561 Pain in right knee: Secondary | ICD-10-CM | POA: Diagnosis not present

## 2024-06-02 DIAGNOSIS — M1711 Unilateral primary osteoarthritis, right knee: Secondary | ICD-10-CM | POA: Diagnosis not present

## 2024-06-02 DIAGNOSIS — M65331 Trigger finger, right middle finger: Secondary | ICD-10-CM | POA: Diagnosis not present

## 2024-06-09 DIAGNOSIS — M65331 Trigger finger, right middle finger: Secondary | ICD-10-CM | POA: Diagnosis not present

## 2024-06-10 DIAGNOSIS — M25561 Pain in right knee: Secondary | ICD-10-CM | POA: Diagnosis not present

## 2024-06-10 DIAGNOSIS — M94261 Chondromalacia, right knee: Secondary | ICD-10-CM | POA: Diagnosis not present

## 2024-06-16 ENCOUNTER — Telehealth: Payer: Self-pay | Admitting: Allergy and Immunology

## 2024-06-16 NOTE — Telephone Encounter (Signed)
 Patient states he's been having non stop sinus issues, sore throat and bronchitis. He's not sure if the Hizentra is helping his immune deficiency. He wants to know if Dr. Kozlow has any suggestions. I did inform him he is due for a visit and scheduled for 8/21, he was not able to come in sooner due to transportation.

## 2024-06-19 NOTE — Telephone Encounter (Signed)
 Faxed a request to Dr Keren office

## 2024-06-26 NOTE — Telephone Encounter (Signed)
 MRI and chest xray placed on Dr rowan desk

## 2024-07-03 ENCOUNTER — Encounter: Payer: Self-pay | Admitting: Allergy and Immunology

## 2024-07-03 ENCOUNTER — Ambulatory Visit (INDEPENDENT_AMBULATORY_CARE_PROVIDER_SITE_OTHER): Admitting: Allergy and Immunology

## 2024-07-03 VITALS — BP 110/76 | HR 112 | Resp 16 | Ht 69.5 in | Wt 250.0 lb

## 2024-07-03 DIAGNOSIS — K219 Gastro-esophageal reflux disease without esophagitis: Secondary | ICD-10-CM | POA: Diagnosis not present

## 2024-07-03 DIAGNOSIS — J3489 Other specified disorders of nose and nasal sinuses: Secondary | ICD-10-CM | POA: Diagnosis not present

## 2024-07-03 DIAGNOSIS — J4540 Moderate persistent asthma, uncomplicated: Secondary | ICD-10-CM | POA: Diagnosis not present

## 2024-07-03 DIAGNOSIS — D839 Common variable immunodeficiency, unspecified: Secondary | ICD-10-CM

## 2024-07-03 MED ORDER — XHANCE 93 MCG/ACT NA EXHU: INHALANT_SUSPENSION | NASAL | 5 refills | Status: AC

## 2024-07-03 MED ORDER — AZITHROMYCIN 250 MG PO TABS
ORAL_TABLET | ORAL | 4 refills | Status: DC
Start: 2024-07-03 — End: 2024-07-31

## 2024-07-03 NOTE — Patient Instructions (Addendum)
  1.  Continue immunoglobulin infusions   2.  Start azithromycin  250 - 1 tablet 3 times per week  3.  Start Xhance  - 2 sprays each nostril 2 times per day  4.  If needed:   A.  Nasal saline  B.  AirSupra  -2 inhalations every 4-6 hours   C.  Systane eye drops  5.  Continue to treat reflux with the following:   A.  Omeprazole  40 mg - 1 tablet 1-2 times per day   B.  Famotidine  40 mg - 1 tablet 2 times per day  C.  Magnesium based antacids  6. Return to clinic in December 2025 or earlier if problem  7. Influenza = tamiflu. Covid = paxlovid  8. Obtain fall flu vaccine

## 2024-07-03 NOTE — Progress Notes (Signed)
 [place] [facility] [place] - [place] - [place] - [place]   Follow-up Note  Referring Provider: Keren Vicenta BRAVO, MD Primary Provider: Keren Vicenta BRAVO, MD Date of Office Visit: 07/03/2024  Subjective:   Brandon Gallagher (DOB: 1961/09/27) is a 63 y.o. 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  I last saw him in this clinic 05 July 2023.  Brandon Gallagher informs me that he has been having sinusitis.  Sinusitis for Brandon Gallagher is a headache and fullness in his head and stuffiness that may be some ugly nasal discharge.  He states that he has been receiving recurrent antibiotics for these episodes over the course of the past year.  Difficult to say how often he has received these agents but it sounds as though he gets them every month.  This occurs even though he has been using his immunoglobulin infusions for his memory B-cell lymphopenia and hypogammaglobulinemia.  He has not had any problems with asthma and he rarely uses a short acting bronchodilator.  He is not using any nasal steroid at this point in time.  His reflux and GI issues actually appears to be under pretty good control at this point in time on his proton pump inhibitor and H2 receptor blocker.  He has been visiting with neurology at [facility] and apparently is being evaluated for headaches and dizziness and recurrent syncope.  It does not sound as though the plan that has been established by the neurologist is really helping him with these issues and I encouraged him to touch base with the neurologist about a different plan.  Allergies as of 07/03/2024   No Known Allergies      Medication List    Accu-Chek Guide Me w/Device Kit 1 (one) Kit daily   Accu-Chek Guide test strip Generic drug: glucose blood 1 (one) Each in vitro daily   Airsupra  90-80  MCG/ACT Aero Generic drug: Albuterol -Budesonide  Inhale 2 puffs into the lungs every 4 (four) hours.   azithromycin  250 MG tablet Commonly known as: Zithromax  Take one tablet three times a week. Started by: Kennita Pavlovich J Boston Catarino   budesonide  1 MG/2ML nebulizer solution Commonly known as: PULMICORT  Mix one ampule into 200-250 mL of distilled water/saline and irrigate nasal passages twice daily as directed.   buPROPion 300 MG 24 hr tablet Commonly known as: WELLBUTRIN XL Take 300 mg by mouth daily.   CORICIDIN HBP PO Take by mouth as needed.   divalproex 500 MG DR tablet Commonly known as: DEPAKOTE Take 500 mg by mouth 2 (two) times daily.   EPINEPHrine  0.3 mg/0.3 mL Soaj injection Commonly known as: EPI-PEN SMARTSIG:0.3 Milliliter(s) IM PRN   famotidine  40 MG tablet Commonly known as: PEPCID  Take one tablet by mouth one to two times daily as directed.   Farxiga 10 MG Tabs tablet Generic drug: dapagliflozin propanediol Take 10 mg by mouth daily.   Geri-Dryl 25 MG tablet Generic drug: diphenhydrAMINE  SMARTSIG:2 Tablet(s) By Mouth   Hizentra 10 GM/50ML Sosy Generic drug: Immune Globulin (Human) INFUSE 20G SUBCUTANEOUSLY EVERY OTHER WEEK   meclizine 25 MG tablet Commonly known as: ANTIVERT Take 25 mg by mouth 3 (three) times daily as needed.   metFORMIN 500 MG tablet Commonly known as: GLUCOPHAGE Take 500 mg by mouth 2 (two) times daily.   omeprazole  40 MG capsule Commonly known as: PRILOSEC Take 1 tablet  2 times per day   rosuvastatin 5 MG tablet Commonly known as: CRESTOR SMARTSIG:1 Tablet(s) By Mouth Every Evening   tamsulosin  0.4 MG Caps capsule Commonly known as: FLOMAX  Take 0.4 mg by mouth in the morning and at bedtime.   topiramate 25 MG tablet Commonly known as: TOPAMAX Take 25 mg by mouth 2 (two) times daily.   venlafaxine  XR 150 MG 24 hr capsule Commonly known as: EFFEXOR -XR Take 150 mg by mouth daily.    Past Medical History:  Diagnosis Date    Acute respiratory disease due to COVID-19 virus 10/30/2019   Acute respiratory failure with hypoxia (HCC) 10/30/2019   ADHD (attention deficit hyperactivity disorder)    diagnosed as a teenager   Allergy     Anxiety    Arthritis    Asthma    Blood transfusion without reported diagnosis    Bronchitis    last had back in 2017   Sees no pulmonary md   Cervical spondylosis with radiculopathy 01/01/2017   Chronic bilateral low back pain without sciatica 01/10/2016   COPD (chronic obstructive pulmonary disease) (HCC)    Depression    Diabetes mellitus without complication (HCC) 2022   Diverticulitis of sigmoid colon 02/05/2020   Diverticulosis    last flare up was in Jan. 2018  Was on course of antibiotics for this   Esophageal stricture    Esophagus disorder    narrowing of esophagus.  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 Shiela   Overview:  S/P minimally invasive lumbar laminectomy L3-4 performed on 10/08/2015 by Dr. Shiela    Sacroiliac joint pain 07/27/2016   Sinus bradycardia by electrocardiogram 10/30/2019   Sleep apnea    no CPAP- unsure if true diagnosis or not   Thrombosed external hemorrhoid     Past Surgical History:  Procedure Laterality Date   ANTERIOR CERVICAL DECOMP/DISCECTOMY FUSION N/A 01/01/2017   Procedure: Cervical four- five and  Cervical six- seven Anterior cervical discectomy with fusion and plate fixation;  Surgeon: Morene Hicks Ditty, MD;  Location: [facility] OR;  Service: Neurosurgery;  Laterality: N/A;  C4-5 and C6-7 Anterior cervical discectomy with fusion and plate fixation   BACK SURGERY     x 2    @ [facility]        Last one 09/2015   BACK SURGERY  2024   three surgeries   CARDIAC CATHETERIZATION     He thinks.   Not really sure where at, thought it was here @ [facility], but its been a LONG time ?2000   CARPAL TUNNEL RELEASE Bilateral    COLECTOMY     COLONOSCOPY  10/22/2017   Colonic polyp status post polypectomy. Moderate predominantly sigmoid diverticulosis.    COLONOSCOPY  2019   Dr Anette   ESOPHAGOGASTRODUODENOSCOPY  06/08/2016   Distal esophageal stricture status post esophageal dilatation. Presbtesophagus, also suggestive of motility disorder. Mild gastritis.    evacuation thrombosed external hemorrhoid  2021   HERNIA REPAIR  2023   KNEE SURGERY Right    LUMBAR LAMINECTOMY  10/08/2015   L3-4, Surgeon Alm Fairy Pack, MD   NASAL SINUS SURGERY     HAS HAD 2 OR 3.   LAST ONE 2011, IN [place]  (LEFT NASAL PASSAGE IS NARROWER THAN RIGHT   OTHER SURGICAL HISTORY  2023   Surgery for diverticulitis   SACROILIAC JOINT INJECTION Bilateral 01/10/2016   TRIGGER FINGER RELEASE Left 2024   TRIGGER FINGER RELEASE Right 05/26/2024   Procedure: RELEASE, A1 PULLEY, FOR TRIGGER FINGER;  Surgeon: Murrell Drivers, MD;  Location: [facility];  Service: Orthopedics;  Laterality: Right;   UPPER GASTROINTESTINAL ENDOSCOPY      Review of systems negative except as noted in HPI / PMHx or noted below:  Review of Systems  Constitutional: Negative.   HENT: Negative.    Eyes: Negative.   Respiratory: Negative.    Cardiovascular: Negative.   Gastrointestinal: Negative.   Genitourinary: Negative.   Musculoskeletal: Negative.   Skin: Negative.   Neurological: Negative.   Endo/Heme/Allergies: Negative.    Psychiatric/Behavioral: Negative.       Objective:   Vitals:   07/03/24 1602  BP: 110/76  Pulse: (!) 112  Resp: 16  SpO2: 95%   Height: 5' 9.5 (176.5 cm)  Weight: 250 lb (113.4 kg)   Physical Exam Constitutional:      Appearance: He is not diaphoretic.  HENT:     Head: Normocephalic.     Right Ear: Tympanic membrane, ear canal and external ear normal.     Left Ear: Tympanic membrane, ear canal and external ear normal.     Nose: No mucosal edema (septal perf) or rhinorrhea.     Mouth/Throat:     Pharynx: Uvula midline. No oropharyngeal exudate.  Eyes:     Conjunctiva/sclera: Conjunctivae normal.  Neck:     Thyroid : No thyromegaly.     Trachea: Trachea normal. No tracheal tenderness or tracheal deviation.  Cardiovascular:     Rate and Rhythm: Normal rate and regular rhythm.     Heart sounds: Normal heart sounds, S1 normal and S2 normal. No murmur heard. Pulmonary:     Effort: No respiratory distress.     Breath sounds: Normal breath sounds. No stridor. No wheezing or rales.  Lymphadenopathy:     Head:     Right side of head: No tonsillar adenopathy.     Left side of head: No tonsillar adenopathy.     Cervical: No cervical adenopathy.  Skin:    Findings: No erythema or rash.     Nails: There is no clubbing.  Neurological:     Mental Status: He is alert.     Diagnostics: Results of blood test obtained 25 October 2023 identifies IgG 1036 mg/DL, IgM 16 mg/DL, IgA less than 5 mg/DL  Results of a MRI head obtained 07 January 2024 identified clear paranasal sinuses  Assessment and Plan:   1. CVID (common variable immunodeficiency) (HCC)   2. Asthma, moderate persistent, well-controlled   3. Nasal septal perforation   4. Gastroesophageal reflux disease, unspecified whether esophagitis present    1.  Continue immunoglobulin infusions   2.  Start azithromycin  250 - 1 tablet 3 times per week  3.  Start Xhance  - 2 sprays each nostril 2 times per day  4.  If  needed:   A.  Nasal saline  B.  AirSupra  -2 inhalations every 4-6 hours   C.  Systane eye drops  5.  Continue to treat reflux with the following:   A.  Omeprazole  40 mg - 1 tablet 1-2 times per day   B.  Famotidine  40 mg - 1 tablet 2 times per day  C.  Magnesium based antacids  6. Return to clinic in December 2025 or earlier if problem  7. Influenza = tamiflu. Covid = paxlovid  8. Obtain fall flu vaccine  I have started Dave on azithromycin  3 times a week to hopefully help with his recurrent upper respiratory tract infections.  He very well could be having some low-grade bacterial growth recurrently and we will have him use this antibiotic in conjunction with his immunoglobulin fusions to help prevent infections.  And I have given him an anti-inflammatory medication for his upper airway as well using Xhance .  He will remain on therapy for reflux.  Will see him back in this clinic in December 2025 or earlier if there is a problem.  Camellia Denis, MD Allergy  / Immunology [facility]

## 2024-07-07 ENCOUNTER — Encounter: Payer: Self-pay | Admitting: Allergy and Immunology

## 2024-07-07 DIAGNOSIS — M1712 Unilateral primary osteoarthritis, left knee: Secondary | ICD-10-CM | POA: Diagnosis not present

## 2024-07-07 DIAGNOSIS — K219 Gastro-esophageal reflux disease without esophagitis: Secondary | ICD-10-CM | POA: Diagnosis not present

## 2024-07-08 DIAGNOSIS — R42 Dizziness and giddiness: Secondary | ICD-10-CM | POA: Diagnosis not present

## 2024-07-08 DIAGNOSIS — Z79899 Other long term (current) drug therapy: Secondary | ICD-10-CM | POA: Diagnosis not present

## 2024-07-08 DIAGNOSIS — G629 Polyneuropathy, unspecified: Secondary | ICD-10-CM | POA: Diagnosis not present

## 2024-07-08 DIAGNOSIS — G43909 Migraine, unspecified, not intractable, without status migrainosus: Secondary | ICD-10-CM | POA: Diagnosis not present

## 2024-07-08 DIAGNOSIS — M65331 Trigger finger, right middle finger: Secondary | ICD-10-CM | POA: Diagnosis not present

## 2024-07-16 ENCOUNTER — Encounter: Payer: Self-pay | Admitting: Otolaryngology

## 2024-07-18 ENCOUNTER — Encounter: Payer: Self-pay | Admitting: Allergy and Immunology

## 2024-07-22 DIAGNOSIS — R1310 Dysphagia, unspecified: Secondary | ICD-10-CM | POA: Diagnosis not present

## 2024-07-23 ENCOUNTER — Telehealth: Payer: Self-pay

## 2024-07-23 NOTE — Telephone Encounter (Signed)
 Patient called and states he is still very congested even after using Xhance  nasal spray. He wants to know if there is something else he can take.

## 2024-07-25 DIAGNOSIS — G43909 Migraine, unspecified, not intractable, without status migrainosus: Secondary | ICD-10-CM | POA: Diagnosis not present

## 2024-07-25 DIAGNOSIS — E785 Hyperlipidemia, unspecified: Secondary | ICD-10-CM | POA: Diagnosis not present

## 2024-07-25 DIAGNOSIS — K219 Gastro-esophageal reflux disease without esophagitis: Secondary | ICD-10-CM | POA: Diagnosis not present

## 2024-07-25 DIAGNOSIS — E1169 Type 2 diabetes mellitus with other specified complication: Secondary | ICD-10-CM | POA: Diagnosis not present

## 2024-07-25 NOTE — Telephone Encounter (Signed)
 Name of Caller/Relationship:  Tonny / patient  Medication:   Oxcarbazepine (Trileptal) 150 mg tablet, take 2 tablets by mouth daily at 8pm (He states he was told he could also do 1 tablet in am and 2 tablets at night)  His pharmacy is needing the new prescription with new directions so they can fill his bubble packs.  Pharmacy and location:   [facility], [address], [place]  Call back number is: [phone]   Patient aware to allow 48-72 hours for refill request.  How many pills do you have left:  out of the extra tablets he was given. He has enough to get him through until bubble pack arrives, however he will be short 2 tablets a day. Had been taking 1 tablet a day.   Last OV: 07/08/24   Upcoming OV: 11/17/24

## 2024-07-29 DIAGNOSIS — Z9181 History of falling: Secondary | ICD-10-CM | POA: Diagnosis not present

## 2024-07-29 DIAGNOSIS — Z0000 Encounter for general adult medical examination without abnormal findings: Secondary | ICD-10-CM | POA: Diagnosis not present

## 2024-07-29 NOTE — Telephone Encounter (Signed)
 Patient is wondering if Dr. Kozlow has a suggestion on an alternative for his nasal spray that will help with the congestion. He states he felt better on the antibiotic so he wanted to know if we could send more in.

## 2024-07-31 MED ORDER — AZITHROMYCIN 250 MG PO TABS: ORAL_TABLET | ORAL | 4 refills | Status: AC

## 2024-07-31 NOTE — Telephone Encounter (Signed)
 Rx sent to [facility] and patient informed

## 2024-07-31 NOTE — Addendum Note (Signed)
 Addended by: Delona Clasby on: 07/31/2024 08:41 AM   Modules accepted: Orders

## 2024-08-26 DIAGNOSIS — Z23 Encounter for immunization: Secondary | ICD-10-CM | POA: Diagnosis not present

## 2024-09-03 DIAGNOSIS — E119 Type 2 diabetes mellitus without complications: Secondary | ICD-10-CM | POA: Diagnosis not present

## 2024-09-03 DIAGNOSIS — Z980 Intestinal bypass and anastomosis status: Secondary | ICD-10-CM | POA: Diagnosis not present

## 2024-09-03 DIAGNOSIS — K5730 Diverticulosis of large intestine without perforation or abscess without bleeding: Secondary | ICD-10-CM | POA: Diagnosis not present

## 2024-09-03 DIAGNOSIS — K648 Other hemorrhoids: Secondary | ICD-10-CM | POA: Diagnosis not present

## 2024-09-03 DIAGNOSIS — R1310 Dysphagia, unspecified: Secondary | ICD-10-CM | POA: Diagnosis not present

## 2024-09-03 DIAGNOSIS — K644 Residual hemorrhoidal skin tags: Secondary | ICD-10-CM | POA: Diagnosis not present

## 2024-09-03 DIAGNOSIS — K219 Gastro-esophageal reflux disease without esophagitis: Secondary | ICD-10-CM | POA: Diagnosis not present

## 2024-09-03 DIAGNOSIS — K2960 Other gastritis without bleeding: Secondary | ICD-10-CM | POA: Diagnosis not present

## 2024-09-03 DIAGNOSIS — G4733 Obstructive sleep apnea (adult) (pediatric): Secondary | ICD-10-CM | POA: Diagnosis not present

## 2024-09-03 DIAGNOSIS — K31A11 Gastric intestinal metaplasia without dysplasia, involving the antrum: Secondary | ICD-10-CM | POA: Diagnosis not present

## 2024-09-03 DIAGNOSIS — K317 Polyp of stomach and duodenum: Secondary | ICD-10-CM | POA: Diagnosis not present

## 2024-09-03 DIAGNOSIS — Z860100 Personal history of colon polyps, unspecified: Secondary | ICD-10-CM | POA: Diagnosis not present

## 2024-09-03 DIAGNOSIS — K449 Diaphragmatic hernia without obstruction or gangrene: Secondary | ICD-10-CM | POA: Diagnosis not present

## 2024-09-03 DIAGNOSIS — D126 Benign neoplasm of colon, unspecified: Secondary | ICD-10-CM | POA: Diagnosis not present

## 2024-09-03 DIAGNOSIS — D122 Benign neoplasm of ascending colon: Secondary | ICD-10-CM | POA: Diagnosis not present

## 2024-09-05 ENCOUNTER — Telehealth: Payer: Self-pay | Admitting: Allergy and Immunology

## 2024-09-05 NOTE — Telephone Encounter (Signed)
 Please submit a PA for Xhance . Thanks!

## 2024-09-05 NOTE — Telephone Encounter (Addendum)
 Patient called with questions about his nasal sprays. He was requesting a refill for Xhance  and I informed him we sent it in to [facility]. He called [facility] and they informed him that the order was cancelled because it needed a PA and they do not do PA's. He asked if we had samples he could pick up.

## 2024-09-08 ENCOUNTER — Other Ambulatory Visit (HOSPITAL_COMMUNITY): Payer: Self-pay

## 2024-09-08 ENCOUNTER — Telehealth: Payer: Self-pay

## 2024-09-08 NOTE — Telephone Encounter (Signed)
**Note De-identified  [place] Obfuscation** Please advise 

## 2024-09-08 NOTE — Telephone Encounter (Signed)
*  [facility]  Pharmacy Patient Advocate Encounter   Received notification from Pt Calls Messages that prior authorization for Xhance  93MCG/ACT exhaler suspension   is required/requested.   Insurance verification completed.   The patient is insured through [facility].   Per test claim: PA required; PA submitted to above mentioned insurance via Latent Key/confirmation #/EOC [id number] Status is pending

## 2024-09-08 NOTE — Telephone Encounter (Signed)
 Xhance  is denied because it is not on your plan's Drug List (formulary). Medication authorization requires the following: (1) You need to try this covered drug: Mometasone nasal spray

## 2024-09-10 ENCOUNTER — Other Ambulatory Visit: Payer: Self-pay | Admitting: *Deleted

## 2024-09-10 MED ORDER — MOMETASONE FUROATE 50 MCG/ACT NA SUSP
NASAL | 5 refills | Status: AC
Start: 2024-09-10 — End: ?

## 2024-10-29 ENCOUNTER — Encounter: Payer: Self-pay | Admitting: Allergy and Immunology

## 2024-10-29 ENCOUNTER — Ambulatory Visit: Admitting: Allergy and Immunology

## 2024-10-29 VITALS — BP 94/58 | HR 104 | Resp 16

## 2024-10-29 DIAGNOSIS — D839 Common variable immunodeficiency, unspecified: Secondary | ICD-10-CM

## 2024-10-29 DIAGNOSIS — J4540 Moderate persistent asthma, uncomplicated: Secondary | ICD-10-CM | POA: Diagnosis not present

## 2024-10-29 DIAGNOSIS — K219 Gastro-esophageal reflux disease without esophagitis: Secondary | ICD-10-CM

## 2024-10-29 DIAGNOSIS — J3489 Other specified disorders of nose and nasal sinuses: Secondary | ICD-10-CM

## 2024-10-29 NOTE — Progress Notes (Unsigned)
 [place] - [place] - [place] - [place] - [place]   Follow-up Note  Referring Provider: Keren Vicenta BRAVO, MD Primary Provider: Keren Vicenta BRAVO, MD Date of Office Visit: 10/29/2024  Subjective:   Brandon Gallagher (DOB: Jul 17, 1961) is a 63 y.o. male who [facility] in re-evaluation of the following:  HPI: [name] returns to this clinic in evaluation of CVID, asthma, LPR.  I last saw him in this clinic 03 July 2024.  Overall he has done really well with his airway issue and has not required an antibiotic to treat an airway infection and has not required a systemic steroid to treat a inflammatory flareup of his airway.  He has definitely contracted several viral respiratory tract infections but they have been relatively short-lived.  Rarely does he use a rescue inhaler.  He does consistently uses Xhance  and he is using azithromycin  3 times per week.  He continues on his immunoglobulin infusions.  His reflux is under pretty good control at this point in time while using omeprazole  and famotidine .  He has obtained the flu vaccine this year.  Allergies as of 10/29/2024   No Known Allergies      Medication List    Accu-Chek Guide Me w/Device Kit 1 (one) Kit daily   Accu-Chek Guide test strip Generic drug: glucose blood 1 (one) Each in vitro daily   Airsupra  90-80 MCG/ACT Aero Generic drug: Albuterol -Budesonide  Inhale 2 puffs into the lungs every 4 (four) hours.   ALPRAZolam  0.5 MG tablet Commonly known as: XANAX    azithromycin  250 MG tablet Commonly known as: Zithromax  Take one tablet three times a week.   buPROPion 300 MG 24 hr tablet Commonly known as: WELLBUTRIN XL Take 300 mg by mouth daily.   CORICIDIN HBP PO Take by mouth as needed.   divalproex 500 MG DR tablet Commonly known as: DEPAKOTE Take 500 mg by mouth 2 (two) times daily.   EPINEPHrine  0.3 mg/0.3 mL Soaj injection Commonly known as:  EPI-PEN SMARTSIG:0.3 Milliliter(s) IM PRN   famotidine  40 MG tablet Commonly known as: PEPCID  Take one tablet by mouth one to two times daily as directed.   Farxiga 10 MG Tabs tablet Generic drug: dapagliflozin propanediol Take 10 mg by mouth daily.   Hizentra 10 GM/50ML Sosy Generic drug: Immune Globulin (Human) INFUSE 20G SUBCUTANEOUSLY EVERY OTHER WEEK   meclizine 25 MG tablet Commonly known as: ANTIVERT Take 25 mg by mouth 3 (three) times daily as needed.   metFORMIN 500 MG tablet Commonly known as: GLUCOPHAGE Take 500 mg by mouth 2 (two) times daily.   omeprazole  40 MG capsule Commonly known as: PRILOSEC Take 1 tablet 2 times per day   OXcarbazepine 150 MG tablet Commonly known as: TRILEPTAL Take by mouth.   rosuvastatin 5 MG tablet Commonly known as: CRESTOR SMARTSIG:1 Tablet(s) By Mouth Every Evening   tamsulosin  0.4 MG Caps capsule Commonly known as: FLOMAX  Take 0.4 mg by mouth in the morning and at bedtime.   topiramate 25 MG tablet Commonly known as: TOPAMAX Take 25 mg by mouth 2 (two) times daily.   venlafaxine  XR 150 MG 24 hr capsule Commonly known as: EFFEXOR -XR Take 150 mg by mouth daily.   Xhance  93 MCG/ACT Exhu Generic drug: Fluticasone  Propionate Use two puffs in each nostril twice daily as directed.    Past Medical History:  Diagnosis Date   Acute respiratory disease due to COVID-19 virus 10/30/2019   Acute respiratory failure with  hypoxia (HCC) 10/30/2019   ADHD (attention deficit hyperactivity disorder)    diagnosed as a teenager   Allergy     Anxiety    Arthritis    Asthma    Blood transfusion without reported diagnosis    Bronchitis    last had back in 2017   Sees no pulmonary md   Cervical spondylosis with radiculopathy 01/01/2017   Chronic bilateral low back pain without sciatica 01/10/2016   COPD (chronic obstructive pulmonary disease) (HCC)    Depression    Diabetes mellitus without complication (HCC) 2022   Diverticulitis  of sigmoid colon 02/05/2020   Diverticulosis    last flare up was in Jan. 2018  Was on course of antibiotics for this   Esophageal stricture    Esophagus disorder    narrowing of esophagus.  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 Shiela   Overview:  S/P minimally invasive lumbar laminectomy L3-4 performed on 10/08/2015 by Dr. Shiela    Sacroiliac joint pain 07/27/2016   Sinus bradycardia by electrocardiogram 10/30/2019   Sleep apnea    no CPAP- unsure if true diagnosis or not   Thrombosed external hemorrhoid     Past Surgical History:  Procedure Laterality Date   ANTERIOR CERVICAL DECOMP/DISCECTOMY FUSION N/A 01/01/2017   Procedure: Cervical four- five and Cervical six- seven Anterior cervical discectomy with fusion and plate fixation;  Surgeon: Morene Hicks Ditty, MD;  Location: [facility];  Service: Neurosurgery;  Laterality: N/A;  C4-5 and C6-7 Anterior cervical discectomy with fusion and plate fixation   BACK SURGERY     x 2    @ [facility]        Last one 09/2015   BACK SURGERY  2024   three surgeries   CARDIAC CATHETERIZATION     He thinks.   Not really sure where at, thought it was here @ [facility], but its been a LONG time ?2000   CARPAL TUNNEL RELEASE Bilateral    COLECTOMY     COLONOSCOPY  10/22/2017   Colonic polyp status post polypectomy. Moderate  predominantly sigmoid diverticulosis.    COLONOSCOPY  2019   Dr Anette   ESOPHAGOGASTRODUODENOSCOPY  06/08/2016   Distal esophageal stricture status post esophageal dilatation. Presbtesophagus, also suggestive of motility disorder. Mild gastritis.    evacuation thrombosed external hemorrhoid  2021   HERNIA REPAIR  2023   KNEE SURGERY Right    LUMBAR LAMINECTOMY  10/08/2015   L3-4, Surgeon Alm Fairy Shiela, MD   NASAL SINUS SURGERY     HAS HAD 2 OR 3.   LAST ONE 2011, IN [place]  (LEFT NASAL PASSAGE IS NARROWER THAN RIGHT   OTHER SURGICAL HISTORY  2023   Surgery for diverticulitis   SACROILIAC JOINT INJECTION Bilateral 01/10/2016   TRIGGER  FINGER RELEASE Left 2024   TRIGGER FINGER RELEASE Right 05/26/2024   Procedure: RELEASE, A1 PULLEY, FOR TRIGGER FINGER;  Surgeon: Murrell Drivers, MD;  Location: [facility];  Service: Orthopedics;  Laterality: Right;   UPPER GASTROINTESTINAL ENDOSCOPY      Review of systems negative except as noted in HPI / PMHx or noted below:  Review of Systems  Constitutional: Negative.   HENT: Negative.    Eyes: Negative.   Respiratory: Negative.    Cardiovascular: Negative.   Gastrointestinal: Negative.   Genitourinary: Negative.   Musculoskeletal: Negative.   Skin: Negative.   Neurological: Negative.   Endo/Heme/Allergies: Negative.   Psychiatric/Behavioral: Negative.       Objective:   Vitals:   10/29/24 1551  BP: (!) 94/58  Pulse: (!) 104  Resp: 16  SpO2: 96%          Physical Exam Constitutional:      Appearance: He is not diaphoretic.  HENT:     Head: Normocephalic.     Right Ear: Tympanic membrane, ear canal and external ear normal.     Left Ear: Tympanic membrane, ear canal and external ear normal.     Nose: Nose normal. No mucosal edema (Septal perforation) or rhinorrhea.     Mouth/Throat:     Pharynx: Uvula midline. No oropharyngeal exudate.  Eyes:     Conjunctiva/sclera: Conjunctivae normal.  Neck:      Thyroid : No thyromegaly.     Trachea: Trachea normal. No tracheal tenderness or tracheal deviation.  Cardiovascular:     Rate and Rhythm: Normal rate and regular rhythm.     Heart sounds: Normal heart sounds, S1 normal and S2 normal. No murmur heard. Pulmonary:     Effort: No respiratory distress.     Breath sounds: Normal breath sounds. No stridor. No wheezing or rales.  Lymphadenopathy:     Head:     Right side of head: No tonsillar adenopathy.     Left side of head: No tonsillar adenopathy.     Cervical: No cervical adenopathy.  Skin:    Findings: No erythema or rash.     Nails: There is no clubbing.  Neurological:     Mental Status: He is alert.     Diagnostics: none  Assessment and Plan:   1. CVID (common variable immunodeficiency) (HCC)   2. Asthma, moderate persistent, well-controlled   3. Nasal septal perforation   4. Gastroesophageal reflux disease, unspecified whether esophagitis present    1.  Continue immunoglobulin infusions. Check blood - IgA/G/M  2.  Continue azithromycin  250 - 1 tablet 3 times per week  3.  Continue Xhance  - 2 sprays each nostril 2 times per day  4.  If needed:   A.  Nasal saline  B.  AirSupra  -2 inhalations every 4-6 hours   C.  Systane eye drops  5.  Continue to treat reflux with the following:   A.  Omeprazole  40 mg - 1 tablet 1-2 times per day   B.  Famotidine  40 mg - 1 tablet 2 times per day  C.  Magnesium based antacids  6. Return to clinic in 6 months or earlier if problem  7. Influenza = tamiflu. Covid = paxlovid  [name] appears to be doing okay at this point.  We will check his immunoglobulin levels and he will continue on immunoglobulin infusions and will continue on azithromycin  3 times per week and use Xhance  for his upper airway disease.  Other than having viral  infections he has not had any bacterial infections.  His reflux appears to be doing pretty well at this point on his current therapy and he will continue on  omeprazole  and famotidine .  If he does well we will see him back in this clinic in 6 months or earlier if there is a problem.  Camellia Denis, MD Allergy  / Immunology [facility]

## 2024-10-29 NOTE — Patient Instructions (Addendum)
°  1.  Continue immunoglobulin infusions. Check blood - IgA/G/M  2.  Continue azithromycin  250 - 1 tablet 3 times per week  3.  Continue Xhance  - 2 sprays each nostril 2 times per day  4.  If needed:   A.  Nasal saline  B.  AirSupra  -2 inhalations every 4-6 hours   C.  Systane eye drops  5.  Continue to treat reflux with the following:   A.  Omeprazole  40 mg - 1 tablet 1-2 times per day   B.  Famotidine  40 mg - 1 tablet 2 times per day  C.  Magnesium based antacids  6. Return to clinic in 6 months or earlier if problem  7. Influenza = tamiflu. Covid = paxlovid

## 2024-10-30 ENCOUNTER — Encounter: Payer: Self-pay | Admitting: Allergy and Immunology

## 2024-11-15 LAB — IGG, IGA, IGM
IgG (Immunoglobin G), Serum: 1005 mg/dL (ref 603–1613)
IgM (Immunoglobulin M), Srm: 16 mg/dL — ABNORMAL LOW (ref 20–172)
Immunoglobulin A, (IgA) QN, Serum: <5 mg/dL — ABNORMAL LOW (ref 61–437)

## 2024-11-17 ENCOUNTER — Ambulatory Visit: Payer: Self-pay | Admitting: Allergy and Immunology

## 2024-11-25 ENCOUNTER — Other Ambulatory Visit: Payer: Self-pay | Admitting: *Deleted

## 2024-11-25 ENCOUNTER — Telehealth: Payer: Self-pay | Admitting: Allergy and Immunology

## 2024-11-25 MED ORDER — MOMETASONE FUROATE 50 MCG/ACT NA SUSP: NASAL | 5 refills | Status: AC

## 2024-11-25 NOTE — Telephone Encounter (Signed)
 Patient doesn't feel that the azithromycin  is helping. He is still stuffy, has a runny and bloody nose, and headaches. He wants to know if there is anything else he is able to do.

## 2024-11-25 NOTE — Telephone Encounter (Signed)
 Please advise

## 2024-11-28 ENCOUNTER — Telehealth: Payer: Self-pay | Admitting: Allergy and Immunology

## 2024-11-28 NOTE — Telephone Encounter (Signed)
 Patient wanted to know if Dr. Kozlow could take over his immunoglobulin infusions. He states he's had some issues regarding this with Dr. Mardee. Since Dr. Kozlow orders some of his labs, he would like to just have it done through us  if possible. I did inform patient Dr. Maurilio is back in office on Monday and we will get back to him then. He is OK on his infusion script for a couple of weeks.

## 2024-12-02 NOTE — Telephone Encounter (Signed)
 Called [facility] and for unknown reason Dr Mardee took over rx for Hizentra in 2024. Had orders faxed to clinic and  returned to them but was unable to make contact with patient to advise phone number not working

## 2025-04-29 ENCOUNTER — Ambulatory Visit: Admitting: Allergy and Immunology
